# Patient Record
Sex: Female | Born: 1960
Health system: Southern US, Community
[De-identification: ages and names within clinical notes are randomized; demographics above are authoritative.]

## PROBLEM LIST (undated history)

## (undated) DIAGNOSIS — T7840XA Allergy, unspecified, initial encounter: Secondary | ICD-10-CM

## (undated) DIAGNOSIS — H269 Unspecified cataract: Secondary | ICD-10-CM

## (undated) DIAGNOSIS — E785 Hyperlipidemia, unspecified: Secondary | ICD-10-CM

## (undated) DIAGNOSIS — G43D0 Abdominal migraine, not intractable: Secondary | ICD-10-CM

## (undated) DIAGNOSIS — M1990 Unspecified osteoarthritis, unspecified site: Secondary | ICD-10-CM

## (undated) DIAGNOSIS — R7303 Prediabetes: Secondary | ICD-10-CM

## (undated) DIAGNOSIS — D649 Anemia, unspecified: Secondary | ICD-10-CM

## (undated) DIAGNOSIS — G43909 Migraine, unspecified, not intractable, without status migrainosus: Secondary | ICD-10-CM

## (undated) DIAGNOSIS — M7710 Lateral epicondylitis, unspecified elbow: Secondary | ICD-10-CM

## (undated) DIAGNOSIS — Z889 Allergy status to unspecified drugs, medicaments and biological substances status: Secondary | ICD-10-CM

## (undated) DIAGNOSIS — H8103 Meniere's disease, bilateral: Secondary | ICD-10-CM

## (undated) DIAGNOSIS — K219 Gastro-esophageal reflux disease without esophagitis: Secondary | ICD-10-CM

## (undated) DIAGNOSIS — R011 Cardiac murmur, unspecified: Secondary | ICD-10-CM

## (undated) HISTORY — DX: Anemia, unspecified: D64.9

## (undated) HISTORY — PX: BREAST CYST EXCISION: SHX579

## (undated) HISTORY — DX: Gastro-esophageal reflux disease without esophagitis: K21.9

## (undated) HISTORY — PX: COLONOSCOPY: SHX174

## (undated) HISTORY — DX: Unspecified osteoarthritis, unspecified site: M19.90

## (undated) HISTORY — DX: Abdominal migraine, not intractable: G43.D0

## (undated) HISTORY — DX: Lateral epicondylitis, unspecified elbow: M77.10

## (undated) HISTORY — DX: Prediabetes: R73.03

## (undated) HISTORY — DX: Migraine, unspecified, not intractable, without status migrainosus: G43.909

## (undated) HISTORY — DX: Hyperlipidemia, unspecified: E78.5

## (undated) HISTORY — DX: Meniere's disease, bilateral: H81.03

## (undated) HISTORY — DX: Allergy, unspecified, initial encounter: T78.40XA

## (undated) HISTORY — DX: Cardiac murmur, unspecified: R01.1

## (undated) HISTORY — DX: Allergy status to unspecified drugs, medicaments and biological substances: Z88.9

## (undated) HISTORY — DX: Unspecified cataract: H26.9

---

## 1992-01-09 HISTORY — PX: BREAST BIOPSY: SHX20

## 1997-07-27 ENCOUNTER — Other Ambulatory Visit: Admission: RE | Admit: 1997-07-27 | Discharge: 1997-07-27 | Payer: Self-pay | Admitting: Obstetrics & Gynecology

## 1998-08-23 ENCOUNTER — Other Ambulatory Visit: Admission: RE | Admit: 1998-08-23 | Discharge: 1998-08-23 | Payer: Self-pay | Admitting: Obstetrics & Gynecology

## 1999-08-29 ENCOUNTER — Other Ambulatory Visit: Admission: RE | Admit: 1999-08-29 | Discharge: 1999-08-29 | Payer: Self-pay | Admitting: Obstetrics & Gynecology

## 2000-08-28 ENCOUNTER — Other Ambulatory Visit: Admission: RE | Admit: 2000-08-28 | Discharge: 2000-08-28 | Payer: Self-pay | Admitting: Obstetrics & Gynecology

## 2001-09-01 ENCOUNTER — Other Ambulatory Visit: Admission: RE | Admit: 2001-09-01 | Discharge: 2001-09-01 | Payer: Self-pay | Admitting: Obstetrics & Gynecology

## 2002-09-15 ENCOUNTER — Other Ambulatory Visit: Admission: RE | Admit: 2002-09-15 | Discharge: 2002-09-15 | Payer: Self-pay | Admitting: Obstetrics & Gynecology

## 2003-09-16 ENCOUNTER — Other Ambulatory Visit: Admission: RE | Admit: 2003-09-16 | Discharge: 2003-09-16 | Payer: Self-pay | Admitting: Obstetrics & Gynecology

## 2004-06-06 ENCOUNTER — Emergency Department (HOSPITAL_COMMUNITY): Admission: EM | Admit: 2004-06-06 | Discharge: 2004-06-06 | Payer: Self-pay | Admitting: *Deleted

## 2004-06-08 ENCOUNTER — Ambulatory Visit: Payer: Self-pay | Admitting: Internal Medicine

## 2004-06-13 ENCOUNTER — Ambulatory Visit: Payer: Self-pay | Admitting: Internal Medicine

## 2004-06-14 ENCOUNTER — Encounter: Admission: RE | Admit: 2004-06-14 | Discharge: 2004-06-14 | Payer: Self-pay | Admitting: Internal Medicine

## 2004-09-19 ENCOUNTER — Other Ambulatory Visit: Admission: RE | Admit: 2004-09-19 | Discharge: 2004-09-19 | Payer: Self-pay | Admitting: Obstetrics & Gynecology

## 2005-03-07 ENCOUNTER — Ambulatory Visit: Payer: Self-pay | Admitting: Internal Medicine

## 2005-11-14 ENCOUNTER — Ambulatory Visit: Payer: Self-pay | Admitting: Internal Medicine

## 2006-02-26 ENCOUNTER — Ambulatory Visit: Payer: Self-pay | Admitting: Internal Medicine

## 2006-04-09 ENCOUNTER — Ambulatory Visit: Payer: Self-pay | Admitting: Internal Medicine

## 2006-08-20 DIAGNOSIS — J309 Allergic rhinitis, unspecified: Secondary | ICD-10-CM | POA: Insufficient documentation

## 2006-08-20 DIAGNOSIS — E785 Hyperlipidemia, unspecified: Secondary | ICD-10-CM | POA: Insufficient documentation

## 2006-10-11 ENCOUNTER — Ambulatory Visit: Payer: Self-pay | Admitting: Internal Medicine

## 2007-01-13 ENCOUNTER — Telehealth: Payer: Self-pay | Admitting: Internal Medicine

## 2008-06-10 ENCOUNTER — Ambulatory Visit: Payer: Self-pay | Admitting: Family Medicine

## 2008-06-10 LAB — CONVERTED CEMR LAB
Specific Gravity, Urine: 1.02
WBC Urine, dipstick: NEGATIVE
pH: 6

## 2008-06-11 ENCOUNTER — Ambulatory Visit: Payer: Self-pay | Admitting: Cardiology

## 2008-06-11 LAB — CONVERTED CEMR LAB
Eosinophils Relative: 1.9 % (ref 0.0–5.0)
HCT: 37.1 % (ref 36.0–46.0)
Lymphocytes Relative: 31.6 % (ref 12.0–46.0)
MCV: 90.6 fL (ref 78.0–100.0)
Monocytes Relative: 5.5 % (ref 3.0–12.0)
Neutrophils Relative %: 60.4 % (ref 43.0–77.0)
Platelets: 214 10*3/uL (ref 150.0–400.0)
RDW: 12.5 % (ref 11.5–14.6)

## 2008-06-18 ENCOUNTER — Ambulatory Visit: Payer: Self-pay | Admitting: Internal Medicine

## 2008-08-09 ENCOUNTER — Ambulatory Visit: Payer: Self-pay | Admitting: Family Medicine

## 2008-08-09 DIAGNOSIS — F4323 Adjustment disorder with mixed anxiety and depressed mood: Secondary | ICD-10-CM | POA: Insufficient documentation

## 2008-10-07 LAB — CONVERTED CEMR LAB: Pap Smear: NORMAL

## 2008-10-17 ENCOUNTER — Telehealth: Payer: Self-pay | Admitting: Family Medicine

## 2008-10-27 ENCOUNTER — Ambulatory Visit: Payer: Self-pay | Admitting: Internal Medicine

## 2008-10-27 LAB — CONVERTED CEMR LAB
ALT: 21 units/L (ref 0–35)
AST: 20 units/L (ref 0–37)
Albumin: 3.8 g/dL (ref 3.5–5.2)
CO2: 28 meq/L (ref 19–32)
Calcium: 9 mg/dL (ref 8.4–10.5)
Chloride: 102 meq/L (ref 96–112)
Cholesterol: 255 mg/dL — ABNORMAL HIGH (ref 0–200)
Eosinophils Absolute: 0.2 10*3/uL (ref 0.0–0.7)
Glucose, Bld: 83 mg/dL (ref 70–99)
Glucose, Urine, Semiquant: NEGATIVE
HCT: 38.8 % (ref 36.0–46.0)
MCHC: 33.8 g/dL (ref 30.0–36.0)
MCV: 92.4 fL (ref 78.0–100.0)
Monocytes Absolute: 0.5 10*3/uL (ref 0.1–1.0)
Nitrite: NEGATIVE
Platelets: 286 10*3/uL (ref 150.0–400.0)
RBC: 4.2 M/uL (ref 3.87–5.11)
RDW: 12.3 % (ref 11.5–14.6)
Specific Gravity, Urine: 1.015
TSH: 1.22 microintl units/mL (ref 0.35–5.50)
Urobilinogen, UA: 0.2
WBC Urine, dipstick: NEGATIVE
WBC: 7.8 10*3/uL (ref 4.5–10.5)

## 2008-11-03 ENCOUNTER — Ambulatory Visit: Payer: Self-pay | Admitting: Internal Medicine

## 2009-03-09 ENCOUNTER — Ambulatory Visit: Payer: Self-pay | Admitting: Internal Medicine

## 2009-03-09 LAB — CONVERTED CEMR LAB
ALT: 23 units/L (ref 0–35)
AST: 25 units/L (ref 0–37)
HDL: 67.8 mg/dL (ref >39.00)
Total Bilirubin: 0.5 mg/dL (ref 0.3–1.2)
VLDL: 49.4 mg/dL — ABNORMAL HIGH (ref 0.0–40.0)

## 2009-03-16 ENCOUNTER — Ambulatory Visit: Payer: Self-pay | Admitting: Internal Medicine

## 2009-05-11 ENCOUNTER — Telehealth: Payer: Self-pay | Admitting: Internal Medicine

## 2009-05-11 DIAGNOSIS — M545 Low back pain, unspecified: Secondary | ICD-10-CM | POA: Insufficient documentation

## 2010-02-07 NOTE — Assessment & Plan Note (Signed)
Summary: ROA X 3 MTHS / RS/PT RSC/CJR   Vital Signs:  Patient profile:   50 year old female Height:      68 inches Weight:      196 pounds BMI:     29.91 Temp:     98.2 degrees F oral Pulse rate:   72 / minute Resp:     12 per minute BP sitting:   116 / 78  (left arm)  Vitals Entered By: Willy Eddy, LPN (March 16, 452 9:51 AM)  Nutrition Counseling: Patient's BMI is greater than 25 and therefore counseled on weight management options. CC: roa labs after starting livalo 4 mg   CC:  roa labs after starting livalo 4 mg.  History of Present Illness: discussion of bmi GOAL DIET AND EXERCIZE GOAL I have spent greater that 30 min face to face evaluating this patient    Hyperlipidemia Follow-Up      This is a 50 year old woman who presents for Hyperlipidemia follow-up.  The patient denies muscle aches, GI upset, abdominal pain, flushing, itching, constipation, diarrhea, and fatigue.  The patient denies the following symptoms: chest pain/pressure, exercise intolerance, dypsnea, palpitations, syncope, and pedal edema.  Compliance with medications (by patient report) has been near 100%.  Dietary compliance has been excellent.  The patient reports exercising daily.    Preventive Screening-Counseling & Management  Alcohol-Tobacco     Smoking Status: quit  Problems Prior to Update: 1)  Preventive Health Care  (ICD-V70.0) 2)  Family History Breast Cancer 1st Degree Relative <50  (ICD-V16.3) 3)  Anxiety, Situational  (ICD-308.3) 4)  Abdominal Pain, Right Lower Quadrant  (ICD-789.03) 5)  Hyperlipidemia  (ICD-272.4) 6)  Allergic Rhinitis  (ICD-477.9)  Medications Prior to Update: 1)  Claritin 10 Mg  Tabs (Loratadine) .... Take 1 Tablet By Mouth Once A Day 2)  Ortho Tri-Cyclen Lo 0.18/0.215/0.25 Mg-25 Mcg Tabs (Norgestim-Eth Estrad Triphasic) .... Use As Directed 3)  Protopic 0.1 % Oint (Tacrolimus) .... Use As Directed 4)  Livalo 4 Mg Tabs (Pitavastatin Calcium) .... One By  Mouth Every Friday Night  Current Medications (verified): 1)  Claritin 10 Mg  Tabs (Loratadine) .... Take 1 Tablet By Mouth Once A Day 2)  Ortho Tri-Cyclen Lo 0.18/0.215/0.25 Mg-25 Mcg Tabs (Norgestim-Eth Estrad Triphasic) .... Use As Directed 3)  Protopic 0.1 % Oint (Tacrolimus) .... Use As Directed 4)  Livalo 4 Mg Tabs (Pitavastatin Calcium) .... One By Mouth Every Friday Night 5)  Womens Multivitamin Plus  Tabs (Multiple Vitamins-Minerals) .Marland Kitchen.. 1 Once Daily 6)  Aspir-Low 81 Mg Tbec (Aspirin) .Marland Kitchen.. 1 Once Daily  Allergies (verified): No Known Drug Allergies  Past History:  Family History: Last updated: November 30, 2008 father died of Cancer mother and Kris Mouton mothere has breast cancer Family History Breast cancer 1st degree relative <50 Family History High cholesterol Family Hsitory Headaches  Social History: Last updated: Nov 30, 2008 Former Smoker  quit Jan 09 1995 Regular exercise-yes  Risk Factors: Exercise: yes (11-30-2008)  Risk Factors: Smoking Status: quit (03/16/2009)  Past medical, surgical, family and social histories (including risk factors) reviewed, and no changes noted (except as noted below).  Past Medical History: Reviewed history from 08/20/2006 and no changes required. Allergic rhinitis Hyperlipidemia  Family History: Reviewed history from 30-Nov-2008 and no changes required. father died of Cancer mother and Cyril Mourning has breast cancer Family History Breast cancer 1st degree relative <50 Family History High cholesterol Family Hsitory Headaches  Social History: Reviewed history from Nov 30, 2008 and no changes  required. Former Smoker  quit Jan 09 1995 Regular exercise-yes  Review of Systems  The patient denies anorexia, fever, weight loss, weight gain, vision loss, decreased hearing, hoarseness, chest pain, syncope, dyspnea on exertion, peripheral edema, prolonged cough, headaches, hemoptysis, abdominal pain, melena, hematochezia, severe  indigestion/heartburn, hematuria, incontinence, genital sores, muscle weakness, suspicious skin lesions, transient blindness, difficulty walking, depression, unusual weight change, abnormal bleeding, enlarged lymph nodes, angioedema, and breast masses.    Physical Exam  General:  alert, well-developed, and overweight-appearing.   Head:  normocephalic and atraumatic.  normocephalic and no abnormalities observed.   Eyes:  pupils equal and pupils round.   Ears:  R ear normal and L ear normal.   Nose:  no external deformity and no nasal discharge.   Mouth:  pharynx pink and moist and no erythema.   Neck:  No deformities, masses, or tenderness noted. Lungs:  Normal respiratory effort, chest expands symmetrically. Lungs are clear to auscultation, no crackles or wheezes. Heart:  Normal rate and regular rhythm. S1 and S2 normal without gallop, murmur, click, rub or other extra sounds. Abdomen:  tender along the area of the rectus abdominus and the seratus with palpable tenderness PERSISTANT Msk:  No deformity or scoliosis noted of thoracic or lumbar spine.   Pulses:  R and L carotid,radial,femoral,dorsalis pedis and posterior tibial pulses are full and equal bilaterally Extremities:  No clubbing, cyanosis, edema, or deformity noted with normal full range of motion of all joints.   Neurologic:  No cranial nerve deficits noted. Station and gait are normal. Plantar reflexes are down-going bilaterally. DTRs are symmetrical throughout. Sensory, motor and coordinative functions appear intact.   Impression & Recommendations:  Problem # 1:  HYPERLIPIDEMIA (ICD-272.4) I have spent greater that 30 min face to face evaluating this patient SET GOALS FOR WEIGHT, LIPIDS AND DICUSSED THE CONTINUATION OF THE LIVALO UNTIL THIS GOALS ARE REACHED Her updated medication list for this problem includes:    Livalo 4 Mg Tabs (Pitavastatin calcium) ..... One by mouth every friday night  Labs Reviewed: SGOT: 25  (03/09/2009)   SGPT: 23 (03/09/2009)   HDL:67.80 (03/09/2009), 48.90 (10/27/2008)  Chol:230 (03/09/2009), 255 (10/27/2008)  Trig:247.0 (03/09/2009), 296.0 (10/27/2008)  Problem # 2:  ABDOMINAL PAIN, RIGHT LOWER QUADRANT (ICD-789.03)  MODERATE  DIRECT HERNIA  Discussed symptom control with the patient.   Complete Medication List: 1)  Claritin 10 Mg Tabs (Loratadine) .... Take 1 tablet by mouth once a day 2)  Ortho Tri-cyclen Lo 0.18/0.215/0.25 Mg-25 Mcg Tabs (Norgestim-eth estrad triphasic) .... Use as directed 3)  Protopic 0.1 % Oint (Tacrolimus) .... Use as directed 4)  Livalo 4 Mg Tabs (Pitavastatin calcium) .... One by mouth every friday night 5)  Womens Multivitamin Plus Tabs (Multiple vitamins-minerals) .Marland Kitchen.. 1 once daily 6)  Aspir-low 81 Mg Tbec (Aspirin) .Marland Kitchen.. 1 once daily  Patient Instructions: 1)  Please schedule a follow-up appointment in 3 months. 2)  Hepatic Panel prior to visit, ICD-9:995.20 3)  Lipid Panel prior to visit, ICD-9:272.4

## 2010-02-07 NOTE — Progress Notes (Signed)
Summary: Pt having possible side effects from Livalo  Phone Note Call from Patient Call back at Home Phone [phone]   Caller: Patient Reason for Call: Lab or Test Results Summary of Call: Pt wants a call back to discuss potential side effects of Livalo. Pt is having some muscle soreness.  Please call asap today.  Initial call taken by: Lucy Antigua,  May 11, 2009 9:29 AM  Follow-up for Phone Call        Livilo 4 mg on Fridays.......is having muscle soreness (upper thighs).  Lower back is painful at times.  Occ soreness in arms and legs.  Stopped Livilo 2 weeks ago, but is no better. Follow-up by: Lynann Beaver CMA,  May 11, 2009 9:43 AM  Additional Follow-up for Phone Call Additional follow up Details #1::        per dr Lovell Sheehan- if she stopped 2 weeks ago , and still has symptoms, it is not the livila so she can start back- send for ls spine xrays and may have flexeril 10 1 two times a day as needed muscle spasm #30 Additional Follow-up by: Willy Eddy, LPN,  May 12, 2954 9:59 AM  New Problems: BACK PAIN, LUMBAR (ICD-724.2)   New Problems: BACK PAIN, LUMBAR (ICD-724.2) New/Updated Medications: FLEXERIL 10 MG TABS (CYCLOBENZAPRINE HCL) one by mouth two times a day as needed muscle pain Prescriptions: FLEXERIL 10 MG TABS (CYCLOBENZAPRINE HCL) one by mouth two times a day as needed muscle pain  #30 x 0   Entered by:   Lynann Beaver CMA   Authorized by:   Stacie Glaze MD   Signed by:   Lynann Beaver CMA on 05/11/2009   Method used:   Electronically to        [id number] (retail)       [address]       [place], [place]  [address]       Ph: [phone] or [phone]       Fax: [phone]   RxID:   [id number]  Pt notified.

## 2010-02-07 NOTE — Progress Notes (Signed)
Summary: flexeril  Phone Note Call from Patient   Caller: Patient Call For: Stacie Glaze MD Summary of Call: Pt wants to know if she can try the Flexeril and see if it helps before the xrays. [phone] Initial call taken by: Lynann Beaver CMA,  May 11, 2009 11:53 AM  Follow-up for Phone Call        yes - may try flexeril first but let us know if she wants [place] to schedule ls spine xray again per dr Lovell Sheehan Follow-up by: Willy Eddy, LPN,  May 12, 4538 12:13 PM  Additional Follow-up for Phone Call Additional follow up Details #1::        Phone Call Completed Additional Follow-up by: Rudy Jew, RN,  May 11, 2009 12:16 PM

## 2010-02-17 ENCOUNTER — Other Ambulatory Visit: Payer: Self-pay

## 2010-02-24 ENCOUNTER — Encounter: Payer: Self-pay | Admitting: Internal Medicine

## 2011-03-23 ENCOUNTER — Ambulatory Visit: Payer: Self-pay | Admitting: Licensed Clinical Social Worker

## 2011-06-28 ENCOUNTER — Ambulatory Visit (INDEPENDENT_AMBULATORY_CARE_PROVIDER_SITE_OTHER): Payer: BC Managed Care – PPO | Admitting: Family Medicine

## 2011-06-28 ENCOUNTER — Encounter: Payer: Self-pay | Admitting: Family Medicine

## 2011-06-28 VITALS — BP 120/70 | Temp 98.5°F | Wt 211.0 lb

## 2011-06-28 DIAGNOSIS — G44209 Tension-type headache, unspecified, not intractable: Secondary | ICD-10-CM

## 2011-06-28 MED ORDER — CYCLOBENZAPRINE HCL 5 MG PO TABS: 5.0000 mg | ORAL_TABLET | Freq: Three times a day (TID) | ORAL | Status: AC | PRN

## 2011-06-28 NOTE — Progress Notes (Signed)
  Subjective:    Patient ID: Heather Chen, female    DOB: [birth year], 51 y.o.   MRN: [id number]  HPI  Headaches as last Friday. Patient has remote history of migraines but this headache is different. Location is frontal to occipital and bilateral. Mostly dull ache. Mild to moderate severity. Generally sleeping okay. Denies any sinus symptoms. No fever. No focal neurologic symptoms. She's taken Advil without much relief. Headaches generally built through the day. She denies any specific new stressors.  No past medical history on file. No past surgical history on file.  reports that she quit smoking about 16 years ago. Her smoking use included Cigarettes. She has a 3 pack-year smoking history. She does not have any smokeless tobacco history on file. Her alcohol and drug histories not on file. family history is not on file. Allergies  Allergen Reactions  . Neomycin     rash      Review of Systems  Constitutional: Negative for fever, chills, appetite change and unexpected weight change.  Neurological: Positive for headaches. Negative for dizziness, seizures, syncope and weakness.  Hematological: Negative for adenopathy.  Psychiatric/Behavioral: Negative for confusion.       Objective:   Physical Exam  Constitutional: She is oriented to person, place, and time. She appears well-developed and well-nourished.  HENT:  Head: Normocephalic and atraumatic.  Right Ear: External ear normal.  Left Ear: External ear normal.  Neck: Neck supple. No thyromegaly present.  Cardiovascular: Normal rate and regular rhythm.   Pulmonary/Chest: Effort normal and breath sounds normal. No respiratory distress. She has no wheezes. She has no rales.  Lymphadenopathy:    She has no cervical adenopathy.  Neurological: She is alert and oriented to person, place, and time. No cranial nerve deficit. Coordination normal.  Psychiatric: She has a normal mood and affect. Her behavior is normal.            Assessment & Plan:  Headache. Suspect muscle contraction headache. Aerobic exercise as tolerated. Consider alternating heat and ice neck and upper back muscles. Local muscle massage. Low-dose cyclobenzaprine 5 mg each bedtime and follow up with primary in 1-2 weeks if no better and sooner as needed

## 2011-06-28 NOTE — Patient Instructions (Addendum)
Tension Headache (Muscle Contraction Headache) Tension headache is one of the most common causes of head pain. These headaches are usually felt as a pain over the top of your head and back of your neck. Stress, anxiety, and depression are common triggers for these headaches. Tension headaches are not life-threatening and will not lead to other types of headaches. Tension headaches can often be diagnosed by taking a history from the patient and a physical exam. Sometimes, further lab and x-ray studies are used to confirm the diagnosis. Your caregiver can advise you on how to get help solving problems that cause anxiety or stress. Antidepressants can be prescribed if depression is a problem. HOME CARE INSTRUCTIONS   If testing was done, call for your results. Remember, it is your responsibility to get the results of all testing. Do not assume everything is fine because you do not hear from your caregiver.   Only take over-the-counter or prescription medicines for pain, discomfort, or fever as directed by your caregiver.   Biofeedback, massage, or other relaxation techniques may be helpful.   Ice packs or heat to the head and neck can be used. Use these three to four times per day or as needed.   Physical therapy may be a useful addition to treatment.   If headaches continue, even with therapy, you may need to think about lifestyle changes.   Avoid excessive use of pain killers, as rebound headaches can occur.  SEEK MEDICAL CARE IF:   You develop problems with medications prescribed.   You do not respond or get no relief from medications.   You have a change from the usual headache.   You develop nausea (feeling sick to your stomach) or vomiting.  SEEK IMMEDIATE MEDICAL CARE IF:   Your headache becomes severe.   You have an unexplained oral temperature above 102 F (38.9 C).   You develop a stiff neck.   You have loss of vision.   You have muscular weakness.   You have loss of  muscular control.   You develop severe symptoms different from your first symptoms.   You start losing your balance or have trouble walking.   You feel faint or pass out.  MAKE SURE YOU:   Understand these instructions.   Will watch your condition.   Will get help right away if you are not doing well or get worse.  Document Released: 12/25/2004 Document Revised: 12/14/2010 Document Reviewed: 08/14/2007 Exit[employer] Patient Information 2012 [employer]. 

## 2012-01-28 ENCOUNTER — Other Ambulatory Visit: Payer: Self-pay | Admitting: Internal Medicine

## 2012-01-28 ENCOUNTER — Telehealth: Payer: Self-pay | Admitting: Internal Medicine

## 2012-01-28 DIAGNOSIS — Z139 Encounter for screening, unspecified: Secondary | ICD-10-CM

## 2012-01-28 NOTE — Telephone Encounter (Signed)
Left message on machine Will send for colonoscopy (screening )--and dr Lovell Sheehan has closed his practice to new pts

## 2012-01-28 NOTE — Telephone Encounter (Signed)
Pt requesting:  1. Order for colonoscopy 2. Would like to know if Dr. Lovell Sheehan would accept her husband [name] as a new patient.

## 2012-01-30 ENCOUNTER — Encounter: Payer: Self-pay | Admitting: Internal Medicine

## 2012-03-17 ENCOUNTER — Telehealth: Payer: Self-pay | Admitting: Internal Medicine

## 2012-03-17 NOTE — Telephone Encounter (Signed)
Pt would like cpx before aug 2014. Pt would like to see Orvan Falconer for cpx if possible.Can I sch?

## 2012-03-17 NOTE — Telephone Encounter (Signed)
Reschedule her physical with  [facility]

## 2012-03-17 NOTE — Telephone Encounter (Signed)
Ok with me 

## 2012-03-18 NOTE — Telephone Encounter (Signed)
Pt is sch for cpx on 03-27-12 and cpx labs on 03-20-12

## 2012-03-19 ENCOUNTER — Emergency Department (INDEPENDENT_AMBULATORY_CARE_PROVIDER_SITE_OTHER)
Admission: EM | Admit: 2012-03-19 | Discharge: 2012-03-19 | Disposition: A | Discharge status: Home / Self Care | Payer: BC Managed Care – PPO | Source: Home / Self Care

## 2012-03-19 ENCOUNTER — Ambulatory Visit: Payer: BC Managed Care – PPO | Admitting: Family

## 2012-03-19 ENCOUNTER — Encounter (HOSPITAL_COMMUNITY): Payer: Self-pay | Admitting: *Deleted

## 2012-03-19 DIAGNOSIS — M7700 Medial epicondylitis, unspecified elbow: Secondary | ICD-10-CM

## 2012-03-19 DIAGNOSIS — M7710 Lateral epicondylitis, unspecified elbow: Secondary | ICD-10-CM

## 2012-03-19 DIAGNOSIS — G5601 Carpal tunnel syndrome, right upper limb: Secondary | ICD-10-CM

## 2012-03-19 DIAGNOSIS — M7701 Medial epicondylitis, right elbow: Secondary | ICD-10-CM

## 2012-03-19 DIAGNOSIS — G5600 Carpal tunnel syndrome, unspecified upper limb: Secondary | ICD-10-CM

## 2012-03-19 DIAGNOSIS — M7711 Lateral epicondylitis, right elbow: Secondary | ICD-10-CM

## 2012-03-19 MED ORDER — METHYLPREDNISOLONE 4 MG PO KIT: PACK | ORAL | Status: DC

## 2012-03-19 NOTE — ED Notes (Signed)
Pt  Has  A  History  Of her  r  Arm   Having  Some  Weakness      And   inabiblity  To  strrongly  Grip a  Glass   She  denys  Any  specefic  Injury        She     Is  Awake  Alert  And oriented  She  Uses computer a  Lot              She  Is  In no  Acute  distres  Speech is  clar  No facial  Droop

## 2012-03-19 NOTE — ED Provider Notes (Signed)
Medical screening examination/treatment/procedure(s) were performed by non-physician practitioner and as supervising physician I was immediately available for consultation/collaboration.  Leslee Home, M.D.  Reuben Likes, MD 03/19/12 2059

## 2012-03-19 NOTE — ED Provider Notes (Signed)
That in her History     CSN: [id number]  Arrival date & time 03/19/12  1051   First MD Initiated Contact with Patient 03/19/12 1158      Chief Complaint  Patient presents with  . Extremity Weakness    (Consider location/radiation/quality/duration/timing/severity/associated sxs/prior treatment) HPI Comments: 52 year old female complaining of right arm pain associated with weakness and numbness off and on for 2 years. Pain is worse with attempting to lift objects and numbness occurs when working out at the [employer] wore on the computer. Numbness is located primarily in the digits. Denies trauma or injury. The left arm is unaffected. No other extremities are affected.  The history is provided by the patient.    History reviewed. No pertinent past medical history.  History reviewed. No pertinent past surgical history.  No family history on file.  History  Substance Use Topics  . Smoking status: Never Smoker   . Smokeless tobacco: Not on file  . Alcohol Use: Yes    OB History   Grav Para Term Preterm Abortions TAB SAB Ect Mult Living                  Review of Systems  Constitutional: Negative for fever, chills and activity change.  HENT: Negative.   Respiratory: Negative.   Cardiovascular: Negative.   Genitourinary: Negative.   Musculoskeletal:       As per HPI  Skin: Negative.  Negative for color change, pallor and rash.  Neurological: Negative.   Psychiatric/Behavioral: Negative.     Allergies  Neomycin  Home Medications   Current Outpatient Rx  Name  Route  Sig  Dispense  Refill  . loratadine (CLARITIN) 10 MG tablet   Oral   Take 10 mg by mouth daily.         . methylPREDNISolone (MEDROL DOSEPAK) 4 MG tablet      follow package directions   21 tablet   0     BP 137/84  Pulse 78  Temp(Src) 98.7 F (37.1 C) (Oral)  Resp 16  SpO2 98%  LMP 02/27/2012  Physical Exam  Constitutional: She is oriented to person, place, and time. She appears  well-developed and well-nourished. No distress.  HENT:  Head: Normocephalic and atraumatic.  Eyes: EOM are normal. Pupils are equal, round, and reactive to light.  Neck: Normal range of motion. Neck supple.  Pulmonary/Chest: Effort normal. No respiratory distress.  Musculoskeletal:  Direct tenderness over the lateral and medial right epicondyles. Elbow and proximal forearm discomfort with wrist extension against resistance. Positive Phalen's sign. No bony tenderness. No swelling or skin discoloration. Negative Tinel's. Radial pulse 2+ normal color and warmth distally.  Neurological: She is alert and oriented to person, place, and time. No cranial nerve deficit.  Skin: Skin is warm and dry.  Psychiatric: She has a normal mood and affect.    ED Course  Procedures (including critical care time)  Labs Reviewed - No data to display No results found.   1. Lateral epicondylitis (tennis elbow), right   2. Medial epicondylitis of right elbow   3. Carpal tunnel syndrome, right       MDM  Patient is given instructions regarding ergonomics associated with exercise and resistance training. Also associated with the frequent use of the computer. This was 20 minutes. We will give her a right wrist splint to wear at all times except when sleeping and a arm sling to wear as much as possible to rest the elbow. Apply ice before  and after any working out and remember to alleviate those movements that produce pain Medrol Dosepak for one week. Follow with your doctor as needed. Call for appointment.  Hayden Rasmussen, NP 03/19/12 1258

## 2012-03-20 ENCOUNTER — Other Ambulatory Visit (INDEPENDENT_AMBULATORY_CARE_PROVIDER_SITE_OTHER): Payer: BC Managed Care – PPO

## 2012-03-20 DIAGNOSIS — Z0000 Encounter for general adult medical examination without abnormal findings: Secondary | ICD-10-CM

## 2012-03-20 LAB — HEPATIC FUNCTION PANEL
ALT: 21 U/L (ref 0–35)
AST: 21 U/L (ref 0–37)
Alkaline Phosphatase: 48 U/L (ref 39–117)
Bilirubin, Direct: 0 mg/dL (ref 0.0–0.3)
Total Bilirubin: 0.6 mg/dL (ref 0.3–1.2)

## 2012-03-20 LAB — BASIC METABOLIC PANEL
BUN: 15 mg/dL (ref 6–23)
Calcium: 8.9 mg/dL (ref 8.4–10.5)
Chloride: 105 mEq/L (ref 96–112)
GFR: 69.35 mL/min (ref >60.00)
Glucose, Bld: 89 mg/dL (ref 70–99)
Sodium: 139 mEq/L (ref 135–145)

## 2012-03-20 LAB — CBC WITH DIFFERENTIAL/PLATELET
Basophils Absolute: 0 10*3/uL (ref 0.0–0.1)
HCT: 38.9 % (ref 36.0–46.0)
Lymphs Abs: 2.1 10*3/uL (ref 0.7–4.0)
MCHC: 34.4 g/dL (ref 30.0–36.0)
Monocytes Absolute: 0.3 10*3/uL (ref 0.1–1.0)
Monocytes Relative: 5.5 % (ref 3.0–12.0)
Platelets: 266 10*3/uL (ref 150.0–400.0)
RDW: 13.5 % (ref 11.5–14.6)
WBC: 5.8 10*3/uL (ref 4.5–10.5)

## 2012-03-20 LAB — LIPID PANEL
Triglycerides: 175 mg/dL — ABNORMAL HIGH (ref 0.0–149.0)
VLDL: 35 mg/dL (ref 0.0–40.0)

## 2012-03-20 LAB — POCT URINALYSIS DIPSTICK
Glucose, UA: NEGATIVE
Urobilinogen, UA: 0.2

## 2012-03-20 LAB — TSH: TSH: 0.39 u[IU]/mL (ref 0.35–5.50)

## 2012-03-26 ENCOUNTER — Ambulatory Visit (AMBULATORY_SURGERY_CENTER): Payer: BC Managed Care – PPO | Admitting: *Deleted

## 2012-03-26 ENCOUNTER — Encounter: Payer: Self-pay | Admitting: Internal Medicine

## 2012-03-26 VITALS — Ht 70.0 in | Wt 218.4 lb

## 2012-03-26 DIAGNOSIS — Z1211 Encounter for screening for malignant neoplasm of colon: Secondary | ICD-10-CM

## 2012-03-26 MED ORDER — NA SULFATE-K SULFATE-MG SULF 17.5-3.13-1.6 GM/177ML PO SOLN: ORAL | Status: DC

## 2012-03-27 ENCOUNTER — Ambulatory Visit (INDEPENDENT_AMBULATORY_CARE_PROVIDER_SITE_OTHER): Payer: BC Managed Care – PPO | Admitting: Family

## 2012-03-27 ENCOUNTER — Encounter: Payer: Self-pay | Admitting: Family

## 2012-03-27 VITALS — BP 120/84 | HR 98 | Ht 70.0 in | Wt 216.0 lb

## 2012-03-27 DIAGNOSIS — Z0000 Encounter for general adult medical examination without abnormal findings: Secondary | ICD-10-CM

## 2012-03-27 DIAGNOSIS — E7800 Pure hypercholesterolemia, unspecified: Secondary | ICD-10-CM

## 2012-03-27 NOTE — Patient Instructions (Signed)

## 2012-03-27 NOTE — Progress Notes (Signed)
Subjective:    Patient ID: Heather Chen, female    DOB: 15-Dec-1960, 52 y.o.   MRN: [id number]  HPI  This is a routine physical examination for this healthy  Female. Reviewed all health maintenance protocols including mammography colonoscopy bone density and reviewed appropriate screening labs. Her immunization history was reviewed as well as her current medications and allergies refills of her chronic medications were given and the plan for yearly health maintenance was discussed all orders and referrals were made as appropriate.   Review of Systems  Constitutional: Negative.   HENT: Negative.   Eyes: Negative.   Respiratory: Negative.   Cardiovascular: Negative.   Gastrointestinal: Negative.   Endocrine: Negative.   Genitourinary: Negative.   Musculoskeletal: Negative.   Skin: Negative.   Allergic/Immunologic: Negative.   Neurological: Negative.   Hematological: Negative.    Past Medical History  Diagnosis Date  . H/O seasonal allergies   . Tennis elbow     History   Social History  . Marital Status: Married    Spouse Name: N/A    Number of Children: N/A  . Years of Education: N/A   Occupational History  . Not on file.   Social History Main Topics  . Smoking status: Former Smoker -- 0.30 packs/day for 10 years    Types: Cigarettes    Quit date: 01/28/1995  . Smokeless tobacco: Not on file  . Alcohol Use: 3.0 oz/week    5 Cans of beer per week  . Drug Use: No  . Sexually Active: Not on file   Other Topics Concern  . Not on file   Social History Narrative  . No narrative on file    Past Surgical History  Procedure Laterality Date  . Breast biopsy  1994    right; benign    Family History  Problem Relation Age of Onset  . Colon cancer Neg Hx     Allergies  Allergen Reactions  . Neomycin     rash    Current Outpatient Prescriptions on File Prior to Visit  Medication Sig Dispense Refill  . aspirin 81 MG tablet Take 81 mg by mouth daily.       . Cetirizine HCl (ZYRTEC PO) Take by mouth daily.      . Multiple Vitamin (MULTIVITAMIN) tablet Take 1 tablet by mouth daily.      . Na Sulfate-K Sulfate-Mg Sulf (SUPREP BOWEL PREP) SOLN suprep as directed.  No substitutions  354 mL  0   No current facility-administered medications on file prior to visit.    BP 120/84  Pulse 98  Ht 5\' 10"  (1.778 m)  Wt 216 lb (97.977 kg)  BMI 30.99 kg/m2  SpO2 98%  LMP 03/13/2014chart    Objective:   Physical Exam  Constitutional: She is oriented to person, place, and time. She appears well-developed and well-nourished.  HENT:  Head: Normocephalic and atraumatic.  Right Ear: External ear normal.  Left Ear: External ear normal.  Nose: Nose normal.  Eyes: Conjunctivae are normal. Pupils are equal, round, and reactive to light.  Neck: Neck supple.  Cardiovascular: Normal rate, regular rhythm and normal heart sounds.   Pulmonary/Chest: Effort normal and breath sounds normal.  Abdominal: Soft. Bowel sounds are normal.  Musculoskeletal: Normal range of motion.  Neurological: She is alert and oriented to person, place, and time. She has normal reflexes. No cranial nerve deficit.  Skin: Skin is warm and dry.  Psychiatric: She has a normal mood and affect.  Assessment & Plan:  Assessment:  1. Complete physical exam 2. Hypercholesterolemia  Plan: Encouraged healthy diet, low-cholesterol, exercise, monthly self breast exams. Start Crestor 10 mg once daily. Samples given. Recheck in 6 weeks.

## 2012-04-07 ENCOUNTER — Encounter: Payer: Self-pay | Admitting: Internal Medicine

## 2012-04-07 ENCOUNTER — Ambulatory Visit (AMBULATORY_SURGERY_CENTER): Payer: BC Managed Care – PPO | Admitting: Internal Medicine

## 2012-04-07 VITALS — BP 127/76 | HR 65 | Temp 96.4°F | Resp 27 | Ht 70.0 in | Wt 218.0 lb

## 2012-04-07 DIAGNOSIS — Z1211 Encounter for screening for malignant neoplasm of colon: Secondary | ICD-10-CM

## 2012-04-07 MED ORDER — SODIUM CHLORIDE 0.9 % IV SOLN: 500.0000 mL | INTRAVENOUS | Status: DC

## 2012-04-07 NOTE — Patient Instructions (Addendum)
The colonoscopy was normal! Prep was great also.  Next routine colonoscopy in 10 years.  Thank you for choosing me and [facility].  Iva Boop, MD, [facility]   Discharge instructions given with verbal understanding. Normal exam. Resume previous medications. YOU HAD AN ENDOSCOPIC PROCEDURE TODAY AT THE [facility]: Refer to the procedure report that was given to you for any specific questions about what was found during the examination.  If the procedure report does not answer your questions, please call your gastroenterologist to clarify.  If you requested that your care partner not be given the details of your procedure findings, then the procedure report has been included in a sealed envelope for you to review at your convenience later.  YOU SHOULD EXPECT: Some feelings of bloating in the abdomen. Passage of more gas than usual.  Walking can help get rid of the air that was put into your GI tract during the procedure and reduce the bloating. If you had a lower endoscopy (such as a colonoscopy or flexible sigmoidoscopy) you may notice spotting of blood in your stool or on the toilet paper. If you underwent a bowel prep for your procedure, then you may not have a normal bowel movement for a few days.  DIET: Your first meal following the procedure should be a light meal and then it is ok to progress to your normal diet.  A half-sandwich or bowl of soup is an example of a good first meal.  Heavy or fried foods are harder to digest and may make you feel nauseous or bloated.  Likewise meals heavy in dairy and vegetables can cause extra gas to form and this can also increase the bloating.  Drink plenty of fluids but you should avoid alcoholic beverages for 24 hours.  ACTIVITY: Your care partner should take you home directly after the procedure.  You should plan to take it easy, moving slowly for the rest of the day.  You can resume normal activity the day after the procedure  however you should NOT DRIVE or use heavy machinery for 24 hours (because of the sedation medicines used during the test).    SYMPTOMS TO REPORT IMMEDIATELY: A gastroenterologist can be reached at any hour.  During normal business hours, 8:30 AM to 5:00 PM Monday through Friday, call [phone].  After hours and on weekends, please call the GI answering service at [phone] who will take a message and have the physician on call contact you.   Following lower endoscopy (colonoscopy or flexible sigmoidoscopy):  Excessive amounts of blood in the stool  Significant tenderness or worsening of abdominal pains  Swelling of the abdomen that is new, acute  Fever of 100F or higher  FOLLOW UP: If any biopsies were taken you will be contacted by phone or by letter within the next 1-3 weeks.  Call your gastroenterologist if you have not heard about the biopsies in 3 weeks.  Our staff will call the home number listed on your records the next business day following your procedure to check on you and address any questions or concerns that you may have at that time regarding the information given to you following your procedure. This is a courtesy call and so if there is no answer at the home number and we have not heard from you through the emergency physician on call, we will assume that you have returned to your regular daily activities without incident.  SIGNATURES/CONFIDENTIALITY: You and/or your care partner  have signed paperwork which will be entered into your electronic medical record.  These signatures attest to the fact that that the information above on your After Visit Summary has been reviewed and is understood.  Full responsibility of the confidentiality of this discharge information lies with you and/or your care-partner.

## 2012-04-07 NOTE — Progress Notes (Signed)
Patient did not experience any of the following events: a burn prior to discharge; a fall within the facility; wrong site/side/patient/procedure/implant event; or a hospital transfer or hospital admission upon discharge from the facility. (G8907) Patient did not have preoperative order for IV antibiotic SSI prophylaxis. (G8918)  

## 2012-04-07 NOTE — Op Note (Signed)
[facility] [address] [place], [address]   COLONOSCOPY PROCEDURE REPORT  PATIENT: Heather Chen, Heather Chen  MR#: [id number] BIRTHDATE: Mar 22, 1960 , 51  yrs. old GENDER: Female ENDOSCOPIST: Iva Boop, MD, [facility] REFERRED GE:XBMW Carolynn Sayers, M.D. PROCEDURE DATE:  04/07/2012 PROCEDURE:   Colonoscopy, screening ASA CLASS:   Class I INDICATIONS:average risk screening. MEDICATIONS: propofol (Diprivan) 400mg  IV, MAC sedation, administered by CRNA, and These medications were titrated to patient response per physician's verbal order  DESCRIPTION OF PROCEDURE:   After the risks benefits and alternatives of the procedure were thoroughly explained, informed consent was obtained.  A digital rectal exam revealed no abnormalities of the rectum.   The LB [id number]-[id number]  endoscope was introduced through the anus and advanced to the cecum, which was identified by both the appendix and ileocecal valve. No adverse events experienced.   The quality of the prep was Suprep excellent The instrument was then slowly withdrawn as the colon was fully examined.      COLON FINDINGS: A normal appearing cecum, ileocecal valve, and appendiceal orifice were identified.  The ascending, hepatic flexure, transverse, splenic flexure, descending, sigmoid colon and rectum appeared unremarkable.  No polyps or cancers were seen.   A right colon retroflexion was performed.  Retroflexed views revealed no abnormalities. The time to cecum=2 minutes 45 seconds. Withdrawal time=8 minutes 15 seconds.  The scope was withdrawn and the procedure completed. COMPLICATIONS: There were no complications.  ENDOSCOPIC IMPRESSION: Normal colonoscopy w/excellent prep  RECOMMENDATIONS: Repeat colonoscopy 10 years.   eSigned:  Iva Boop, MD, [facility] 04/07/2012 9:32 AM   cc: Stacie Glaze, MD and The Patient

## 2012-04-08 ENCOUNTER — Telehealth: Payer: Self-pay | Admitting: *Deleted

## 2012-04-08 NOTE — Telephone Encounter (Signed)
  Follow up Call-  Call back number 04/07/2012  Post procedure Call Back phone  # [phone]  Permission to leave phone message Yes     Patient questions:  Do you have a fever, pain , or abdominal swelling? no Pain Score  0 *  Have you tolerated food without any problems? yes  Have you been able to return to your normal activities? yes  Do you have any questions about your discharge instructions: Diet   no Medications  no Follow up visit  no  Do you have questions or concerns about your Care? no  Actions: * If pain score is 4 or above: No action needed, pain <4.

## 2012-05-07 ENCOUNTER — Ambulatory Visit (INDEPENDENT_AMBULATORY_CARE_PROVIDER_SITE_OTHER): Payer: BC Managed Care – PPO | Admitting: Family

## 2012-05-07 ENCOUNTER — Encounter: Payer: Self-pay | Admitting: Family

## 2012-05-07 ENCOUNTER — Other Ambulatory Visit: Payer: Self-pay

## 2012-05-07 ENCOUNTER — Ambulatory Visit (INDEPENDENT_AMBULATORY_CARE_PROVIDER_SITE_OTHER)
Admission: RE | Admit: 2012-05-07 | Discharge: 2012-05-07 | Disposition: A | Discharge status: Home / Self Care | Payer: BC Managed Care – PPO | Source: Ambulatory Visit | Attending: Family | Admitting: Family

## 2012-05-07 VITALS — BP 120/70 | HR 93 | Wt 223.0 lb

## 2012-05-07 DIAGNOSIS — E7800 Pure hypercholesterolemia, unspecified: Secondary | ICD-10-CM

## 2012-05-07 DIAGNOSIS — M25539 Pain in unspecified wrist: Secondary | ICD-10-CM

## 2012-05-07 DIAGNOSIS — Z0000 Encounter for general adult medical examination without abnormal findings: Secondary | ICD-10-CM

## 2012-05-07 DIAGNOSIS — M19039 Primary osteoarthritis, unspecified wrist: Secondary | ICD-10-CM

## 2012-05-07 DIAGNOSIS — M25531 Pain in right wrist: Secondary | ICD-10-CM

## 2012-05-07 MED ORDER — MELOXICAM 15 MG PO TABS: 15.0000 mg | ORAL_TABLET | Freq: Every day | ORAL | Status: DC

## 2012-05-07 MED ORDER — ROSUVASTATIN CALCIUM 10 MG PO TABS: 10.0000 mg | ORAL_TABLET | Freq: Every day | ORAL | Status: DC

## 2012-05-07 NOTE — Progress Notes (Signed)
Subjective:    Patient ID: Heather Chen, female    DOB: 01-22-1960, 52 y.o.   MRN: [id number]  HPI  52 year old white female, nonsmoker is in for recheck of hypercholesterolemia. She's been taking Crestor 10 mg once a day and tolerating it well. Denies any concerns. She's not exercising.  Patient has concerns of right wrist pain that's been ongoing x4 weeks. She describes it as a twinge with certain movement. She's been applying ice and use of Advil that helped some but does not completely relieve the pain. She works at a [employer] daily. Since been diagnosed with carpal, syndrome in March, she's been using her left hand to do more activities. Has decreased cooking and housework. Pain is a 3-4/10 daily. She has a family history significant for osteoarthritis of the hands, her mother and sister.  Review of Systems  Constitutional: Negative.   HENT: Negative.   Respiratory: Negative.  Negative for cough and wheezing.   Cardiovascular: Negative.  Negative for chest pain, palpitations and leg swelling.  Gastrointestinal: Negative.   Endocrine: Negative.   Genitourinary: Negative.   Musculoskeletal: Negative for myalgias and joint swelling.       Right wrist pain  Skin: Negative.   Allergic/Immunologic: Negative.   Neurological: Negative.        Numbness in left and right arm from the elbow down  Hematological: Negative.   Psychiatric/Behavioral: Negative.  The patient is not hyperactive.    Past Medical History  Diagnosis Date  . H/O seasonal allergies   . Tennis elbow   . Hyperlipidemia     History   Social History  . Marital Status: Married    Spouse Name: N/A    Number of Children: N/A  . Years of Education: N/A   Occupational History  . Not on file.   Social History Main Topics  . Smoking status: Former Smoker -- 10 years    Types: Cigarettes    Quit date: 01/28/1995  . Smokeless tobacco: Not on file  . Alcohol Use: 3.0 oz/week    5 Cans of beer per week  . Drug  Use: No  . Sexually Active: Not on file   Other Topics Concern  . Not on file   Social History Narrative  . No narrative on file    Past Surgical History  Procedure Laterality Date  . Breast biopsy  1994    right; benign    Family History  Problem Relation Age of Onset  . Colon cancer Neg Hx     Allergies  Allergen Reactions  . Neomycin     rash    Current Outpatient Prescriptions on File Prior to Visit  Medication Sig Dispense Refill  . aspirin 81 MG tablet Take 81 mg by mouth daily.      . Cetirizine HCl (ZYRTEC PO) Take by mouth daily.      . Multiple Vitamin (MULTIVITAMIN) tablet Take 1 tablet by mouth daily.      . TRI-PREVIFEM 0.18/0.215/0.25 MG-35 MCG tablet        No current facility-administered medications on file prior to visit.    BP 120/70  Pulse 93  Wt 223 lb (101.152 kg)  BMI 32 kg/m2  SpO2 97%  LMP 04/16/2014chart    Objective:   Physical Exam  Constitutional: She is oriented to person, place, and time. She appears well-developed and well-nourished.  HENT:  Right Ear: External ear normal.  Left Ear: External ear normal.  Nose: Nose normal.  Mouth/Throat: Oropharynx  is clear and moist.  Neck: Normal range of motion. Neck supple.  Cardiovascular: Normal rate, regular rhythm and normal heart sounds.   Pulmonary/Chest: Effort normal and breath sounds normal.  Abdominal: Soft. Bowel sounds are normal.  Musculoskeletal: Normal range of motion.  Neurological: She is alert and oriented to person, place, and time.  Skin: Skin is warm and dry.  Psychiatric: She has a normal mood and affect.          Assessment & Plan:  Assessment:  1. Hypercholesterolemia 2. Right wrist pain 3. Osteoarthritis  Plan: X-ray of the right wrist. And and the patient and the results. Start Mobic 15 mg one tablet daily with food. Patient will return tomorrow for fasting cholesterol and LFTs. Patient call the office with any questions or concerns. Continue  Crestor 10 mg once daily. Recheck in 6 months and sooner as needed.

## 2012-05-07 NOTE — Progress Notes (Signed)
Error

## 2012-05-07 NOTE — Patient Instructions (Signed)
Osteoarthritis Osteoarthritis is the most common form of arthritis. It is redness, soreness, and swelling (inflammation) affecting the cartilage. Cartilage acts as a cushion, covering the ends of bones where they meet to form a joint. CAUSES  Over time, the cartilage begins to wear away. This causes bone to rub on bone. This produces pain and stiffness in the affected joints. Factors that contribute to this problem are:  Excessive body weight.  Age.  Overuse of joints. SYMPTOMS   People with osteoarthritis usually experience joint pain, swelling, or stiffness.  Over time, the joint may lose its normal shape.  Small deposits of bone (osteophytes) may grow on the edges of the joint.  Bits of bone or cartilage can break off and float inside the joint space. This may cause more pain and damage.  Osteoarthritis can lead to depression, anxiety, feelings of helplessness, and limitations on daily activities. The most commonly affected joints are in the:  Ends of the fingers.  Thumbs.  Neck.  Lower back.  Knees.  Hips. DIAGNOSIS  Diagnosis is mostly based on your symptoms and exam. Tests may be helpful, including:  X-rays of the affected joint.  A computerized magnetic scan (MRI).  Blood tests to rule out other types of arthritis.  Joint fluid tests. This involves using a needle to draw fluid from the joint and examining the fluid under a microscope. TREATMENT  Goals of treatment are to control pain, improve joint function, maintain a normal body weight, and maintain a healthy lifestyle. Treatment approaches may include:  A prescribed exercise program with rest and joint relief.  Weight control with nutritional education.  Pain relief techniques such as:  Properly applied heat and cold.  Electric pulses delivered to nerve endings under the skin (transcutaneous electrical nerve stimulation, TENS).  Massage.  Certain supplements. Ask your caregiver before using any  supplements, especially in combination with prescribed drugs.  Medicines to control pain, such as:  Acetaminophen.  Nonsteroidal anti-inflammatory drugs (NSAIDs), such as naproxen.  Narcotic or central-acting agents, such as tramadol. This drug carries a risk of addiction and is generally prescribed for short-term use.  Corticosteroids. These can be given orally or as injection. This is a short-term treatment, not recommended for routine use.  Surgery to reposition the bones and relieve pain (osteotomy) or to remove loose pieces of bone and cartilage. Joint replacement may be needed in advanced states of osteoarthritis. HOME CARE INSTRUCTIONS  Your caregiver can recommend specific types of exercise. These may include:  Strengthening exercises. These are done to strengthen the muscles that support joints affected by arthritis. They can be performed with weights or with exercise bands to add resistance.  Aerobic activities. These are exercises, such as brisk walking or low-impact aerobics, that get your heart pumping. They can help keep your lungs and circulatory system in shape.  Range-of-motion activities. These keep your joints limber.  Balance and agility exercises. These help you maintain daily living skills. Learning about your condition and being actively involved in your care will help improve the course of your osteoarthritis. SEEK MEDICAL CARE IF:   You feel hot or your skin turns red.  You develop a rash in addition to your joint pain.  You have an oral temperature above 102 F (38.9 C). FOR MORE INFORMATION  National Institute of Arthritis and Musculoskeletal and Skin Diseases: www.niams.nih.gov National Institute on Aging: www.nia.nih.gov American College of Rheumatology: www.rheumatology.org Document Released: 12/25/2004 Document Revised: 03/19/2011 Document Reviewed: 04/07/2009 ExitCare Patient Information 2013 [employer].  

## 2012-05-08 ENCOUNTER — Other Ambulatory Visit: Payer: BC Managed Care – PPO

## 2012-05-08 LAB — HEPATIC FUNCTION PANEL
ALT: 24 U/L (ref 0–35)
Albumin: 3.9 g/dL (ref 3.5–5.2)
Total Bilirubin: 0.4 mg/dL (ref 0.3–1.2)

## 2012-05-08 LAB — LIPID PANEL
HDL: 59.2 mg/dL (ref >39.00)
Triglycerides: 297 mg/dL — ABNORMAL HIGH (ref 0.0–149.0)

## 2012-05-08 LAB — LDL CHOLESTEROL, DIRECT: Direct LDL: 99.3 mg/dL

## 2012-05-08 NOTE — Addendum Note (Signed)
Addended by: Bonnye Fava on: 05/08/2012 10:01 AM   Modules accepted: Orders

## 2012-05-09 ENCOUNTER — Other Ambulatory Visit: Payer: Self-pay | Admitting: Family

## 2012-05-23 ENCOUNTER — Telehealth: Payer: Self-pay | Admitting: Internal Medicine

## 2012-05-23 MED ORDER — SIMVASTATIN 20 MG PO TABS: 20.0000 mg | ORAL_TABLET | Freq: Every day | ORAL | Status: DC

## 2012-05-23 NOTE — Telephone Encounter (Signed)
Try simvastatin 20mg  once day

## 2012-05-23 NOTE — Telephone Encounter (Signed)
Pt aware of change and verbalized understanding. Will pick Rx up today

## 2012-05-23 NOTE — Telephone Encounter (Signed)
Patient was started on CRESTOR 10mg  recently and given samples. Rx written by Padonda at last visit. Insurance will not cover CRESTOR until pt tries and fails at least one generic statin.  Please advise. Pt uses [facility] on [address]. Thank you.

## 2012-06-27 ENCOUNTER — Telehealth: Payer: Self-pay | Admitting: Internal Medicine

## 2012-06-27 NOTE — Telephone Encounter (Signed)
Patient Information:  Caller Name: Zafirah  Phone: [phone]  Patient: Heather Chen, Heather Chen  Gender: Female  DOB: May 06, 1960  Age: 52 Years  PCP: Darryll Capers (Adults only)  Pregnant: No  Office Follow Up:  Does the office need to follow up with this patient?: Yes  Instructions For The Office: OFFICE PLEASE REVIEW AND CALL PT REGARDING SIMVASTATIN INTOLERANCE   Symptoms  Reason For Call & Symptoms: Pt is taking Simvastatin 20mg  and she has taken 17 pills and she is feeling "funny;"  no muscle cramps or weakness; just feeling sluggish and fatigue in legs; not feeling as "sharp" as she normally does;  this is not interferring with her daily activities;  started Simvastatin around 06/08/12; sx started around 06/16/12;   pt works out with a Psychologist, educational and  her work outs are fine and using the same weights as before; feels hard to move at times; denies difficulty breathing; no chest pain; pt would like MD to review and see if she needs to start a different medicine if she can not tolerate the Simvastatin; pt did not take Simavastatin last night and sx are less noticeable today;  Reviewed Health History In EMR: Yes  Reviewed Medications In EMR: Yes  Reviewed Allergies In EMR: Yes  Reviewed Surgeries / Procedures: Yes  Date of Onset of Symptoms: 06/16/2012 OB / GYN:  LMP: 06/11/2012  Guideline(s) Used:  No Protocol Available - Information Only  Disposition Per Guideline:   Discuss with PCP and Callback by Nurse Today  Reason For Disposition Reached:   Nursing judgment  Advice Given:  Call Back If:  New symptoms develop  You become worse.  Patient Will Follow Care Advice:  YES

## 2012-06-30 NOTE — Telephone Encounter (Signed)
Per dr Lovell Sheehan take zocor qod and let us know if that help and sx continue

## 2012-06-30 NOTE — Telephone Encounter (Signed)
Heather Chen, pt calling to follow up on below call from Friday. It looks as though it was sent to our CAN pool but no one routed to you. Please advise & call pt. Thank you!

## 2012-08-18 ENCOUNTER — Ambulatory Visit (INDEPENDENT_AMBULATORY_CARE_PROVIDER_SITE_OTHER): Payer: BC Managed Care – PPO | Admitting: Family

## 2012-08-18 VITALS — BP 112/80 | HR 71 | Wt 219.0 lb

## 2012-08-18 DIAGNOSIS — F430 Acute stress reaction: Secondary | ICD-10-CM

## 2012-08-18 DIAGNOSIS — IMO0001 Reserved for inherently not codable concepts without codable children: Secondary | ICD-10-CM

## 2012-08-18 LAB — CBC WITH DIFFERENTIAL/PLATELET
Basophils Absolute: 0 10*3/uL (ref 0.0–0.1)
Eosinophils Relative: 1.9 % (ref 0.0–5.0)
HCT: 39.3 % (ref 36.0–46.0)
Lymphocytes Relative: 35.9 % (ref 12.0–46.0)
Monocytes Relative: 5.3 % (ref 3.0–12.0)
Neutrophils Relative %: 56.3 % (ref 43.0–77.0)
Platelets: 256 10*3/uL (ref 150.0–400.0)
WBC: 8.4 10*3/uL (ref 4.5–10.5)

## 2012-08-18 LAB — BASIC METABOLIC PANEL
CO2: 26 mEq/L (ref 19–32)
Calcium: 9 mg/dL (ref 8.4–10.5)
GFR: 70.13 mL/min (ref >60.00)
Sodium: 139 mEq/L (ref 135–145)

## 2012-08-18 LAB — TSH: TSH: 1 u[IU]/mL (ref 0.35–5.50)

## 2012-08-18 NOTE — Progress Notes (Signed)
Subjective:    Patient ID: Heather Chen, female    DOB: Nov 30, 1960, 52 y.o.   MRN: [id number]  HPI 52 year old WF, nonsmoker, patient of Dr. Lovell Sheehan is in today with c/o bilateral knee pain, pain to the back of her thighs, and left hip that is worse when going up steps. She reports that she has been exercising more, running/walking over the last several days. She takes Mobic sporadically that helps when she takes it. She reports being more emotional, irritable, and stress recently dealing with a demnted mother and a depressed husband. Her husband is here today and reports that she has these symptoms about once every 3 months related to her menstrual cycle. Reports during these times she c/o of being achy, tired, and cries more. He reports he has tracked these symptoms over the last year but his wife does not believe it is related to her menstrual cycle.   Patient also reports similar symptoms about 2 months ago and Dr. Lovell Sheehan advised her to take Crestor every other day. She reports she got better so she resumed taking it daily and has been x 3 weeks.    Review of Systems  Constitutional: Negative.   HENT: Negative.   Respiratory: Negative.   Cardiovascular: Negative.   Gastrointestinal: Negative.   Genitourinary: Negative.   Musculoskeletal: Positive for arthralgias. Negative for myalgias, back pain and joint swelling.       Left leg pain  Skin: Negative.   Neurological: Negative.   Psychiatric/Behavioral: Negative.    Past Medical History  Diagnosis Date  . H/O seasonal allergies   . Tennis elbow   . Hyperlipidemia     History   Social History  . Marital Status: Married    Spouse Name: N/A    Number of Children: N/A  . Years of Education: N/A   Occupational History  . Not on file.   Social History Main Topics  . Smoking status: Former Smoker -- 10 years    Types: Cigarettes    Quit date: 01/28/1995  . Smokeless tobacco: Not on file  . Alcohol Use: 3.0 oz/week    5  Cans of beer per week  . Drug Use: No  . Sexually Active: Not on file   Other Topics Concern  . Not on file   Social History Narrative  . No narrative on file    Past Surgical History  Procedure Laterality Date  . Breast biopsy  1994    right; benign    Family History  Problem Relation Age of Onset  . Colon cancer Neg Hx     Allergies  Allergen Reactions  . Neomycin     rash    Current Outpatient Prescriptions on File Prior to Visit  Medication Sig Dispense Refill  . meloxicam (MOBIC) 15 MG tablet Take 1 tablet (15 mg total) by mouth daily.  30 tablet  3  . aspirin 81 MG tablet Take 81 mg by mouth daily.      . Cetirizine HCl (ZYRTEC PO) Take by mouth daily.      . Multiple Vitamin (MULTIVITAMIN) tablet Take 1 tablet by mouth daily.      . rosuvastatin (CRESTOR) 10 MG tablet Take 1 tablet (10 mg total) by mouth daily.  30 tablet  3  . simvastatin (ZOCOR) 20 MG tablet Take 1 tablet (20 mg total) by mouth at bedtime.  30 tablet  3  . TRI-PREVIFEM 0.18/0.215/0.25 MG-35 MCG tablet  No current facility-administered medications on file prior to visit.    BP 112/80  Pulse 71  Wt 219 lb (99.338 kg)  BMI 31.42 kg/m2chart    Objective:   Physical Exam  Constitutional: She is oriented to person, place, and time. She appears well-developed and well-nourished.  Neck: Normal range of motion. Neck supple.  Cardiovascular: Normal rate, regular rhythm and normal heart sounds.   Pulmonary/Chest: Effort normal and breath sounds normal.  Musculoskeletal: Normal range of motion. She exhibits no edema and no tenderness.  Neurological: She is alert and oriented to person, place, and time. She has normal reflexes. No cranial nerve deficit. Coordination normal.  Skin: Skin is warm and dry.  Psychiatric: She has a normal mood and affect.          Assessment & Plan:  Assessment: 1. Myalgias 2. Back Pain 3. Stress Reaction  Plan: I strongly suspect the underlying cause  of her symptoms is emotional due to stress. Her menstrual cycles may be worsening an already stressful environment. Consider SSRI. Labs sent to be sure their is no underlying cause. Call the office with any questions or concerns. Recheck as scheduled and as needed. Hold Crestor x 1 week.

## 2012-08-18 NOTE — Patient Instructions (Addendum)
Stress Stress-related medical problems are becoming increasingly common. The body has a built-in physical response to stressful situations. Faced with pressure, challenge or danger, we need to react quickly. Our bodies release hormones such as cortisol and adrenaline to help do this. These hormones are part of the "fight or flight" response and affect the metabolic rate, heart rate and blood pressure, resulting in a heightened, stressed state that prepares the body for optimum performance in dealing with a stressful situation. It is likely that early man required these mechanisms to stay alive, but usually modern stresses do not call for this, and the same hormones released in today's world can damage health and reduce coping ability. CAUSES  Pressure to perform at work, at school or in sports.  Threats of physical violence.  Money worries.  Arguments.  Family conflicts.  Divorce or separation from significant other.  Bereavement.  New job or unemployment.  Changes in location.  Alcohol or drug abuse. SOMETIMES, THERE IS NO PARTICULAR REASON FOR DEVELOPING STRESS. Almost all people are at risk of being stressed at some time in their lives. It is important to know that some stress is temporary and some is long term.  Temporary stress will go away when a situation is resolved. Most people can cope with short periods of stress, and it can often be relieved by relaxing, taking a walk, chatting through issues with friends, or having a good night's sleep.  Chronic (long-term, continuous) stress is much harder to deal with. It can be psychologically and emotionally damaging. It can be harmful both for an individual and for friends and family. SYMPTOMS Everyone reacts to stress differently. There are some common effects that help us recognize it. In times of extreme stress, people may:  Shake uncontrollably.  Breathe faster and deeper than normal (hyperventilate).  Vomit.  For people  with asthma, stress can trigger an attack.  For some people, stress may trigger migraine headaches, ulcers, and body pain. PHYSICAL EFFECTS OF STRESS MAY INCLUDE:  Loss of energy.  Skin problems.  Aches and pains resulting from tense muscles, including neck ache, backache and tension headaches.  Increased pain from arthritis and other conditions.  Irregular heart beat (palpitations).  Periods of irritability or anger.  Apathy or depression.  Anxiety (feeling uptight or worrying).  Unusual behavior.  Loss of appetite.  Comfort eating.  Lack of concentration.  Loss of, or decreased, sex-drive.  Increased smoking, drinking, or recreational drug use.  For women, missed periods.  Ulcers, joint pain, and muscle pain. Post-traumatic stress is the stress caused by any serious accident, strong emotional damage, or extremely difficult or violent experience such as rape or war. Post-traumatic stress victims can experience mixtures of emotions such as fear, shame, depression, guilt or anger. It may include recurrent memories or images that may be haunting. These feelings can last for weeks, months or even years after the traumatic event that triggered them. Specialized treatment, possibly with medicines and psychological therapies, is available. If stress is causing physical symptoms, severe distress or making it difficult for you to function as normal, it is worth seeing your caregiver. It is important to remember that although stress is a usual part of life, extreme or prolonged stress can lead to other illnesses that will need treatment. It is better to visit a doctor sooner rather than later. Stress has been linked to the development of high blood pressure and heart disease, as well as insomnia and depression. There is no diagnostic test for   stress since everyone reacts to it differently. But a caregiver will be able to spot the physical symptoms, such  as:  Headaches.  Shingles.  Ulcers. Emotional distress such as intense worry, low mood or irritability should be detected when the doctor asks pertinent questions to identify any underlying problems that might be the cause. In case there are physical reasons for the symptoms, the doctor may also want to do some tests to exclude certain conditions. If you feel that you are suffering from stress, try to identify the aspects of your life that are causing it. Sometimes you may not be able to change or avoid them, but even a small change can have a positive ripple effect. A simple lifestyle change can make all the difference. STRATEGIES THAT CAN HELP DEAL WITH STRESS:  Delegating or sharing responsibilities.  Avoiding confrontations.  Learning to be more assertive.  Regular exercise.  Avoid using alcohol or street drugs to cope.  Eating a healthy, balanced diet, rich in fruit and vegetables and proteins.  Finding humor or absurdity in stressful situations.  Never taking on more than you know you can handle comfortably.  Organizing your time better to get as much done as possible.  Talking to friends or family and sharing your thoughts and fears.  Listening to music or relaxation tapes.  Tensing and then relaxing your muscles, starting at the toes and working up to the head and neck. If you think that you would benefit from help, either in identifying the things that are causing your stress or in learning techniques to help you relax, see a caregiver who is capable of helping you with this. Rather than relying on medications, it is usually better to try and identify the things in your life that are causing stress and try to deal with them. There are many techniques of managing stress including counseling, psychotherapy, aromatherapy, yoga, and exercise. Your caregiver can help you determine what is best for you. Document Released: 03/17/2002 Document Revised: 03/19/2011 Document  Reviewed: 02/11/2007 [employer] Patient Information 2014 [employer].  

## 2012-08-19 ENCOUNTER — Telehealth: Payer: Self-pay

## 2012-08-19 LAB — LACTIC ACID, PLASMA: LACTIC ACID: 0.9 mmol/L (ref 0.5–2.2)

## 2012-08-19 NOTE — Telephone Encounter (Signed)
Pt declined SSRI recommended by [facility], stating that she thinks she knows the source of her stress and she will try to deal with it on her own.  Oran Rein is aware

## 2012-08-21 ENCOUNTER — Encounter: Payer: Self-pay | Admitting: *Deleted

## 2012-10-12 ENCOUNTER — Other Ambulatory Visit: Payer: Self-pay | Admitting: Family

## 2012-11-10 ENCOUNTER — Ambulatory Visit: Payer: BC Managed Care – PPO | Admitting: Internal Medicine

## 2012-11-19 ENCOUNTER — Ambulatory Visit: Payer: BC Managed Care – PPO | Admitting: Internal Medicine

## 2012-12-19 ENCOUNTER — Other Ambulatory Visit: Payer: Self-pay

## 2012-12-19 DIAGNOSIS — Z1231 Encounter for screening mammogram for malignant neoplasm of breast: Secondary | ICD-10-CM

## 2012-12-19 DIAGNOSIS — Z803 Family history of malignant neoplasm of breast: Secondary | ICD-10-CM

## 2013-01-03 ENCOUNTER — Encounter (HOSPITAL_COMMUNITY): Payer: Self-pay | Admitting: Emergency Medicine

## 2013-01-03 ENCOUNTER — Emergency Department (INDEPENDENT_AMBULATORY_CARE_PROVIDER_SITE_OTHER)
Admission: EM | Admit: 2013-01-03 | Discharge: 2013-01-03 | Disposition: A | Discharge status: Home / Self Care | Payer: BC Managed Care – PPO | Source: Home / Self Care | Attending: Emergency Medicine | Admitting: Emergency Medicine

## 2013-01-03 DIAGNOSIS — R109 Unspecified abdominal pain: Secondary | ICD-10-CM

## 2013-01-03 LAB — POCT PREGNANCY, URINE: Preg Test, Ur: NEGATIVE

## 2013-01-03 LAB — POCT URINALYSIS DIP (DEVICE)
Bilirubin Urine: NEGATIVE
Glucose, UA: NEGATIVE mg/dL
Ketones, ur: NEGATIVE mg/dL
Specific Gravity, Urine: 1.015 (ref 1.005–1.030)

## 2013-01-03 NOTE — ED Provider Notes (Signed)
CSN: [id number]     Arrival date & time 01/03/13  1642 History   First MD Initiated Contact with Patient 01/03/13 1808     Chief Complaint  Patient presents with  . Flank Pain   (Consider location/radiation/quality/duration/timing/severity/associated sxs/prior Treatment) HPI Comments: Currently on last day of her menses. Normal BM this morning. No previous similar events.   Patient is a 52 y.o. female presenting with abdominal pain.  Abdominal Pain Pain location:  RLQ Pain quality: aching and dull   Pain radiates to:  R flank Pain severity:  Mild Onset quality:  Gradual Duration: began 3-4 hour PTA. Timing:  Constant Progression:  Unchanged Chronicity:  New Context: not previous surgeries, not recent illness and not trauma   Worsened by:  Palpation Ineffective treatments:  None tried Associated symptoms: no anorexia, no chest pain, no chills, no constipation, no cough, no diarrhea, no dysuria, no fatigue, no fever, no hematemesis, no hematochezia, no hematuria, no melena, no nausea, no shortness of breath, no vaginal bleeding, no vaginal discharge and no vomiting     Past Medical History  Diagnosis Date  . H/O seasonal allergies   . Tennis elbow   . Hyperlipidemia    Past Surgical History  Procedure Laterality Date  . Breast biopsy  1994    right; benign   Family History  Problem Relation Age of Onset  . Breast cancer Mother   . Leukemia Father 71  . Osteoarthritis Mother 3  . Rheum arthritis Mother 2    psoriatic arthritis   History  Substance Use Topics  . Smoking status: Former Smoker -- 10 years    Types: Cigarettes    Quit date: 01/09/1995  . Smokeless tobacco: Not on file  . Alcohol Use: 3.0 oz/week    5 Cans of beer per week   OB History   Grav Para Term Preterm Abortions TAB SAB Ect Mult Living                 Review of Systems  Constitutional: Negative for fever, chills, appetite change and fatigue.  HENT: Negative.   Eyes: Negative.    Respiratory: Negative for cough, chest tightness and shortness of breath.   Cardiovascular: Negative for chest pain, palpitations and leg swelling.  Gastrointestinal: Positive for abdominal pain. Negative for nausea, vomiting, diarrhea, constipation, blood in stool, melena, hematochezia, anorexia and hematemesis.  Endocrine: Negative for polydipsia, polyphagia and polyuria.  Genitourinary: Negative for dysuria, hematuria, vaginal bleeding and vaginal discharge.  Musculoskeletal: Negative.   Skin: Negative.   Allergic/Immunologic: Negative for immunocompromised state.  Neurological: Negative.   Hematological: Negative for adenopathy.  Psychiatric/Behavioral: Negative.     Allergies  Neomycin  Home Medications   Current Outpatient Rx  Name  Route  Sig  Dispense  Refill  . rosuvastatin (CRESTOR) 10 MG tablet   Oral   Take 1 tablet (10 mg total) by mouth daily.   30 tablet   3   . simvastatin (ZOCOR) 20 MG tablet      TAKE 1 TABLET (20 MG TOTAL) BY MOUTH AT BEDTIME.   90 tablet   3   . TRI-PREVIFEM 0.18/0.215/0.25 MG-35 MCG tablet               . aspirin 81 MG tablet   Oral   Take 81 mg by mouth daily.         . Cetirizine HCl (ZYRTEC PO)   Oral   Take by mouth daily.         Marland Kitchen  meloxicam (MOBIC) 15 MG tablet   Oral   Take 1 tablet (15 mg total) by mouth daily.   30 tablet   3   . Multiple Vitamin (MULTIVITAMIN) tablet   Oral   Take 1 tablet by mouth daily.          BP 158/76  Pulse 84  Temp(Src) 98.8 F (37.1 C) (Oral)  Resp 16  SpO2 99%  LMP 01/03/2013 Physical Exam  Nursing note and vitals reviewed. Constitutional: She is oriented to person, place, and time. She appears well-developed and well-nourished. No distress.  HENT:  Head: Normocephalic and atraumatic.  Eyes: Conjunctivae are normal. No scleral icterus.  Neck: Normal range of motion. Neck supple.  Cardiovascular: Normal rate, regular rhythm and normal heart sounds.    Pulmonary/Chest: Effort normal and breath sounds normal. No respiratory distress.  Abdominal: Soft. She exhibits no mass. Bowel sounds are decreased. There is no hepatosplenomegaly. There is tenderness in the right lower quadrant. There is no rebound, no guarding and no CVA tenderness. No hernia.  Musculoskeletal: Normal range of motion.  Lymphadenopathy:    She has no cervical adenopathy.  Neurological: She is alert and oriented to person, place, and time.  Skin: Skin is warm and dry. No rash noted.  Psychiatric: She has a normal mood and affect. Her behavior is normal.    ED Course  Procedures (including critical care time) Labs Review Labs Reviewed  POCT URINALYSIS DIP (DEVICE) - Abnormal; Notable for the following:    Hgb urine dipstick MODERATE (*)    All other components within normal limits  POCT PREGNANCY, URINE   Imaging Review No results found.  EKG Interpretation    Date/Time:    Ventricular Rate:    PR Interval:    QRS Duration:   QT Interval:    QTC Calculation:   R Axis:     Text Interpretation:              MDM  Had lengthy discussion with patient about possibility of early appendicitis vs. Leaking or ruptured ovarian cyst or ovarian torsion. Suggested that patient consider being seen at ER for imaging (pelvic U/S or CT A/P) for diagnostic clarification. She decided that she would rather "wait and see how things go at home." She does state that her husband and neighbor are at home to help her should she need it and that she has reliable transportation should she need to return. She understands that if over the next 6-8 hours her pain intensifies, she develops fever, N/V, or other additional symptoms she is to report to her nearest Emergency Room for re-evaluation. UA negative. UPT negative.     Jess Barters [place], [place] 01/03/13 2009

## 2013-01-03 NOTE — ED Provider Notes (Signed)
Medical screening examination/treatment/procedure(s) were performed by non-physician practitioner and as supervising physician I was immediately available for consultation/collaboration.  Bernell Sigal, M.D.  Jhordan Kinter C Kayle Passarelli, MD 01/03/13 2313 

## 2013-01-03 NOTE — ED Notes (Signed)
Pt c/o right flank pain onset 3-4 hours.... Reports she walked 3 miles this am... Having normal BM  Denies: f/v/n/d, dysuria but is reporting frequent urination Alert w/no signs of acute distress.

## 2013-01-21 ENCOUNTER — Ambulatory Visit
Admission: RE | Admit: 2013-01-21 | Discharge: 2013-01-21 | Disposition: A | Discharge status: Home / Self Care | Payer: BC Managed Care – PPO | Source: Ambulatory Visit

## 2013-01-21 DIAGNOSIS — Z1231 Encounter for screening mammogram for malignant neoplasm of breast: Secondary | ICD-10-CM

## 2013-01-21 DIAGNOSIS — Z803 Family history of malignant neoplasm of breast: Secondary | ICD-10-CM

## 2013-03-23 ENCOUNTER — Other Ambulatory Visit: Payer: BC Managed Care – PPO

## 2013-03-30 ENCOUNTER — Encounter: Payer: BC Managed Care – PPO | Admitting: Internal Medicine

## 2013-04-01 ENCOUNTER — Other Ambulatory Visit: Payer: Self-pay | Admitting: Family

## 2013-06-22 ENCOUNTER — Other Ambulatory Visit (INDEPENDENT_AMBULATORY_CARE_PROVIDER_SITE_OTHER): Payer: BC Managed Care – PPO

## 2013-06-22 DIAGNOSIS — Z0000 Encounter for general adult medical examination without abnormal findings: Secondary | ICD-10-CM

## 2013-06-22 LAB — BASIC METABOLIC PANEL
BUN: 14 mg/dL (ref 6–23)
CALCIUM: 9.3 mg/dL (ref 8.4–10.5)
CHLORIDE: 105 meq/L (ref 96–112)
CO2: 27 mEq/L (ref 19–32)
CREATININE: 1 mg/dL (ref 0.4–1.2)
GFR: 60.49 mL/min (ref >60.00)
Glucose, Bld: 98 mg/dL (ref 70–99)
Potassium: 4.5 mEq/L (ref 3.5–5.1)
Sodium: 139 mEq/L (ref 135–145)

## 2013-06-22 LAB — CBC WITH DIFFERENTIAL/PLATELET
Basophils Absolute: 0.1 10*3/uL (ref 0.0–0.1)
Basophils Relative: 0.7 % (ref 0.0–3.0)
EOS ABS: 0.4 10*3/uL (ref 0.0–0.7)
EOS PCT: 5.8 % — AB (ref 0.0–5.0)
HCT: 40.8 % (ref 36.0–46.0)
Hemoglobin: 13.7 g/dL (ref 12.0–15.0)
LYMPHS PCT: 31.4 % (ref 12.0–46.0)
Lymphs Abs: 2.4 10*3/uL (ref 0.7–4.0)
MCHC: 33.6 g/dL (ref 30.0–36.0)
MCV: 91.2 fl (ref 78.0–100.0)
MONO ABS: 0.4 10*3/uL (ref 0.1–1.0)
Monocytes Relative: 5.8 % (ref 3.0–12.0)
NEUTROS PCT: 56.3 % (ref 43.0–77.0)
Neutro Abs: 4.3 10*3/uL (ref 1.4–7.7)
PLATELETS: 269 10*3/uL (ref 150.0–400.0)
RBC: 4.47 Mil/uL (ref 3.87–5.11)
RDW: 14.1 % (ref 11.5–15.5)
WBC: 7.6 10*3/uL (ref 4.0–10.5)

## 2013-06-22 LAB — LIPID PANEL
CHOLESTEROL: 201 mg/dL — AB (ref 0–200)
HDL: 58.9 mg/dL (ref >39.00)
LDL Cholesterol: 108 mg/dL — ABNORMAL HIGH (ref 0–99)
NonHDL: 142.1
Total CHOL/HDL Ratio: 3
Triglycerides: 172 mg/dL — ABNORMAL HIGH (ref 0.0–149.0)
VLDL: 34.4 mg/dL (ref 0.0–40.0)

## 2013-06-22 LAB — HEPATIC FUNCTION PANEL
ALT: 28 U/L (ref 0–35)
AST: 29 U/L (ref 0–37)
Albumin: 4.3 g/dL (ref 3.5–5.2)
Alkaline Phosphatase: 52 U/L (ref 39–117)
BILIRUBIN DIRECT: 0 mg/dL (ref 0.0–0.3)
BILIRUBIN TOTAL: 0.4 mg/dL (ref 0.2–1.2)
Total Protein: 6.9 g/dL (ref 6.0–8.3)

## 2013-06-22 LAB — POCT URINALYSIS DIPSTICK
BILIRUBIN UA: NEGATIVE
Glucose, UA: NEGATIVE
KETONES UA: NEGATIVE
LEUKOCYTES UA: NEGATIVE
Nitrite, UA: NEGATIVE
Spec Grav, UA: 1.025
Urobilinogen, UA: 0.2
pH, UA: 6

## 2013-06-22 LAB — TSH: TSH: 0.63 u[IU]/mL (ref 0.35–4.50)

## 2013-06-29 ENCOUNTER — Encounter: Payer: BC Managed Care – PPO | Admitting: Internal Medicine

## 2013-07-07 ENCOUNTER — Ambulatory Visit (INDEPENDENT_AMBULATORY_CARE_PROVIDER_SITE_OTHER): Payer: BC Managed Care – PPO | Admitting: Family

## 2013-07-07 ENCOUNTER — Encounter: Payer: Self-pay | Admitting: Family

## 2013-07-07 VITALS — BP 120/78 | HR 92 | Ht 68.75 in | Wt 212.0 lb

## 2013-07-07 DIAGNOSIS — E7800 Pure hypercholesterolemia, unspecified: Secondary | ICD-10-CM

## 2013-07-07 DIAGNOSIS — Z0000 Encounter for general adult medical examination without abnormal findings: Secondary | ICD-10-CM

## 2013-07-07 NOTE — Progress Notes (Signed)
Pre visit review using our clinic review tool, if applicable. No additional management support is needed unless otherwise documented below in the visit note. 

## 2013-07-08 ENCOUNTER — Encounter: Payer: Self-pay | Admitting: Family

## 2013-07-08 NOTE — Patient Instructions (Signed)

## 2013-07-08 NOTE — Progress Notes (Signed)
Subjective:    Patient ID: Heather Chen, Heather Plattfemale    DOB: 07/27/1960, 53 y.o.   MRN: [id number]  HPI 53 year old white female, nonsmoker, is in for a routine wellness  examination for this patient . I reviewed all health maintenance protocols including mammography, colonoscopy, bone density Needed referrals were placed. Age and diagnosis  appropriate screening labs were ordered. Her immunization history was reviewed and appropriate vaccinations were ordered. Her current medications and allergies were reviewed and needed refills of her chronic medications were ordered. The plan for yearly health maintenance was discussed all orders and referrals were made as appropriate.   Review of Systems  Constitutional: Negative.   HENT: Negative.   Eyes: Negative.   Respiratory: Negative.   Cardiovascular: Negative.   Gastrointestinal: Negative.   Endocrine: Negative.   Genitourinary: Negative.   Musculoskeletal: Negative.   Skin: Negative.   Allergic/Immunologic: Negative.   Neurological: Negative.   Hematological: Negative.   Psychiatric/Behavioral: Negative.    Past Medical History  Diagnosis Date  . H/O seasonal allergies   . Tennis elbow   . Hyperlipidemia     History   Social History  . Marital Status: Married    Spouse Name: Heather Chen    Number of Children: 0  . Years of Education: grad schoo   Occupational History  . Not on file.   Social History Main Topics  . Smoking status: Former Smoker -- 10 years    Types: Cigarettes    Quit date: 01/09/1995  . Smokeless tobacco: Not on file  . Alcohol Use: 3.0 oz/week    5 Cans of beer per week  . Drug Use: No  . Sexual Activity: Not on file   Other Topics Concern  . Not on file   Social History Narrative  . No narrative on file    Past Surgical History  Procedure Laterality Date  . Breast biopsy  1994    right; benign    Family History  Problem Relation Age of Onset  . Breast cancer Mother   . Leukemia Father 6756   . Osteoarthritis Mother 1450  . Rheum arthritis Mother 4950    psoriatic arthritis    Allergies  Allergen Reactions  . Neomycin     rash    Current Outpatient Prescriptions on File Prior to Visit  Medication Sig Dispense Refill  . aspirin 81 MG tablet Take 81 mg by mouth daily.      . Cetirizine HCl (ZYRTEC PO) Take by mouth daily.      . meloxicam (MOBIC) 15 MG tablet TAKE 1 TABLET (15 MG TOTAL) BY MOUTH DAILY.  30 tablet  3  . Multiple Vitamin (MULTIVITAMIN) tablet Take 1 tablet by mouth daily.      . rosuvastatin (CRESTOR) 10 MG tablet Take 1 tablet (10 mg total) by mouth daily.  30 tablet  3  . simvastatin (ZOCOR) 20 MG tablet TAKE 1 TABLET (20 MG TOTAL) BY MOUTH AT BEDTIME.  90 tablet  3  . TRI-PREVIFEM 0.18/0.215/0.25 MG-35 MCG tablet        No current facility-administered medications on file prior to visit.    BP 120/78  Pulse 92  Ht 5' 8.75" (1.746 m)  Wt 212 lb (96.163 kg)  BMI 31.54 kg/m2chart    Objective:   Physical Exam  Constitutional: She is oriented to person, place, and time. She appears well-developed and well-nourished.  HENT:  Head: Normocephalic and atraumatic.  Right Ear: External ear normal.  Left Ear: External ear normal.  Nose: Nose normal.  Mouth/Throat: Oropharynx is clear and moist.  Eyes: EOM are normal. Pupils are equal, round, and reactive to light.  Neck: Normal range of motion. Neck supple. No thyromegaly present.  Cardiovascular: Normal rate, regular rhythm and normal heart sounds.   Pulmonary/Chest: Effort normal and breath sounds normal.  Abdominal: Soft. Bowel sounds are normal.  Musculoskeletal: Normal range of motion. She exhibits no edema and no tenderness.  Neurological: She is alert and oriented to person, place, and time. She has normal reflexes. She displays normal reflexes. No cranial nerve deficit. Coordination normal.  Skin: Skin is warm and dry.  Psychiatric: She has a normal mood and affect.          Assessment &  Plan:  Heather Chen was seen today for annual exam.  Diagnoses and associated orders for this visit:  Preventative health care  Pure hypercholesterolemia   Call the office with any questions or concerns. Recheck in 6 months and sooner as needed.

## 2013-08-14 ENCOUNTER — Other Ambulatory Visit: Payer: Self-pay | Admitting: Internal Medicine

## 2013-11-09 ENCOUNTER — Other Ambulatory Visit: Payer: Self-pay

## 2013-11-09 DIAGNOSIS — Z1231 Encounter for screening mammogram for malignant neoplasm of breast: Secondary | ICD-10-CM

## 2013-11-24 ENCOUNTER — Telehealth: Payer: Self-pay | Admitting: Family

## 2013-11-24 MED ORDER — SIMVASTATIN 20 MG PO TABS: ORAL_TABLET | ORAL | Status: DC

## 2013-11-24 NOTE — Telephone Encounter (Signed)
[id number] - [place],  - [address] is requesting 90 day re-fill on simvastatin (ZOCOR) 20 MG tablet

## 2013-11-24 NOTE — Telephone Encounter (Signed)
Done

## 2014-01-04 ENCOUNTER — Telehealth: Payer: Self-pay | Admitting: Family

## 2014-01-04 NOTE — Telephone Encounter (Signed)
Error/njr °

## 2014-01-22 ENCOUNTER — Encounter (INDEPENDENT_AMBULATORY_CARE_PROVIDER_SITE_OTHER): Payer: Self-pay

## 2014-01-22 ENCOUNTER — Ambulatory Visit
Admission: RE | Admit: 2014-01-22 | Discharge: 2014-01-22 | Disposition: A | Discharge status: Home / Self Care | Payer: BLUE CROSS/BLUE SHIELD | Source: Ambulatory Visit

## 2014-01-22 DIAGNOSIS — Z1231 Encounter for screening mammogram for malignant neoplasm of breast: Secondary | ICD-10-CM

## 2014-01-27 ENCOUNTER — Other Ambulatory Visit: Payer: Self-pay | Admitting: Obstetrics & Gynecology

## 2014-01-27 ENCOUNTER — Ambulatory Visit: Payer: BC Managed Care – PPO

## 2014-01-27 DIAGNOSIS — R928 Other abnormal and inconclusive findings on diagnostic imaging of breast: Secondary | ICD-10-CM

## 2014-01-28 ENCOUNTER — Encounter: Payer: Self-pay | Admitting: Family

## 2014-02-17 ENCOUNTER — Telehealth: Payer: Self-pay

## 2014-02-17 MED ORDER — SIMVASTATIN 20 MG PO TABS: ORAL_TABLET | ORAL | Status: DC

## 2014-02-17 NOTE — Telephone Encounter (Signed)
REFILL REQUEST FOR SIMVASTATIN

## 2014-02-24 ENCOUNTER — Ambulatory Visit
Admission: RE | Admit: 2014-02-24 | Discharge: 2014-02-24 | Disposition: A | Discharge status: Home / Self Care | Payer: BLUE CROSS/BLUE SHIELD | Source: Ambulatory Visit | Attending: Obstetrics & Gynecology | Admitting: Obstetrics & Gynecology

## 2014-02-24 DIAGNOSIS — R928 Other abnormal and inconclusive findings on diagnostic imaging of breast: Secondary | ICD-10-CM

## 2014-03-24 ENCOUNTER — Telehealth: Payer: Self-pay | Admitting: Family

## 2014-03-24 NOTE — Telephone Encounter (Signed)
Pt called and she wanted to make sure that when she see Dr Selena BattenKim in July that this will be for a physical. She said if not she will consider leaving the practice. I did let her know that I check and give her a call back.

## 2014-03-25 NOTE — Telephone Encounter (Signed)
We do not do physicals at new patient visits as there usually just is not time to do both well. The new pt visit is to go over medical history and address any ongoing or current concerns. If she does not have any new concerns or concerns regarding her chronic conditions we may be able to make an exception and do her physical that day. Thank you.

## 2014-03-25 NOTE — Telephone Encounter (Signed)
Patient informed. 

## 2014-05-15 ENCOUNTER — Other Ambulatory Visit: Payer: Self-pay | Admitting: Family

## 2014-07-20 ENCOUNTER — Ambulatory Visit: Payer: BLUE CROSS/BLUE SHIELD | Admitting: Family Medicine

## 2014-07-22 ENCOUNTER — Telehealth: Payer: Self-pay | Admitting: Family

## 2014-07-22 NOTE — Telephone Encounter (Signed)
Pt is going out of town and would like to resch her 07-27-14. Can I resch ? Pt said she will come back from out of town to see md if need be.

## 2014-07-23 NOTE — Telephone Encounter (Signed)
Ok to schedule, but in available 30 minutes slot. Thanks.

## 2014-07-26 NOTE — Telephone Encounter (Signed)
lmom for pt to call back

## 2014-07-26 NOTE — Telephone Encounter (Signed)
Pt has been re-sch

## 2014-07-27 ENCOUNTER — Ambulatory Visit: Payer: BLUE CROSS/BLUE SHIELD | Admitting: Family Medicine

## 2014-08-01 ENCOUNTER — Other Ambulatory Visit: Payer: Self-pay | Admitting: Family

## 2014-10-19 ENCOUNTER — Other Ambulatory Visit: Payer: Self-pay

## 2014-10-19 DIAGNOSIS — Z1231 Encounter for screening mammogram for malignant neoplasm of breast: Secondary | ICD-10-CM

## 2014-11-03 ENCOUNTER — Ambulatory Visit (INDEPENDENT_AMBULATORY_CARE_PROVIDER_SITE_OTHER): Payer: BLUE CROSS/BLUE SHIELD | Admitting: Family Medicine

## 2014-11-03 ENCOUNTER — Encounter: Payer: Self-pay | Admitting: Family Medicine

## 2014-11-03 VITALS — BP 120/84 | HR 74 | Temp 98.4°F | Ht 68.75 in | Wt 226.5 lb

## 2014-11-03 DIAGNOSIS — E785 Hyperlipidemia, unspecified: Secondary | ICD-10-CM

## 2014-11-03 DIAGNOSIS — Z7689 Persons encountering health services in other specified circumstances: Secondary | ICD-10-CM

## 2014-11-03 DIAGNOSIS — Z0000 Encounter for general adult medical examination without abnormal findings: Secondary | ICD-10-CM

## 2014-11-03 DIAGNOSIS — R1310 Dysphagia, unspecified: Secondary | ICD-10-CM

## 2014-11-03 DIAGNOSIS — R112 Nausea with vomiting, unspecified: Secondary | ICD-10-CM

## 2014-11-03 DIAGNOSIS — J309 Allergic rhinitis, unspecified: Secondary | ICD-10-CM

## 2014-11-03 LAB — LIPID PANEL
Cholesterol: 263 mg/dL — ABNORMAL HIGH (ref 0–200)
HDL: 47.3 mg/dL (ref >39.00)
NONHDL: 215.35
TRIGLYCERIDES: 258 mg/dL — AB (ref 0.0–149.0)
Total CHOL/HDL Ratio: 6
VLDL: 51.6 mg/dL — AB (ref 0.0–40.0)

## 2014-11-03 LAB — LDL CHOLESTEROL, DIRECT: Direct LDL: 173 mg/dL

## 2014-11-03 LAB — HEMOGLOBIN A1C: HEMOGLOBIN A1C: 5.6 % (ref 4.6–6.5)

## 2014-11-03 NOTE — Progress Notes (Signed)
Pre visit review using our clinic review tool, if applicable. No additional management support is needed unless otherwise documented below in the visit note. 

## 2014-11-03 NOTE — Patient Instructions (Signed)
BEFORE YOU LEAVE: -labs -follow up appointment in 4-6 months  -We placed a referral for you as discussed to the Gastroenterologist regarding the nausea, vomiting and issues with swallowing. It usually takes about 1-2 weeks to process and schedule this referral. If you have not heard from us regarding this appointment in 2 weeks please contact our office.  -We have ordered labs or studies at this visit. It can take up to 1-2 weeks for results and processing. We will contact you with instructions IF your results are abnormal. Normal results will be released to your [facility]. If you have not heard from us or can not find your results in [facility] in 2 weeks please contact our office.  We recommend the following healthy lifestyle measures: - eat a healthy whole foods diet consisting of regular small meals composed of vegetables, fruits, beans, nuts, seeds, healthy meats such as white chicken and fish and whole grains.  - avoid sweets, white starchy foods, fried foods, fast food, processed foods, sodas, red meet and other fattening foods.  - get a least 150-300 minutes of aerobic exercise per week.

## 2014-11-03 NOTE — Progress Notes (Signed)
HPI:  Heather Chen is here to establish care. She had been seeing Padonda. She wants her preventive visit today. She has hyperlipidemia and takes a statin for this. She exercises a lot. She reports she does have a sweet tooth. She has some indigestion which has been going on for 3 years at night if eats certain things (chicken salad from fresh market, pesto and popcorn) - she will have nausea and throws up at least 2x per month. She does occasional also have dysphagia with solid foods - will gag and then need to cough it back up. Feel like food gets stuck in the lower throat. Denies abd pain, blood in the stools, diarrhea, constipation. She has had a stressful few years with her mother's poor health and she passed away. She is getting counseling with Ellin Mayhew.Overall she is optimistic and does not feel she needs help for this.She has seasonal allergies and takes zyrtec for this. Sees her gynecologist (Dr. Aldona Bar) for annual physicals and hot flashes. Declined genetic testing.  ROS negative for unless reported above: fevers, unintentional weight loss, hearing or vision loss, chest pain, palpitations, struggling to breath, hemoptysis, melena, hematochezia, hematuria, falls, loc, si, thoughts of self harm  Past Medical History  Diagnosis Date  . H/O seasonal allergies   . Tennis elbow   . Hyperlipidemia     Past Surgical History  Procedure Laterality Date  . Breast biopsy  1994    right; benign    Family History  Problem Relation Age of Onset  . Breast cancer Mother   . Leukemia Father 28  . Osteoarthritis Mother 85  . Rheum arthritis Mother 1    psoriatic arthritis    Social History   Social History  . Marital Status: Married    Spouse Name: vernon  . Number of Children: 0  . Years of Education: grad schoo   Social History Main Topics  . Smoking status: Former Smoker -- 10 years    Types: Cigarettes    Quit date: 01/09/1995  . Smokeless tobacco: None  . Alcohol  Use: 3.0 oz/week    5 Cans of beer per week  . Drug Use: No  . Sexual Activity: Not Asked   Other Topics Concern  . None   Social History Narrative   Work or School: none      Home Situation: lives with husband      Spiritual Beliefs: catholic      Lifestyle: regular exercise- healthy diet but has sweet tooth           Current outpatient prescriptions:  .  Cetirizine HCl (ZYRTEC PO), Take by mouth daily., Disp: , Rfl:  .  simvastatin (ZOCOR) 20 MG tablet, TAKE 1 TABLET (20 MG TOTAL) BY MOUTH AT BEDTIME., Disp: 90 tablet, Rfl: 0  EXAM:  Filed Vitals:   11/03/14 1004  BP: 120/84  Pulse: 74  Temp: 98.4 F (36.9 C)    Body mass index is 33.7 kg/(m^2).  GENERAL: vitals reviewed and listed above, alert, oriented, appears well hydrated and in no acute distress  HEENT: atraumatic, conjunttiva clear, no obvious abnormalities on inspection of external nose and ears  NECK: no obvious masses on inspection  LUNGS: clear to auscultation bilaterally, no wheezes, rales or rhonchi, good air movement  CV: HRRR, no peripheral edema  MS: moves all extremities without noticeable abnormality  PSYCH: pleasant and cooperative, no obvious depression or anxiety  ASSESSMENT AND PLAN:  Discussed the following assessment and plan:  Visit for preventive health examination - Plan: Hemoglobin A1c  Encounter to establish care  Hyperlipemia - Plan: Lipid Panel  Allergic rhinitis, unspecified allergic rhinitis type  Non-intractable vomiting with nausea, vomiting of unspecified type - Plan: Ambulatory referral to Gastroenterology  Dysphagia - Plan: Ambulatory referral to Gastroenterology  -We reviewed the PMH, PSH, FH, SH, Meds and Allergies. -We provided refills for any medications we will prescribe as needed. -We addressed current concerns per orders and patient instructions. -We have asked for records for pertinent exams, studies, vaccines and notes from previous providers. -We  have advised patient to follow up per instructions below. -Reviewed USPSTF recs, FASTING labs -she opted for a referral to GI after extensive discussion regarding potential etiologies and eval strategies and treatments for her 3 year hx of nausea, vomiting and dysphagia. -follow up in 4-6 months or sooner as needed  -Patient advised to return or notify a doctor immediately if symptoms worsen or persist or new concerns arise.  Patient Instructions  BEFORE YOU LEAVE: -labs -follow up appointment in 4-6 months  -We placed a referral for you as discussed to the Gastroenterologist regarding the nausea, vomiting and issues with swallowing. It usually takes about 1-2 weeks to process and schedule this referral. If you have not heard from us regarding this appointment in 2 weeks please contact our office.  -We have ordered labs or studies at this visit. It can take up to 1-2 weeks for results and processing. We will contact you with instructions IF your results are abnormal. Normal results will be released to your [facility]. If you have not heard from us or can not find your results in [facility] in 2 weeks please contact our office.  We recommend the following healthy lifestyle measures: - eat a healthy whole foods diet consisting of regular small meals composed of vegetables, fruits, beans, nuts, seeds, healthy meats such as white chicken and fish and whole grains.  - avoid sweets, white starchy foods, fried foods, fast food, processed foods, sodas, red meet and other fattening foods.  - get a least 150-300 minutes of aerobic exercise per week.             Kriste BasqueKIM, HANNAH R.

## 2014-11-04 ENCOUNTER — Telehealth: Payer: Self-pay | Admitting: Family Medicine

## 2014-11-04 ENCOUNTER — Encounter: Payer: Self-pay | Admitting: Internal Medicine

## 2014-11-04 MED ORDER — ROSUVASTATIN CALCIUM 20 MG PO TABS: 20.0000 mg | ORAL_TABLET | Freq: Every day | ORAL | Status: DC

## 2014-11-04 NOTE — Telephone Encounter (Signed)
Please see result note - plan to change to crestor if she is agreeable - please see result note and call pt. Thanks.

## 2014-11-04 NOTE — Telephone Encounter (Signed)
Patient informed of the results and she is aware the Rx for Crestor was sent to her pharmacy.

## 2014-11-04 NOTE — Telephone Encounter (Signed)
Pt request refill of the following:  simvastatin (ZOCOR) 20 MG tablet   Pt said she was in for her physical yesterday and forgot to mention that she needed a new rx for the above med    Phamacy:  [facility] [address]

## 2014-11-04 NOTE — Addendum Note (Signed)
Addended by: Johnella MoloneyFUNDERBURK, JO A on: 11/04/2014 10:42 AM   Modules accepted: Orders, Medications

## 2014-11-04 NOTE — Addendum Note (Signed)
Addended by: Johnella MoloneyFUNDERBURK, Jcion Buddenhagen A on: 11/04/2014 10:40 AM   Modules accepted: Orders

## 2014-11-05 ENCOUNTER — Telehealth: Payer: Self-pay | Admitting: Family Medicine

## 2014-11-05 NOTE — Telephone Encounter (Signed)
Pt call to say the following med is to expensive  rosuvastatin (CRESTOR) 20 MG tablet 189.00 generic. Pt is asking is there is any other option. Would like a call back

## 2014-11-05 NOTE — Telephone Encounter (Signed)
Patient informed of the message below and I gave her the phone number to [facility].  States she will think about this and call me back next week.

## 2014-11-05 NOTE — Telephone Encounter (Signed)
[facility] for ~ $11 per month if orders 1 year supply I believe. If she wishes could send rx there - 40mg , once daily, 1 year supply. Please advise to start with 20 mg (1/2 tab) daily for 1 month then increase to full dose. Please send rx if she wants to do this.

## 2014-11-12 ENCOUNTER — Telehealth: Payer: Self-pay | Admitting: *Deleted

## 2014-11-12 NOTE — Telephone Encounter (Signed)
Patient left a message on my voicemail stating she wanted Dr Tenny Crawoss to review her cholesterol results and asked if they could be sent to her or left at the front desk for her to pick up or mailed. I called the pt and advised her I cannot send these without a signed release and she stated she will pick the results up from the front desk.

## 2014-11-25 ENCOUNTER — Telehealth: Payer: Self-pay | Admitting: *Deleted

## 2014-11-25 DIAGNOSIS — E785 Hyperlipidemia, unspecified: Secondary | ICD-10-CM

## 2014-11-25 MED ORDER — SIMVASTATIN 20 MG PO TABS: 20.0000 mg | ORAL_TABLET | Freq: Every day | ORAL | Status: DC

## 2014-11-25 NOTE — Telephone Encounter (Signed)
Ok to send simvastatin. Advise fasting lipid panel 1 week prior to next OV,

## 2014-11-25 NOTE — Telephone Encounter (Signed)
I called the pt and informed her the Rx was sent to her pharmacy and a lab appt was scheduled for 1 week prior to the visit with Dr Selena BattenKim.

## 2014-11-25 NOTE — Telephone Encounter (Signed)
Patient left a message on my voicemail stating she has decided and prefers to stay on the Simvastatin until her next office visit with Dr Selena BattenKim so she would like for this to be called to [facility]  She also wanted to know if she needs to have the labs done prior to her appt with Dr Selena BattenKim?

## 2015-01-11 ENCOUNTER — Encounter: Payer: Self-pay | Admitting: Internal Medicine

## 2015-01-11 ENCOUNTER — Ambulatory Visit (INDEPENDENT_AMBULATORY_CARE_PROVIDER_SITE_OTHER): Payer: BLUE CROSS/BLUE SHIELD | Admitting: Internal Medicine

## 2015-01-11 VITALS — BP 142/78 | HR 84 | Ht 68.5 in | Wt 230.1 lb

## 2015-01-11 DIAGNOSIS — R1115 Cyclical vomiting syndrome unrelated to migraine: Secondary | ICD-10-CM

## 2015-01-11 DIAGNOSIS — R1310 Dysphagia, unspecified: Secondary | ICD-10-CM | POA: Diagnosis not present

## 2015-01-11 DIAGNOSIS — G43A1 Cyclical vomiting, intractable: Secondary | ICD-10-CM

## 2015-01-11 NOTE — Patient Instructions (Signed)

## 2015-01-11 NOTE — Progress Notes (Signed)
Referred by: Terressa Koyanagi, DO  Subjective:    Patient ID: Heather Chen, female    DOB: 06/28/1960, 55 y.o.   MRN: [id number] Cc: nausea and vomiting HPI The patient is a pleasant 55 year old white woman I know from prior negative screening colonoscopy. She has had problems with intermittent nausea and vomiting, without pain, for a number of years now. Probably 6 years or more. She gets a sudden warm burning feeling in her upper abdomen and mid abdomen, and then develops nausea and vomiting and it's over within 24 hours. Afterwards she feels tired and worn out but recovers within a day or 2. There is no real pain, no diarrhea, no obstructive symptoms like constipation. There is no clear trigger though she will seem to have some immediate nausea and vomiting if she eats popcorn, pest or eats chicken salad that is prepared elsewhere but not by her. He does not have symptoms in between he can go months without. 3-4 episodes a year is generally the average. She does have a history of migraines and she was younger but not anymore. Sounds like these were true migraine headaches.  Sometimes she is able to abort episodes if she takes Pepto-Bismol quite a bit when she starts feeling the warm burning sensation in her upper abdomen.   He does describe episodes or she feels like she forgets to swallow properly. It was hard to tease this out but does not seem to be any clear esophageal dysphagia or aspiration or anything like that.  She has a lot of situational stress with her family, her mom died in 05/12/2016she was in the emergency room with her mother-in-law on Christmas day and she says there is a lot of family Geronimo with her siblings. Medications, allergies, past medical history, past surgical history, family history and social history are reviewed and updated in the EMR.  Review of Systems As per history of present illness. She has a history of eczema, starting some night sweats, chronic allergies  and having insomnia which she relates to current stressors.    Objective:   Physical Exam @BP  142/78 mmHg  Pulse 84  Ht 5' 8.5" (1.74 m)  Wt 230 lb 2 oz (104.384 kg)  BMI 34.48 kg/m2  LMP 09/09/2014@  General:  Well-developed, well-nourished and in no acute distress Eyes:  anicteric. Neck:   supple w/o thyromegaly or mass.  Lungs: Clear to auscultation bilaterally. Heart:  S1S2, no rubs, murmurs, gallops. Abdomen:  soft, non-tender, no hepatosplenomegaly, hernia, or mass and BS+.  Lymph:  no cervical or supraclavicular adenopathy. Extremities:   no cyanosis or clubbing Neuro:  A&O x 3.  Psych:  appropriate mood and  Affect.   Data Reviewed: CBC in June 2015 demonstrates mild eosinophilia. Prior ones did not show that. It is otherwise normal as are the others. She has a history of a CT abdomen pelvis with contrast in 19-May-2008, having rectal lower quadrant pain, it showed a suspected 10 mm benign hemangioma of the liver.    Assessment & Plan:  Intractable cyclical vomiting with nausea - Plan: Ambulatory referral to Gastroenterology  Dysphagia   Differential diagnosis includes abdominal migraine/cyclic vomiting syndrome. Whatever it is it's chronic and she has not deteriorated or shown signs of problems otherwise. She does not have any history of abdominal surgery so adhesive obstructive disease does not seem possible. I'm not sure what to make of her dysphagia symptoms at this time I suspect she does not have any structural  esophageal problem though a large hiatal hernia could be possible nothing was noted on the CT scan in 2010.   I do think a one time endoscopy of the upper GI tract is reasonable to evaluate for any structural laterality's it might be related.The risks and benefits as well as alternatives of endoscopic procedure(s) have been discussed and reviewed. All questions answered. The patient agrees to proceed. If that is negative, will likely prescribe some anti-emetics  suppositories to be used when symptoms start to see if she can abort the nausea and vomiting.  Consider random biopsies of the upper GI tract to rule out an eosinophilic disorder though we frequently see mild elevation of eosinophils in labs so I doubt her eosinophilia is clinically relevant.  I appreciate the opportunity to care for this patient. CC: Terressa KoyanagiKIM, HANNAH R., DO

## 2015-01-14 ENCOUNTER — Encounter: Payer: Self-pay | Admitting: Internal Medicine

## 2015-01-14 ENCOUNTER — Ambulatory Visit (AMBULATORY_SURGERY_CENTER): Payer: BLUE CROSS/BLUE SHIELD | Admitting: Internal Medicine

## 2015-01-14 VITALS — BP 130/81 | HR 65 | Temp 96.4°F | Resp 17 | Ht 68.5 in | Wt 230.0 lb

## 2015-01-14 DIAGNOSIS — K253 Acute gastric ulcer without hemorrhage or perforation: Secondary | ICD-10-CM | POA: Diagnosis not present

## 2015-01-14 DIAGNOSIS — R1310 Dysphagia, unspecified: Secondary | ICD-10-CM

## 2015-01-14 DIAGNOSIS — K2950 Unspecified chronic gastritis without bleeding: Secondary | ICD-10-CM | POA: Diagnosis not present

## 2015-01-14 MED ORDER — SODIUM CHLORIDE 0.9 % IV SOLN: 500.0000 mL | INTRAVENOUS | Status: DC

## 2015-01-14 NOTE — Progress Notes (Signed)
Called to room to assist during endoscopic procedure.  Patient ID and intended procedure confirmed with present staff. Received instructions for my participation in the procedure from the performing physician.  

## 2015-01-14 NOTE — Op Note (Signed)
[facility] [address] [place], [id number]   ENDOSCOPY PROCEDURE REPORT  PATIENT: Heather Chen, Heather Chen  MR#: [id number] BIRTHDATE: 1960-02-15 , 54  yrs. old GENDER: female ENDOSCOPIST: Iva Booparl E Gessner, MD, [facility] PROCEDURE DATE:  01/14/2015 PROCEDURE:  EGD w/ biopsy ASA CLASS:     Class II INDICATIONS:  nausea, vomiting, and dysphagia. MEDICATIONS: Propofol 150 mg IV, Monitored anesthesia care, and Lidocaine 40 mg TOPICAL ANESTHETIC: none  DESCRIPTION OF PROCEDURE: After the risks benefits and alternatives of the procedure were thoroughly explained, informed consent was obtained.  The LB [id number] endoscope was introduced through the mouth and advanced to the second portion of the duodenum , Without limitations.  The instrument was slowly withdrawn as the mucosa was fully examined.    1) A few tiny pre-pylorc erosions - biopied with antrum and body that look normal 2) Otherwise normal EGD - duodenal biopsies taken. Retroflexed views revealed no abnormalities.     The scope was then withdrawn from the patient and the procedure completed.  COMPLICATIONS: There were no immediate complications.  ENDOSCOPIC IMPRESSION: 1) A few tiny pre-pylorc erosions - biopied with antrum and body that look normal 2) Otherwise normal EGD - duodenal biopsies taken  RECOMMENDATIONS: 1.  Office will call with results 2.  If biopsies unrevealing will rx anti-emetic suppositories/tablets to try to abort attacks  seems like she has cyclic vomiting syndrome/abdominal migraine syndrome by hx    eSigned:  Iva Booparl E Gessner, MD, [facility] 01/14/2015 10:42 AM    CC:The Patient        Dr. Kriste BasqueHannah Kim

## 2015-01-14 NOTE — Patient Instructions (Addendum)
There were a few small erosions (almost ulcers) in the stomach which probably have no relation to your symptoms. I  took biopsies of them, other stomach areas and the small intestine. Will let you know results and recommendations with a phone call.   I appreciate the opportunity to care for you. Iva Booparl E. Pricella Gaugh, MD, [facility]   Discharge instructions given. Biopsies taken. Resume previous medications. YOU HAD AN ENDOSCOPIC PROCEDURE TODAY AT THE [facility]:   Refer to the procedure report that was given to you for any specific questions about what was found during the examination.  If the procedure report does not answer your questions, please call your gastroenterologist to clarify.  If you requested that your care partner not be given the details of your procedure findings, then the procedure report has been included in a sealed envelope for you to review at your convenience later.  YOU SHOULD EXPECT: Some feelings of bloating in the abdomen. Passage of more gas than usual.  Walking can help get rid of the air that was put into your GI tract during the procedure and reduce the bloating. If you had a lower endoscopy (such as a colonoscopy or flexible sigmoidoscopy) you may notice spotting of blood in your stool or on the toilet paper. If you underwent a bowel prep for your procedure, you may not have a normal bowel movement for a few days.  Please Note:  You might notice some irritation and congestion in your nose or some drainage.  This is from the oxygen used during your procedure.  There is no need for concern and it should clear up in a day or so.  SYMPTOMS TO REPORT IMMEDIATELY:    Following upper endoscopy (EGD)  Vomiting of blood or coffee ground material  New chest pain or pain under the shoulder blades  Painful or persistently difficult swallowing  New shortness of breath  Fever of 100F or higher  Black, tarry-looking stools  For urgent or emergent issues, a  gastroenterologist can be reached at any hour by calling [phone].   DIET: Your first meal following the procedure should be a small meal and then it is ok to progress to your normal diet. Heavy or fried foods are harder to digest and may make you feel nauseous or bloated.  Likewise, meals heavy in dairy and vegetables can increase bloating.  Drink plenty of fluids but you should avoid alcoholic beverages for 24 hours.  ACTIVITY:  You should plan to take it easy for the rest of today and you should NOT DRIVE or use heavy machinery until tomorrow (because of the sedation medicines used during the test).    FOLLOW UP: Our staff will call the number listed on your records the next business day following your procedure to check on you and address any questions or concerns that you may have regarding the information given to you following your procedure. If we do not reach you, we will leave a message.  However, if you are feeling well and you are not experiencing any problems, there is no need to return our call.  We will assume that you have returned to your regular daily activities without incident.  If any biopsies were taken you will be contacted by phone or by letter within the next 1-3 weeks.  Please call us at [phone] if you have not heard about the biopsies in 3 weeks.    SIGNATURES/CONFIDENTIALITY: You and/or your care partner have signed  paperwork which will be entered into your electronic medical record.  These signatures attest to the fact that that the information above on your After Visit Summary has been reviewed and is understood.  Full responsibility of the confidentiality of this discharge information lies with you and/or your care-partner.

## 2015-01-14 NOTE — Progress Notes (Signed)
Stable to RR 

## 2015-01-18 ENCOUNTER — Telehealth: Payer: Self-pay | Admitting: *Deleted

## 2015-01-18 NOTE — Telephone Encounter (Signed)
  Follow up Call-  Call back number 01/14/2015  Post procedure Call Back phone  # [phone]  Permission to leave phone message Yes     Patient questions:  Do you have a fever, pain , or abdominal swelling? No. Pain Score  0 *  Have you tolerated food without any problems? Yes.    Have you been able to return to your normal activities? Yes.    Do you have any questions about your discharge instructions: Diet   No. Medications  No. Follow up visit  No.  Do you have questions or concerns about your Care? No.  Actions: * If pain score is 4 or above: No action needed, pain <4.

## 2015-01-20 NOTE — Progress Notes (Signed)
Quick Note:  Please let her know from the office that the biopsies are okay. She does have some gastritis but no infection.  If she is taking meloxicam on a daily basis or most days of the month she should take an over-the-counter omeprazole or lansoprazole daily. Think she just takes it as needed and that probably caused the erosions.  Or her intermittent spells of nausea and vomiting I don't think there is any other study to do at this point. I recommend treatment with ondansetron 4 mg orally disintegrating tablets 1-2 every 6 hours when necessary #12 with 3 refills and Compazine generic suppositories 25 mg 1 every 12 hours when necessary #12 with 3 refills I think she can see me as needed  Let her or recall from that L EC ______

## 2015-01-21 ENCOUNTER — Other Ambulatory Visit: Payer: Self-pay

## 2015-01-21 MED ORDER — PROCHLORPERAZINE 25 MG RE SUPP: 25.0000 mg | Freq: Two times a day (BID) | RECTAL | Status: DC | PRN

## 2015-01-21 MED ORDER — ONDANSETRON 4 MG PO TBDP: ORAL_TABLET | ORAL | Status: DC

## 2015-01-24 ENCOUNTER — Ambulatory Visit
Admission: RE | Admit: 2015-01-24 | Discharge: 2015-01-24 | Disposition: A | Discharge status: Home / Self Care | Payer: BLUE CROSS/BLUE SHIELD | Source: Ambulatory Visit

## 2015-01-24 DIAGNOSIS — Z1231 Encounter for screening mammogram for malignant neoplasm of breast: Secondary | ICD-10-CM

## 2015-02-24 ENCOUNTER — Other Ambulatory Visit (INDEPENDENT_AMBULATORY_CARE_PROVIDER_SITE_OTHER): Payer: BLUE CROSS/BLUE SHIELD

## 2015-02-24 DIAGNOSIS — E785 Hyperlipidemia, unspecified: Secondary | ICD-10-CM | POA: Diagnosis not present

## 2015-02-24 LAB — LIPID PANEL
CHOL/HDL RATIO: 4
CHOLESTEROL: 215 mg/dL — AB (ref 0–200)
HDL: 48.1 mg/dL (ref >39.00)
NonHDL: 167.03
Triglycerides: 240 mg/dL — ABNORMAL HIGH (ref 0.0–149.0)
VLDL: 48 mg/dL — AB (ref 0.0–40.0)

## 2015-02-24 LAB — LDL CHOLESTEROL, DIRECT: Direct LDL: 128 mg/dL

## 2015-03-03 ENCOUNTER — Ambulatory Visit: Payer: BLUE CROSS/BLUE SHIELD | Admitting: Family Medicine

## 2015-03-26 ENCOUNTER — Other Ambulatory Visit: Payer: Self-pay | Admitting: Family Medicine

## 2015-05-30 ENCOUNTER — Encounter: Payer: Self-pay | Admitting: Family Medicine

## 2015-05-30 ENCOUNTER — Ambulatory Visit (INDEPENDENT_AMBULATORY_CARE_PROVIDER_SITE_OTHER): Payer: BLUE CROSS/BLUE SHIELD | Admitting: Family Medicine

## 2015-05-30 VITALS — BP 118/80 | HR 65 | Temp 98.6°F | Ht 68.5 in | Wt 223.8 lb

## 2015-05-30 DIAGNOSIS — G43D0 Abdominal migraine, not intractable: Secondary | ICD-10-CM

## 2015-05-30 DIAGNOSIS — F4323 Adjustment disorder with mixed anxiety and depressed mood: Secondary | ICD-10-CM | POA: Diagnosis not present

## 2015-05-30 DIAGNOSIS — E785 Hyperlipidemia, unspecified: Secondary | ICD-10-CM | POA: Diagnosis not present

## 2015-05-30 DIAGNOSIS — J309 Allergic rhinitis, unspecified: Secondary | ICD-10-CM

## 2015-05-30 HISTORY — DX: Abdominal migraine, not intractable: G43.D0

## 2015-05-30 NOTE — Progress Notes (Signed)
Pre visit review using our clinic review tool, if applicable. No additional management support is needed unless otherwise documented below in the visit note. 

## 2015-05-30 NOTE — Patient Instructions (Signed)
BEFORE YOU LEAVE: -schedule follow up in about 6 months; fasting lipid panel the week prior  We recommend the following healthy lifestyle measures: - eat a healthy whole foods diet consisting of small portions of vegetables, fruits, beans, nuts, seeds, healthy meats such as white chicken and fish and whole grains.  - avoid sweets, white starchy foods, fried foods, fast food, processed foods, sodas, red meet  - get a least 150-300 minutes of aerobic exercise per week.   Consider the weight watchers, [place], Wednesday nights

## 2015-05-30 NOTE — Progress Notes (Signed)
HPI:  Heather PlattMargaret M Chen is a pleasant 55 yo, recent [facility] from [facility] here for follow up:  Hyperlipidemia: -meds: symvastatin 20mg  -exercises on a regular basis and is very active, but admits that her diet is not great, she does want to become better about working on the diet portion  GERD/Indigestion/Dysphagia: -seeing Dr. Leone PayorGessner -EGD 01/2015, Colonoscopy 03/2012 -some gastritis on EGD, she was advise OTC PPI if using nsaids - uses very rarely -dx cyclic vomiting or abd migraines and per GI anti-emetics prn advised; reports hx migraines, has not needed any meds in a long time, used the anti-emetics once  Seasonal allergies: -takes zyrtec  Adjustment Disorder: -mother passed -seeing counselor, Ellin MayhewSheryl Ketner -wonders if melatonin safe long term  Hot Flashes: -sees gyn, Dr. Aldona BarWein  ROS: See pertinent positives and negatives per HPI.  Past Medical History  Diagnosis Date  . H/O seasonal allergies   . Tennis elbow   . Hyperlipidemia   . Anemia   . GERD (gastroesophageal reflux disease)   . Migraine headache   . Allergy     SEASONAL  . Heart murmur     AS A CHILD  . Abdominal migraine, not intractable 05/30/2015    Past Surgical History  Procedure Laterality Date  . Breast biopsy Right 1994    benign  . Colonoscopy      Family History  Problem Relation Age of Onset  . Breast cancer Mother   . Osteoarthritis Mother 3050  . Rheum arthritis Mother 50    psoriatic arthritis, osteoarthritis  . Lymphoma Mother   . Leukemia Father 1556    deceased  . Osteoarthritis Sister   . Colon polyps Sister   . Heart disease Paternal Grandmother   . Breast cancer Maternal Grandmother   . Breast cancer Maternal Aunt     x 2    Social History   Social History  . Marital Status: Married    Spouse Name: vernon  . Number of Children: 0  . Years of Education: grad schoo   Occupational History  . CPA     Social History Main Topics  . Smoking status: Former Smoker  -- 10 years    Types: Cigarettes    Quit date: 01/09/1995  . Smokeless tobacco: Never Used  . Alcohol Use: 3.0 oz/week    5 Cans of beer per week  . Drug Use: No  . Sexual Activity: Not Asked   Other Topics Concern  . None   Social History Narrative   Work or School: none      Home Situation: lives with husband      Spiritual Beliefs: catholic      Lifestyle: regular exercise- healthy diet but has sweet tooth           Current outpatient prescriptions:  .  Cetirizine HCl (ZYRTEC PO), Take 10 mg by mouth daily. , Disp: , Rfl:  .  ondansetron (ZOFRAN ODT) 4 MG disintegrating tablet, Take one or 2 every six hours as needed for nasuea, Disp: 12 tablet, Rfl: 3 .  prochlorperazine (COMPAZINE) 25 MG suppository, Place 1 suppository (25 mg total) rectally every 12 (twelve) hours as needed for nausea or vomiting., Disp: 12 suppository, Rfl: 3 .  simvastatin (ZOCOR) 20 MG tablet, TAKE 1 TABLET (20 MG TOTAL) BY MOUTH AT BEDTIME., Disp: 90 tablet, Rfl: 0  EXAM:  Filed Vitals:   05/30/15 1116  BP: 118/80  Pulse: 65  Temp: 98.6 F (37 C)    Body  mass index is 33.53 kg/(m^2).  GENERAL: vitals reviewed and listed above, alert, oriented, appears well hydrated and in no acute distress  HEENT: atraumatic, conjunttiva clear, no obvious abnormalities on inspection of external nose and ears  NECK: no obvious masses on inspection  LUNGS: clear to auscultation bilaterally, no wheezes, rales or rhonchi, good air movement  CV: HRRR, no peripheral edema  MS: moves all extremities without noticeable abnormality  PSYCH: pleasant and cooperative, no obvious depression or anxiety  ASSESSMENT AND PLAN:  Discussed the following assessment and plan:  Hyperlipemia  Adjustment disorder with mixed anxiety and depressed mood  Allergic rhinitis, unspecified allergic rhinitis type  Abdominal migraine, not intractable  -lifestyle recs; discussed various formal options for assisting her in  eating a healthy diet and weight loss, she may consider Weight Watchers -Discussed cholesterol and she opted to stay on current dose of statin and work aggressively on the diet -She will move her Zyrtec to the evening, which should help with sleep and will avoid the need for melatonin at this point hopefully -Patient advised to return or notify a doctor immediately if symptoms worsen or persist or new concerns arise.  Patient Instructions  BEFORE YOU LEAVE: -schedule follow up in about 6 months; fasting lipid panel the week prior  We recommend the following healthy lifestyle measures: - eat a healthy whole foods diet consisting of small portions of vegetables, fruits, beans, nuts, seeds, healthy meats such as white chicken and fish and whole grains.  - avoid sweets, white starchy foods, fried foods, fast food, processed foods, sodas, red meet  - get a least 150-300 minutes of aerobic exercise per week.   Consider the weight watchers, [place], Wednesday nights     Kynsie Falkner, Dahlia Client R.

## 2015-06-02 ENCOUNTER — Ambulatory Visit: Payer: BLUE CROSS/BLUE SHIELD | Admitting: Family Medicine

## 2015-07-07 ENCOUNTER — Other Ambulatory Visit: Payer: Self-pay | Admitting: Family Medicine

## 2015-07-07 MED ORDER — SIMVASTATIN 20 MG PO TABS: ORAL_TABLET | ORAL | Status: DC

## 2015-07-07 NOTE — Telephone Encounter (Signed)
Sent to the pharmacy by e-scribe.  Pt is currently on the schedule for 11/21/15 for follow up

## 2015-07-15 ENCOUNTER — Other Ambulatory Visit: Payer: Self-pay | Admitting: *Deleted

## 2015-09-22 DIAGNOSIS — D225 Melanocytic nevi of trunk: Secondary | ICD-10-CM | POA: Diagnosis not present

## 2015-09-22 DIAGNOSIS — L821 Other seborrheic keratosis: Secondary | ICD-10-CM | POA: Diagnosis not present

## 2015-09-22 DIAGNOSIS — D1800 Hemangioma unspecified site: Secondary | ICD-10-CM | POA: Diagnosis not present

## 2015-09-22 DIAGNOSIS — L814 Other melanin hyperpigmentation: Secondary | ICD-10-CM | POA: Diagnosis not present

## 2015-09-23 DIAGNOSIS — Z23 Encounter for immunization: Secondary | ICD-10-CM | POA: Diagnosis not present

## 2015-11-14 ENCOUNTER — Other Ambulatory Visit (INDEPENDENT_AMBULATORY_CARE_PROVIDER_SITE_OTHER): Payer: BLUE CROSS/BLUE SHIELD

## 2015-11-14 DIAGNOSIS — E785 Hyperlipidemia, unspecified: Secondary | ICD-10-CM | POA: Diagnosis not present

## 2015-11-14 LAB — LIPID PANEL
Cholesterol: 219 mg/dL — ABNORMAL HIGH (ref 0–200)
HDL: 51.8 mg/dL (ref >39.00)
LDL CALC: 133 mg/dL — AB (ref 0–99)
NONHDL: 167.29
Total CHOL/HDL Ratio: 4
Triglycerides: 173 mg/dL — ABNORMAL HIGH (ref 0.0–149.0)
VLDL: 34.6 mg/dL (ref 0.0–40.0)

## 2015-11-18 ENCOUNTER — Other Ambulatory Visit: Payer: Self-pay | Admitting: Obstetrics & Gynecology

## 2015-11-18 DIAGNOSIS — Z1231 Encounter for screening mammogram for malignant neoplasm of breast: Secondary | ICD-10-CM

## 2015-11-21 ENCOUNTER — Ambulatory Visit: Payer: BLUE CROSS/BLUE SHIELD | Admitting: Family Medicine

## 2015-11-25 ENCOUNTER — Encounter: Payer: Self-pay | Admitting: Family Medicine

## 2015-11-25 ENCOUNTER — Ambulatory Visit (INDEPENDENT_AMBULATORY_CARE_PROVIDER_SITE_OTHER): Payer: BLUE CROSS/BLUE SHIELD | Admitting: Family Medicine

## 2015-11-25 VITALS — BP 100/70 | HR 92 | Temp 98.2°F | Ht 68.5 in | Wt 227.6 lb

## 2015-11-25 DIAGNOSIS — Z6834 Body mass index (BMI) 34.0-34.9, adult: Secondary | ICD-10-CM | POA: Diagnosis not present

## 2015-11-25 DIAGNOSIS — Z1159 Encounter for screening for other viral diseases: Secondary | ICD-10-CM | POA: Diagnosis not present

## 2015-11-25 DIAGNOSIS — Z780 Asymptomatic menopausal state: Secondary | ICD-10-CM | POA: Diagnosis not present

## 2015-11-25 DIAGNOSIS — E785 Hyperlipidemia, unspecified: Secondary | ICD-10-CM

## 2015-11-25 MED ORDER — SIMVASTATIN 40 MG PO TABS: ORAL_TABLET | ORAL | 3 refills | Status: DC

## 2015-11-25 NOTE — Progress Notes (Signed)
Pre visit review using our clinic review tool, if applicable. No additional management support is needed unless otherwise documented below in the visit note. 

## 2015-11-25 NOTE — Patient Instructions (Signed)
BEFORE YOU LEAVE: -follow up: 4 months  Sign up for weight watchers to help with the diet.  Increase aerobic exercise - at least get 150 minutes per week of aerobic exercise.  Increase cholesterol medication (simvastatin) to 40mg  daily.   We recommend the following healthy lifestyle for LIFE: 1) Small portions.   Tip: eat off of a salad plate instead of a dinner plate.  Tip: It is ok to feel hungry sometimes if eating proper portions  Tip: if you need more or a snack choose fruits, veggies and/or a handful of nuts or seeds.  2) Eat a healthy clean diet.  * Tip: Avoid (less then 1 serving per week): processed foods, sweets, sweetened drinks, white starches (rice, flour, bread, potatoes, pasta, etc), red meat, fast foods, butter  *Tip: CHOOSE instead   * 5-9 servings per day of fresh or frozen fruits and vegetables (but not corn, potatoes, bananas, canned or dried fruit)   *nuts and seeds, beans   *olives and olive oil   *small portions of lean meats such as fish and white chicken    *small portions of whole grains  3)Get at least 150 minutes of sweaty aerobic exercise per week.  4)Reduce stress - consider counseling, meditation and relaxation to balance other aspects of your life.

## 2015-11-25 NOTE — Progress Notes (Signed)
HPI:  Heather Chen is here for follow-up on several issues. She admits she has not done as well as she would've liked with her weight and the cholesterol issues. She does walk about 5-12 miles per week, but this is sometimes slow walking. She eats out a lot and admits that her diet is a big problem. She does wish to lose weight but does lack of motivation. She would like to consider increasing or changing her cholesterol medicine. She also reports that gynecologist confirm that she is going through menopause and this does cause some irritable mood and feeling on edge at times. No heart dz, HTN, smoking, depression, sig FH.  ROS: See pertinent positives and negatives per HPI.  Past Medical History:  Diagnosis Date  . Abdominal migraine, not intractable 05/30/2015  . Allergy    SEASONAL  . Anemia   . GERD (gastroesophageal reflux disease)   . H/O seasonal allergies   . Heart murmur    AS A CHILD  . Hyperlipidemia   . Migraine headache   . Tennis elbow     Past Surgical History:  Procedure Laterality Date  . BREAST BIOPSY Right 1994   benign  . COLONOSCOPY      Family History  Problem Relation Age of Onset  . Breast cancer Mother   . Osteoarthritis Mother 6750  . Rheum arthritis Mother 50    psoriatic arthritis, osteoarthritis  . Lymphoma Mother   . Leukemia Father [id number]    deceased  . Osteoarthritis Sister   . Colon polyps Sister   . Heart disease Paternal Grandmother   . Breast cancer Maternal Grandmother   . Breast cancer Maternal Aunt     x 2    Social History   Social History  . Marital status: Married    Spouse name: vernon  . Number of children: 0  . Years of education: grad schoo   Occupational History  . CPA     Social History Main Topics  . Smoking status: Former Smoker    Years: 10.00    Types: Cigarettes    Quit date: 01/09/1995  . Smokeless tobacco: Never Used  . Alcohol use 3.0 oz/week    5 Cans of beer per week  . Drug use: No  . Sexual  activity: Not Asked   Other Topics Concern  . None   Social History Narrative   Work or School: none      Home Situation: lives with husband      Spiritual Beliefs: catholic      Lifestyle: regular exercise- healthy diet but has sweet tooth           Current Outpatient Prescriptions:  .  Cetirizine HCl (ZYRTEC PO), Take 10 mg by mouth daily. , Disp: , Rfl:  .  prochlorperazine (COMPAZINE) 25 MG suppository, Place 1 suppository (25 mg total) rectally every 12 (twelve) hours as needed for nausea or vomiting., Disp: 12 suppository, Rfl: 3 .  simvastatin (ZOCOR) 20 MG tablet, TAKE 1 TABLET (20 MG TOTAL) BY MOUTH AT BEDTIME., Disp: 90 tablet, Rfl: 1  EXAM:  Vitals:   11/25/15 1305  BP: 100/70  Pulse: 92  Temp: 98.2 F (36.8 C)    Body mass index is 34.1 kg/m.  GENERAL: vitals reviewed and listed above, alert, oriented, appears well hydrated and in no acute distress  HEENT: atraumatic, conjunttiva clear, no obvious abnormalities on inspection of external nose and ears  NECK: no obvious masses on inspection  LUNGS:  clear to auscultation bilaterally, no wheezes, rales or rhonchi, good air movement  CV: HRRR, no peripheral edema  MS: moves all extremities without noticeable abnormality  PSYCH: pleasant and cooperative, no obvious depression or anxiety  ASSESSMENT AND PLAN:  Discussed the following assessment and plan:  Hyperlipidemia, unspecified hyperlipidemia type  BMI 34.0-34.9,adult  Menopause  -Lifestyle recommendations discussed at length and she may try the Weight Watchers program to assist with the diet and try to increase her aerobic exercise -She opted to increase her simvastatin to 40 mg daily and -Follow up in 4 months and we'll plan to recheck her cholesterol then along with her hepatitis C screening per her preference -Patient advised to return or notify a doctor immediately if symptoms worsen or persist or new concerns arise.  Patient  Instructions  BEFORE YOU LEAVE: -follow up: 4 months  Sign up for weight watchers to help with the diet.  Increase aerobic exercise - at least get 150 minutes per week of aerobic exercise.  Increase cholesterol medication (simvastatin) to 40mg  daily.   We recommend the following healthy lifestyle for LIFE: 1) Small portions.   Tip: eat off of a salad plate instead of a dinner plate.  Tip: It is ok to feel hungry sometimes if eating proper portions  Tip: if you need more or a snack choose fruits, veggies and/or a handful of nuts or seeds.  2) Eat a healthy clean diet.  * Tip: Avoid (less then 1 serving per week): processed foods, sweets, sweetened drinks, white starches (rice, flour, bread, potatoes, pasta, etc), red meat, fast foods, butter  *Tip: CHOOSE instead   * 5-9 servings per day of fresh or frozen fruits and vegetables (but not corn, potatoes, bananas, canned or dried fruit)   *nuts and seeds, beans   *olives and olive oil   *small portions of lean meats such as fish and white chicken    *small portions of whole grains  3)Get at least 150 minutes of sweaty aerobic exercise per week.  4)Reduce stress - consider counseling, meditation and relaxation to balance other aspects of your life.     Kriste BasqueKIM, Latravion Graves R., DO

## 2016-01-16 ENCOUNTER — Other Ambulatory Visit: Payer: Self-pay | Admitting: Obstetrics & Gynecology

## 2016-01-16 DIAGNOSIS — Z6833 Body mass index (BMI) 33.0-33.9, adult: Secondary | ICD-10-CM | POA: Diagnosis not present

## 2016-01-16 DIAGNOSIS — Z01419 Encounter for gynecological examination (general) (routine) without abnormal findings: Secondary | ICD-10-CM | POA: Diagnosis not present

## 2016-01-16 DIAGNOSIS — Z124 Encounter for screening for malignant neoplasm of cervix: Secondary | ICD-10-CM | POA: Diagnosis not present

## 2016-01-16 LAB — HM PAP SMEAR

## 2016-01-17 LAB — CYTOLOGY - PAP

## 2016-01-25 ENCOUNTER — Ambulatory Visit: Payer: BLUE CROSS/BLUE SHIELD

## 2016-02-15 ENCOUNTER — Encounter: Payer: Self-pay | Admitting: Radiology

## 2016-02-15 ENCOUNTER — Ambulatory Visit
Admission: RE | Admit: 2016-02-15 | Discharge: 2016-02-15 | Disposition: A | Discharge status: Home / Self Care | Payer: BLUE CROSS/BLUE SHIELD | Source: Ambulatory Visit | Attending: Obstetrics & Gynecology | Admitting: Obstetrics & Gynecology

## 2016-02-15 DIAGNOSIS — Z1231 Encounter for screening mammogram for malignant neoplasm of breast: Secondary | ICD-10-CM

## 2016-03-16 ENCOUNTER — Telehealth: Payer: Self-pay | Admitting: Family Medicine

## 2016-03-16 DIAGNOSIS — Z7289 Other problems related to lifestyle: Secondary | ICD-10-CM

## 2016-03-16 DIAGNOSIS — E785 Hyperlipidemia, unspecified: Secondary | ICD-10-CM

## 2016-03-16 NOTE — Telephone Encounter (Signed)
Pt states she though she was to have fasting labs prior to her appt next Thursday. Please advise if ok to schedule. Pt's appt is not until 3/15 at2:30 pm

## 2016-03-16 NOTE — Telephone Encounter (Signed)
Ok to schedule fasting lab visit a few days prior. I placed orders.  Thanks.

## 2016-03-22 ENCOUNTER — Ambulatory Visit: Payer: BLUE CROSS/BLUE SHIELD | Admitting: Family Medicine

## 2016-04-05 NOTE — Telephone Encounter (Signed)
Pt decided to reschedule all appts for later this summer.

## 2016-07-19 ENCOUNTER — Other Ambulatory Visit (INDEPENDENT_AMBULATORY_CARE_PROVIDER_SITE_OTHER): Payer: BLUE CROSS/BLUE SHIELD

## 2016-07-19 DIAGNOSIS — Z1159 Encounter for screening for other viral diseases: Secondary | ICD-10-CM | POA: Diagnosis not present

## 2016-07-19 DIAGNOSIS — E785 Hyperlipidemia, unspecified: Secondary | ICD-10-CM

## 2016-07-19 DIAGNOSIS — Z7289 Other problems related to lifestyle: Secondary | ICD-10-CM

## 2016-07-19 LAB — LIPID PANEL
CHOL/HDL RATIO: 4
CHOLESTEROL: 174 mg/dL (ref 0–200)
HDL: 47.7 mg/dL (ref >39.00)
LDL CALC: 107 mg/dL — AB (ref 0–99)
NonHDL: 126.21
TRIGLYCERIDES: 95 mg/dL (ref 0.0–149.0)
VLDL: 19 mg/dL (ref 0.0–40.0)

## 2016-07-20 LAB — HEPATITIS C ANTIBODY: HCV AB: NEGATIVE

## 2016-07-24 ENCOUNTER — Ambulatory Visit (INDEPENDENT_AMBULATORY_CARE_PROVIDER_SITE_OTHER): Payer: BLUE CROSS/BLUE SHIELD | Admitting: Family Medicine

## 2016-07-24 ENCOUNTER — Encounter: Payer: Self-pay | Admitting: Family Medicine

## 2016-07-24 VITALS — BP 100/64 | HR 65 | Temp 98.5°F | Ht 67.75 in | Wt 196.1 lb

## 2016-07-24 DIAGNOSIS — Z0000 Encounter for general adult medical examination without abnormal findings: Secondary | ICD-10-CM | POA: Diagnosis not present

## 2016-07-24 DIAGNOSIS — Z6830 Body mass index (BMI) 30.0-30.9, adult: Secondary | ICD-10-CM | POA: Diagnosis not present

## 2016-07-24 DIAGNOSIS — E785 Hyperlipidemia, unspecified: Secondary | ICD-10-CM | POA: Diagnosis not present

## 2016-07-24 NOTE — Progress Notes (Signed)
HPI:  Here for CPE:  -Concerns and/or follow up today:   PMH Obesity, HLD, GERD, Allergies, Adjustment disorder, Hot flashes. She has been eating healthier (wt watchers) and exercising and is happy to report is loosing wt. Tolerating statin and recent labs show improvement in cholesterol.  -sees GI, counselor and gyn, also does skin checks with dermatologist.  -Taking folic acid, vitamin D or calcium: no  -Diabetes and Dyslipidemia Screening: done  -Vaccines: UTD  -pap history: does gyn exams with Dr. Aldona Bar, gyn  -FDLMP: n/a  -sexual activity: yes, female partner, no new partners  -wants STI testing (Hep C if born [birth year]): no  -FH breast, colon or ovarian ca: see FH Last mammogram:birads 1 02/2016 Last colon cancer screening: colonoscopy done 03/2012 with Dr. Leone Payor   -Alcohol, Tobacco, drug use: see social history  Review of Systems - no fevers, unintentional weight loss, vision loss, hearing loss, chest pain, sob, hemoptysis, melena, hematochezia, hematuria, genital discharge, changing or concerning skin lesions, bleeding, bruising, loc, thoughts of self harm or SI  Past Medical History:  Diagnosis Date  . Abdominal migraine, not intractable 05/30/2015  . Allergy    SEASONAL  . Anemia   . GERD (gastroesophageal reflux disease)   . H/O seasonal allergies   . Heart murmur    AS A CHILD  . Hyperlipidemia   . Migraine headache   . Tennis elbow     Past Surgical History:  Procedure Laterality Date  . BREAST BIOPSY Right 1994   benign  . COLONOSCOPY      Family History  Problem Relation Age of Onset  . Breast cancer Mother   . Osteoarthritis Mother 39  . Rheum arthritis Mother 50       psoriatic arthritis, osteoarthritis  . Lymphoma Mother   . Leukemia Father 55       deceased  . Osteoarthritis Sister   . Colon polyps Sister   . Heart disease Paternal Grandmother   . Breast cancer Maternal Grandmother   . Breast cancer Maternal Aunt        x 2     Social History   Social History  . Marital status: Married    Spouse name: vernon  . Number of children: 0  . Years of education: grad schoo   Occupational History  . CPA     Social History Main Topics  . Smoking status: Former Smoker    Years: 10.00    Types: Cigarettes    Quit date: 01/09/1995  . Smokeless tobacco: Never Used  . Alcohol use 3.0 oz/week    5 Cans of beer per week  . Drug use: No  . Sexual activity: Not Asked   Other Topics Concern  . None   Social History Narrative   Work or School: none      Home Situation: lives with husband      Spiritual Beliefs: catholic      Lifestyle: regular exercise- healthy diet but has sweet tooth           Current Outpatient Prescriptions:  .  Cetirizine HCl (ZYRTEC PO), Take 10 mg by mouth daily. , Disp: , Rfl:  .  simvastatin (ZOCOR) 40 MG tablet, TAKE 1 TABLET (40 MG TOTAL) BY MOUTH AT BEDTIME., Disp: 90 tablet, Rfl: 3  EXAM:  Vitals:   07/24/16 0924  BP: 100/64  Pulse: 65  Temp: 98.5 F (36.9 C)    GENERAL: vitals reviewed and listed below, alert, oriented, appears well hydrated  and in no acute distress  HEENT: head atraumatic, PERRLA, normal appearance of eyes, ears, nose and mouth. moist mucus membranes.  NECK: supple, no masses or lymphadenopathy  LUNGS: clear to auscultation bilaterally, no rales, rhonchi or wheeze  CV: HRRR, no peripheral edema or cyanosis, normal pedal pulses  ABDOMEN: bowel sounds normal, soft, non tender to palpation, no masses, no rebound or guarding  SKIN: no rash or abnormal lesions - declined full check - sees dermatologist  GU/BREAST: does with gyn  MS: normal gait, moves all extremities normally  NEURO: normal gait, speech and thought processing grossly intact, muscle tone grossly intact throughout  PSYCH: normal affect, pleasant and cooperative  ASSESSMENT AND PLAN:  Discussed the following assessment and plan:  Encounter for preventive health  examination  Hyperlipidemia, unspecified hyperlipidemia type  BMI 30.0-30.9,adult  -reviewed labs  -Discussed and advised all [place] preventive services health task force level A and B recommendations for age, sex and risks.  -Advised at least 150 minutes of exercise per week and a healthy diet with avoidance of (less then 1 serving per week) processed foods, white starches, red meat, fast foods and sweets and consisting of: * 5-9 servings of fresh fruits and vegetables (not corn or potatoes) *nuts and seeds, beans *olives and olive oil *lean meats such as fish and white chicken  *whole grains  -labs, studies and vaccines per orders this encounter  No orders of the defined types were placed in this encounter.   Patient advised to return to clinic immediately if symptoms worsen or persist or new concerns.  There are no Patient Instructions on file for this visit.  No Follow-up on file.  Kriste BasqueKIM, Aleeza [place] R., DO

## 2016-08-07 ENCOUNTER — Telehealth: Payer: Self-pay | Admitting: Family Medicine

## 2016-08-07 DIAGNOSIS — J029 Acute pharyngitis, unspecified: Secondary | ICD-10-CM | POA: Diagnosis not present

## 2016-08-07 DIAGNOSIS — J020 Streptococcal pharyngitis: Secondary | ICD-10-CM | POA: Diagnosis not present

## 2016-08-07 NOTE — Telephone Encounter (Signed)
Would advise evaluation prior to any prescription medications. UCC if is feeling quite sick and can't wait until returns.

## 2016-08-07 NOTE — Telephone Encounter (Signed)
Heather EvensJoanne pt would like to have a call on a personal matter.

## 2016-08-07 NOTE — Telephone Encounter (Signed)
I called the pt and informed her of the message below and she stated she will seek treatment in [place].

## 2016-08-07 NOTE — Telephone Encounter (Signed)
I called the pt and she stated she is in [place] and thinks she may be getting an ear infection due to sore throat, left ear pain and congestion she has had for the past 3 days, also complains of a sore throat.  Questioned if Dr Selena BattenKim would send in a medication to a pharmacy there for her.  Message sent to Dr Selena BattenKim.

## 2016-08-14 DIAGNOSIS — H6502 Acute serous otitis media, left ear: Secondary | ICD-10-CM | POA: Diagnosis not present

## 2016-08-14 DIAGNOSIS — B085 Enteroviral vesicular pharyngitis: Secondary | ICD-10-CM | POA: Diagnosis not present

## 2016-08-14 DIAGNOSIS — R070 Pain in throat: Secondary | ICD-10-CM | POA: Diagnosis not present

## 2016-08-20 ENCOUNTER — Ambulatory Visit (INDEPENDENT_AMBULATORY_CARE_PROVIDER_SITE_OTHER): Payer: BLUE CROSS/BLUE SHIELD | Admitting: Family Medicine

## 2016-08-20 ENCOUNTER — Encounter: Payer: Self-pay | Admitting: Family Medicine

## 2016-08-20 VITALS — BP 118/80 | HR 72 | Temp 99.1°F | Ht 67.75 in | Wt 197.4 lb

## 2016-08-20 DIAGNOSIS — Z01118 Encounter for examination of ears and hearing with other abnormal findings: Secondary | ICD-10-CM

## 2016-08-20 DIAGNOSIS — J029 Acute pharyngitis, unspecified: Secondary | ICD-10-CM

## 2016-08-20 DIAGNOSIS — H9192 Unspecified hearing loss, left ear: Secondary | ICD-10-CM

## 2016-08-20 LAB — POCT RAPID STREP A (OFFICE): RAPID STREP A SCREEN: NEGATIVE

## 2016-08-20 NOTE — Patient Instructions (Addendum)
BEFORE YOU LEAVE: -rapid and strep culture  -We placed a referral for you as discussed to the Ear, nose and throat specialist. It usually takes about 1 week to process and schedule this referral. If you have not heard from us regarding this appointment in 1 week please contact our office.  -Can use salt water gargles, throat lozenges, tylenol or ibuprofen per instructions as needed in the interim for symptoms. Zyrtec nightly.  I hope you are feeling better soon! Seek care in the interim if worsening, new concerns or you are not improving with treatment.

## 2016-08-20 NOTE — Addendum Note (Signed)
Addended by: Johnella MoloneyFUNDERBURK, JO A on: 08/20/2016 03:55 PM   Modules accepted: Orders

## 2016-08-20 NOTE — Progress Notes (Signed)
HPI:  Acute visit for sore throat: -started:yesterday -symptoms: drainage, sore throat, decreased hearing L -denies:fever, SOB, NVD, tooth pain, cough -has tried: had strep a few weeks ago and took amoxicillin, then developed ear issues and reports was told she had adult hand foot and mouth disease and was treated with prednisone for "fluid in the L ear" -most symptoms resolved with treatment -Hx of: allergies and anxiety  ROS: See pertinent positives and negatives per HPI.  Past Medical History:  Diagnosis Date  . Abdominal migraine, not intractable 05/30/2015  . Allergy    SEASONAL  . Anemia   . GERD (gastroesophageal reflux disease)   . H/O seasonal allergies   . Heart murmur    AS A CHILD  . Hyperlipidemia   . Migraine headache   . Tennis elbow     Past Surgical History:  Procedure Laterality Date  . BREAST BIOPSY Right 1994   benign  . COLONOSCOPY      Family History  Problem Relation Age of Onset  . Breast cancer Mother   . Osteoarthritis Mother 23  . Rheum arthritis Mother 50       psoriatic arthritis, osteoarthritis  . Lymphoma Mother   . Leukemia Father 83       deceased  . Osteoarthritis Sister   . Colon polyps Sister   . Heart disease Paternal Grandmother   . Breast cancer Maternal Grandmother   . Breast cancer Maternal Aunt        x 2    Social History   Social History  . Marital status: Married    Spouse name: vernon  . Number of children: 0  . Years of education: grad schoo   Occupational History  . CPA     Social History Main Topics  . Smoking status: Former Smoker    Years: 10.00    Types: Cigarettes    Quit date: 01/09/1995  . Smokeless tobacco: Never Used  . Alcohol use 3.0 oz/week    5 Cans of beer per week  . Drug use: No  . Sexual activity: Not Asked   Other Topics Concern  . None   Social History Narrative   Work or School: none      Home Situation: lives with husband      Spiritual Beliefs: catholic      Lifestyle: regular exercise- healthy diet but has sweet tooth           Current Outpatient Prescriptions:  .  Cetirizine HCl (ZYRTEC PO), Take 10 mg by mouth daily. , Disp: , Rfl:  .  simvastatin (ZOCOR) 40 MG tablet, TAKE 1 TABLET (40 MG TOTAL) BY MOUTH AT BEDTIME., Disp: 90 tablet, Rfl: 3  EXAM:  Vitals:   08/20/16 1455  BP: 118/80  Pulse: 72  Temp: 99.1 F (37.3 C)  SpO2: 98%    Body mass index is 30.24 kg/m.  GENERAL: vitals reviewed and listed above, alert, oriented, appears well hydrated and in no acute distress  HEENT: atraumatic, conjunttiva clear, no obvious abnormalities on inspection of external nose and ears, normal appearance of ear canals and TMs except for clear effusions bilaterally, ? small bullous lesions R TM and questionable defect TM or behind TM L ear at 3 O'clock, clear nasal congestion, mild post oropharyngeal erythema with PND, no tonsillar edema or exudate, no sinus TTP  NECK: no obvious masses on inspection  LUNGS: clear to auscultation bilaterally, no wheezes, rales or rhonchi, good air movement  CV: HRRR,  no peripheral edema  MS: moves all extremities without noticeable abnormality  PSYCH: pleasant and cooperative, no obvious depression or anxiety  ASSESSMENT AND PLAN:  Discussed the following assessment and plan:  Sore throat - Plan: Ambulatory referral to ENT  Decreased hearing of left ear - Plan: Ambulatory referral to ENT  Abnormal exam of left ear - Plan: Ambulatory referral to ENT  -given HPI and exam findings today, a serious infection or illness is unlikely. We discussed potential etiologies, with viral illness being most likely, and advised supportive care and monitoring. Will check strep testing. Unsure of findings on TMs and significance and will have her see ENT for further evaluation this and her ongoing symptoms. Symptomatic OTC care in the interim. We discussed treatment side effects, likely course, antibiotic misuse,  transmission, and signs of developing a serious illness. -of course, we advised to return or notify a doctor immediately if symptoms worsen or persist or new concerns arise.    Patient Instructions  BEFORE YOU LEAVE: -rapid and strep culture  -We placed a referral for you as discussed to the Ear, nose and throat specialist. It usually takes about 1 week to process and schedule this referral. If you have not heard from us regarding this appointment in 1 week please contact our office.  -Can use salt water gargles, throat lozenges, tylenol or ibuprofen per instructions as needed in the interim for symptoms. Zyrtec nightly.  I hope you are feeling better soon! Seek care in the interim if worsening, new concerns or you are not improving with treatment.     Kriste BasqueKIM, HANNAH R., DO

## 2016-08-21 DIAGNOSIS — H43813 Vitreous degeneration, bilateral: Secondary | ICD-10-CM | POA: Diagnosis not present

## 2016-08-21 DIAGNOSIS — H43393 Other vitreous opacities, bilateral: Secondary | ICD-10-CM | POA: Diagnosis not present

## 2016-08-21 DIAGNOSIS — H2513 Age-related nuclear cataract, bilateral: Secondary | ICD-10-CM | POA: Diagnosis not present

## 2016-08-21 DIAGNOSIS — H4423 Degenerative myopia, bilateral: Secondary | ICD-10-CM | POA: Diagnosis not present

## 2016-08-21 LAB — CULTURE, GROUP A STREP

## 2016-08-30 DIAGNOSIS — H903 Sensorineural hearing loss, bilateral: Secondary | ICD-10-CM | POA: Diagnosis not present

## 2016-08-30 DIAGNOSIS — J039 Acute tonsillitis, unspecified: Secondary | ICD-10-CM | POA: Diagnosis not present

## 2016-09-17 DIAGNOSIS — L814 Other melanin hyperpigmentation: Secondary | ICD-10-CM | POA: Diagnosis not present

## 2016-09-17 DIAGNOSIS — L821 Other seborrheic keratosis: Secondary | ICD-10-CM | POA: Diagnosis not present

## 2016-09-17 DIAGNOSIS — D225 Melanocytic nevi of trunk: Secondary | ICD-10-CM | POA: Diagnosis not present

## 2016-09-17 DIAGNOSIS — D1800 Hemangioma unspecified site: Secondary | ICD-10-CM | POA: Diagnosis not present

## 2016-09-17 DIAGNOSIS — Z23 Encounter for immunization: Secondary | ICD-10-CM | POA: Diagnosis not present

## 2016-10-11 ENCOUNTER — Telehealth: Payer: Self-pay

## 2016-10-11 NOTE — Telephone Encounter (Signed)
Flu vaccine given at [facility], documented and sent to scan in.

## 2016-10-16 ENCOUNTER — Telehealth: Payer: Self-pay | Admitting: Family Medicine

## 2016-10-16 NOTE — Telephone Encounter (Signed)
Pt states she is still having some ear issues and wants to be referred to someone else for a second opinion. Dr Selena Batten referred to Dr Ezzard Standing the first time

## 2016-10-16 NOTE — Telephone Encounter (Signed)
I left a detailed message with the information below at the pts home number. 

## 2016-10-16 NOTE — Telephone Encounter (Signed)
She could call [facility]? Does not need referral. appt here if she wishes.

## 2016-10-24 ENCOUNTER — Other Ambulatory Visit: Payer: Self-pay | Admitting: Family Medicine

## 2016-10-31 DIAGNOSIS — H903 Sensorineural hearing loss, bilateral: Secondary | ICD-10-CM | POA: Diagnosis not present

## 2016-12-14 DIAGNOSIS — M238X1 Other internal derangements of right knee: Secondary | ICD-10-CM | POA: Diagnosis not present

## 2016-12-25 ENCOUNTER — Other Ambulatory Visit: Payer: Self-pay | Admitting: Obstetrics & Gynecology

## 2016-12-25 DIAGNOSIS — Z139 Encounter for screening, unspecified: Secondary | ICD-10-CM

## 2016-12-27 DIAGNOSIS — H9312 Tinnitus, left ear: Secondary | ICD-10-CM | POA: Diagnosis not present

## 2016-12-27 DIAGNOSIS — H903 Sensorineural hearing loss, bilateral: Secondary | ICD-10-CM | POA: Diagnosis not present

## 2017-01-02 ENCOUNTER — Other Ambulatory Visit: Payer: Self-pay | Admitting: Otolaryngology

## 2017-01-02 DIAGNOSIS — H903 Sensorineural hearing loss, bilateral: Secondary | ICD-10-CM

## 2017-01-02 DIAGNOSIS — H9312 Tinnitus, left ear: Secondary | ICD-10-CM

## 2017-01-09 ENCOUNTER — Ambulatory Visit
Admission: RE | Admit: 2017-01-09 | Discharge: 2017-01-09 | Disposition: A | Discharge status: Home / Self Care | Payer: BLUE CROSS/BLUE SHIELD | Source: Ambulatory Visit | Attending: Otolaryngology | Admitting: Otolaryngology

## 2017-01-09 DIAGNOSIS — H9312 Tinnitus, left ear: Secondary | ICD-10-CM | POA: Diagnosis not present

## 2017-01-09 DIAGNOSIS — H903 Sensorineural hearing loss, bilateral: Secondary | ICD-10-CM | POA: Diagnosis not present

## 2017-01-09 MED ORDER — GADOBENATE DIMEGLUMINE 529 MG/ML IV SOLN
18.0000 mL | Freq: Once | INTRAVENOUS | Status: AC | PRN
  Administered 2017-01-09: 18 mL via INTRAVENOUS

## 2017-01-18 DIAGNOSIS — H9313 Tinnitus, bilateral: Secondary | ICD-10-CM | POA: Diagnosis not present

## 2017-01-18 DIAGNOSIS — H9113 Presbycusis, bilateral: Secondary | ICD-10-CM | POA: Diagnosis not present

## 2017-01-21 DIAGNOSIS — Z6827 Body mass index (BMI) 27.0-27.9, adult: Secondary | ICD-10-CM | POA: Diagnosis not present

## 2017-01-21 DIAGNOSIS — Z01419 Encounter for gynecological examination (general) (routine) without abnormal findings: Secondary | ICD-10-CM | POA: Diagnosis not present

## 2017-02-04 ENCOUNTER — Encounter: Payer: Self-pay | Admitting: Family Medicine

## 2017-02-15 ENCOUNTER — Ambulatory Visit
Admission: RE | Admit: 2017-02-15 | Discharge: 2017-02-15 | Disposition: A | Discharge status: Home / Self Care | Payer: BLUE CROSS/BLUE SHIELD | Source: Ambulatory Visit | Attending: Obstetrics & Gynecology | Admitting: Obstetrics & Gynecology

## 2017-02-15 DIAGNOSIS — Z1231 Encounter for screening mammogram for malignant neoplasm of breast: Secondary | ICD-10-CM | POA: Diagnosis not present

## 2017-02-15 DIAGNOSIS — Z139 Encounter for screening, unspecified: Secondary | ICD-10-CM

## 2017-02-15 LAB — HM MAMMOGRAPHY

## 2017-04-22 DIAGNOSIS — L820 Inflamed seborrheic keratosis: Secondary | ICD-10-CM | POA: Diagnosis not present

## 2017-05-22 DIAGNOSIS — H8102 Meniere's disease, left ear: Secondary | ICD-10-CM | POA: Diagnosis not present

## 2017-05-22 DIAGNOSIS — H903 Sensorineural hearing loss, bilateral: Secondary | ICD-10-CM | POA: Diagnosis not present

## 2017-05-22 DIAGNOSIS — H9312 Tinnitus, left ear: Secondary | ICD-10-CM | POA: Diagnosis not present

## 2017-06-10 DIAGNOSIS — M25512 Pain in left shoulder: Secondary | ICD-10-CM | POA: Diagnosis not present

## 2017-06-10 DIAGNOSIS — M19012 Primary osteoarthritis, left shoulder: Secondary | ICD-10-CM | POA: Diagnosis not present

## 2017-06-26 ENCOUNTER — Other Ambulatory Visit: Payer: Self-pay | Admitting: Family Medicine

## 2017-06-27 NOTE — Telephone Encounter (Signed)
Patient has CPE scheduled for July.

## 2017-07-03 DIAGNOSIS — H8102 Meniere's disease, left ear: Secondary | ICD-10-CM | POA: Diagnosis not present

## 2017-07-03 DIAGNOSIS — H903 Sensorineural hearing loss, bilateral: Secondary | ICD-10-CM | POA: Diagnosis not present

## 2017-07-03 DIAGNOSIS — H9312 Tinnitus, left ear: Secondary | ICD-10-CM | POA: Diagnosis not present

## 2017-07-16 ENCOUNTER — Encounter: Payer: Self-pay | Admitting: Family Medicine

## 2017-07-25 ENCOUNTER — Encounter: Payer: BLUE CROSS/BLUE SHIELD | Admitting: Family Medicine

## 2017-07-28 NOTE — Progress Notes (Signed)
HPI:  Using dictation device. Unfortunately this device frequently misinterprets words/phrases.  Here for CPE: Due for labs.  -Concerns and/or follow up today:  New concern of BRBPR: -noticed small amount on TP and on stool several times a few weeks ago -does not recall pain, abd discomfort, diarrhea, constipation or any other symptoms and no symptoms since -normal colonoscopy per her report in 204   Chronic medical problems summarized below were reviewed for changes.  HLD/Obesity: -weight watchers and exercising in 2018 helped with wt reduction; continues wt watchers and regular exercise -meds:simvastatin -wt 196 (7/18) --> 187 (7/19)  Sees gyn, saw counselor for adjustment d/o and also does skin checks with dermatologist.  -Diet: variety of foods, balance and well rounded, larger portion sizes -Exercise: no regular exercise -Taking folic acid, vitamin D or calcium:taking vit D -Diabetes and Dyslipidemia Screening: fasting for labs -Vaccines: see vaccine section EPIC -pap history: 01/2016 [facility] Ob -FDLMP: see nursing notes -sexual activity: yes, female partner, no new partners -wants STI testing (Hep C if born [birth year]): no -FH breast, colon or ovarian ca: see FH Last mammogram: 02/2017 Last colon cancer screening: 03/2012 Breast Ca Risk Assessment: see family history and pt history DEXA (>/= 65): n/a  -Alcohol, Tobacco, drug use: see social history  Review of Systems - no fevers, unintentional weight loss, vision loss, hearing loss, chest pain, sob, hemoptysis, hematuria, genital discharge, changing or concerning skin lesions, bleeding, bruising, loc, thoughts of self harm or SI  Past Medical History:  Diagnosis Date  . Abdominal migraine, not intractable 05/30/2015  . Allergy    SEASONAL  . Anemia   . GERD (gastroesophageal reflux disease)   . H/O seasonal allergies   . Heart murmur    AS A CHILD  . Hyperlipidemia   . Migraine headache   . Tennis elbow     Past  Surgical History:  Procedure Laterality Date  . BREAST BIOPSY Right 1994   benign  . BREAST CYST EXCISION Right    20 years ago  . COLONOSCOPY      Family History  Problem Relation Age of Onset  . Breast cancer Mother   . Osteoarthritis Mother 49  . Rheum arthritis Mother 50       psoriatic arthritis, osteoarthritis  . Lymphoma Mother   . Leukemia Father 68       deceased  . Osteoarthritis Sister   . Colon polyps Sister   . Heart disease Paternal Grandmother   . Breast cancer Maternal Grandmother   . Breast cancer Maternal Aunt        x 2    Social History   Socioeconomic History  . Marital status: Married    Spouse name: vernon  . Number of children: 0  . Years of education: grad schoo  . Highest education level: Not on file  Occupational History  . Occupation: IT trainer   Social Needs  . Financial resource strain: Not on file  . Food insecurity:    Worry: Not on file    Inability: Not on file  . Transportation needs:    Medical: Not on file    Non-medical: Not on file  Tobacco Use  . Smoking status: Former Smoker    Years: 10.00    Types: Cigarettes    Last attempt to quit: 01/09/1995    Years since quitting: 22.5  . Smokeless tobacco: Never Used  Substance and Sexual Activity  . Alcohol use: Yes    Alcohol/week: 3.0 oz  Types: 5 Cans of beer per week  . Drug use: No  . Sexual activity: Not on file  Lifestyle  . Physical activity:    Days per week: Not on file    Minutes per session: Not on file  . Stress: Not on file  Relationships  . Social connections:    Talks on phone: Not on file    Gets together: Not on file    Attends religious service: Not on file    Active member of club or organization: Not on file    Attends meetings of clubs or organizations: Not on file    Relationship status: Not on file  Other Topics Concern  . Not on file  Social History Narrative   Work or School: none      Home Situation: lives with husband      Spiritual  Beliefs: catholic      Lifestyle: regular exercise- healthy diet but has sweet tooth        Current Outpatient Medications:  .  Cetirizine HCl (ZYRTEC PO), Take 10 mg by mouth daily. , Disp: , Rfl:  .  diphenhydrAMINE HCl (BENADRYL PO), Take by mouth at bedtime., Disp: , Rfl:  .  simvastatin (ZOCOR) 40 MG tablet, TAKE 1 TABLET BY MOUTH AT BEDTIME, Disp: 90 tablet, Rfl: 2  EXAM:  Vitals:   07/29/17 1031  BP: 102/82  Pulse: (!) 57  Temp: 98.6 F (37 C)    GENERAL: vitals reviewed and listed below, alert, oriented, appears well hydrated and in no acute distress  HEENT: head atraumatic, PERRLA, normal appearance of eyes, ears, nose and mouth. moist mucus membranes.  NECK: supple, no masses or lymphadenopathy  LUNGS: clear to auscultation bilaterally, no rales, rhonchi or wheeze  CV: HRRR, no peripheral edema or cyanosis, normal pedal pulses  ABDOMEN: bowel sounds normal, soft, non tender to palpation, no masses, no rebound or guarding  GU/BREAST: declined, does with gyn  DRE: small soft hemorrhoid, no tears, no nodules, no blood on exam glove, hemocult card neg  SKIN: no rash or abnormal lesions  MS: normal gait, moves all extremities normally  NEURO: normal gait, speech and thought processing grossly intact, muscle tone grossly intact throughout  PSYCH: normal affect, pleasant and cooperative  ASSESSMENT AND PLAN:  Discussed the following assessment and plan:  PREVENTIVE EXAM: -Discussed and advised all [place] preventive services health task force level A and B recommendations for age, sex and risks. -Advised at least 150 minutes of exercise per week and a healthy diet  -labs, studies and vaccines per orders this encounter - Lipid panel - Hemoglobin A1c  2. Screening for depression -see epic  3. Rectal bleeding -we discussed possible serious and likely etiologies, workup and treatment, treatment risks and return precautions, exam suggest small hemorrhoid was  cause -after this discussion, Heather Chen opted for: - POC Hemoccult Bld/Stl (3-Cd Home Screen); Future -follow up advised if any further symptoms and pending testing -of course, we advised Heather Chen  to return or notify a doctor immediately if symptoms worsen or persist or new concerns arise.    Patient advised to return to clinic immediately if symptoms worsen or persist or new concerns.  There are no Patient Instructions on file for this visit.  No follow-ups on file.  Terressa KoyanagiHannah R Kim, DO

## 2017-07-29 ENCOUNTER — Encounter: Payer: Self-pay | Admitting: Family Medicine

## 2017-07-29 ENCOUNTER — Ambulatory Visit (INDEPENDENT_AMBULATORY_CARE_PROVIDER_SITE_OTHER): Payer: BLUE CROSS/BLUE SHIELD | Admitting: Family Medicine

## 2017-07-29 VITALS — BP 102/82 | HR 57 | Temp 98.6°F | Ht 67.75 in | Wt 187.7 lb

## 2017-07-29 DIAGNOSIS — Z1331 Encounter for screening for depression: Secondary | ICD-10-CM

## 2017-07-29 DIAGNOSIS — Z0000 Encounter for general adult medical examination without abnormal findings: Secondary | ICD-10-CM | POA: Diagnosis not present

## 2017-07-29 DIAGNOSIS — K625 Hemorrhage of anus and rectum: Secondary | ICD-10-CM | POA: Diagnosis not present

## 2017-07-29 LAB — LIPID PANEL
CHOL/HDL RATIO: 3
Cholesterol: 174 mg/dL (ref 0–200)
HDL: 54.7 mg/dL (ref >39.00)
LDL Cholesterol: 100 mg/dL — ABNORMAL HIGH (ref 0–99)
NONHDL: 118.93
TRIGLYCERIDES: 95 mg/dL (ref 0.0–149.0)
VLDL: 19 mg/dL (ref 0.0–40.0)

## 2017-07-29 LAB — HEMOGLOBIN A1C: Hgb A1c MFr Bld: 5.6 % (ref 4.6–6.5)

## 2017-07-29 NOTE — Patient Instructions (Addendum)
BEFORE YOU LEAVE: -labs -follow up:    Complete the stool cards.  Take fiber supplement.  Follow up promptly if any further symptoms.  We have ordered labs or studies at this visit. It can take up to 1-2 weeks for results and processing. IF results require follow up or explanation, we will call you with instructions. Clinically stable results will be released to your [facility]. If you have not heard from us or cannot find your results in [facility] in 2 weeks please contact our office at [phone].  If you are not yet signed up for [facility], please consider signing up.   We recommend the following healthy lifestyle for LIFE: 1) Small portions. But, make sure to get regular (at least 3 per day), healthy meals and small healthy snacks if needed.  2) Eat a healthy clean diet.   TRY TO EAT: -at least 5-7 servings of low sugar, colorful, and nutrient rich vegetables per day (not corn, potatoes or bananas.) -berries are the best choice if you wish to eat fruit (only eat small amounts if trying to reduce weight)  -lean meets (fish, white meat of chicken or [place]key) -vegan proteins for some meals - beans or tofu, whole grains, nuts and seeds -Replace bad fats with good fats - good fats include: fish, nuts and seeds, canola oil, olive oil -small amounts of low fat or non fat dairy -small amounts of100 % whole grains - check the lables -drink plenty of water  AVOID: -SUGAR, sweets, anything with added sugar, corn syrup or sweeteners - must read labels as even foods advertised as "healthy" often are loaded with sugar -if you must have a sweetener, small amounts of stevia may be best -sweetened beverages and artificially sweetened beverages -simple starches (rice, bread, potatoes, pasta, chips, etc - small amounts of 100% whole grains are ok) -red meat, pork, butter -fried foods, fast food, processed food, excessive dairy, eggs and coconut.  3)Get at least 150 minutes of sweaty aerobic exercise  per week.  4)Reduce stress - consider counseling, meditation and relaxation to balance other aspects of your life.

## 2017-07-30 ENCOUNTER — Encounter: Payer: Self-pay | Admitting: *Deleted

## 2017-08-09 LAB — POC HEMOCCULT BLD/STL (HOME/3-CARD/SCREEN)
Card #2 Fecal Occult Blod, POC: NEGATIVE
FECAL OCCULT BLD: NEGATIVE
FECAL OCCULT BLD: NEGATIVE

## 2017-08-09 NOTE — Addendum Note (Signed)
Addended by: Bonnye FavaKWEI, NANA K on: 08/09/2017 11:44 AM   Modules accepted: Orders

## 2017-09-17 DIAGNOSIS — D1800 Hemangioma unspecified site: Secondary | ICD-10-CM | POA: Diagnosis not present

## 2017-09-17 DIAGNOSIS — L259 Unspecified contact dermatitis, unspecified cause: Secondary | ICD-10-CM | POA: Diagnosis not present

## 2017-09-17 DIAGNOSIS — L821 Other seborrheic keratosis: Secondary | ICD-10-CM | POA: Diagnosis not present

## 2017-09-17 DIAGNOSIS — D225 Melanocytic nevi of trunk: Secondary | ICD-10-CM | POA: Diagnosis not present

## 2017-09-17 DIAGNOSIS — L814 Other melanin hyperpigmentation: Secondary | ICD-10-CM | POA: Diagnosis not present

## 2017-10-23 DIAGNOSIS — Z23 Encounter for immunization: Secondary | ICD-10-CM | POA: Diagnosis not present

## 2017-12-19 ENCOUNTER — Other Ambulatory Visit: Payer: Self-pay | Admitting: Obstetrics & Gynecology

## 2017-12-19 DIAGNOSIS — Z1231 Encounter for screening mammogram for malignant neoplasm of breast: Secondary | ICD-10-CM

## 2018-01-29 DIAGNOSIS — Z124 Encounter for screening for malignant neoplasm of cervix: Secondary | ICD-10-CM | POA: Diagnosis not present

## 2018-01-29 DIAGNOSIS — Z6828 Body mass index (BMI) 28.0-28.9, adult: Secondary | ICD-10-CM | POA: Diagnosis not present

## 2018-01-29 DIAGNOSIS — Z01419 Encounter for gynecological examination (general) (routine) without abnormal findings: Secondary | ICD-10-CM | POA: Diagnosis not present

## 2018-02-17 ENCOUNTER — Ambulatory Visit
Admission: RE | Admit: 2018-02-17 | Discharge: 2018-02-17 | Disposition: A | Discharge status: Home / Self Care | Payer: BLUE CROSS/BLUE SHIELD | Source: Ambulatory Visit | Attending: Obstetrics & Gynecology | Admitting: Obstetrics & Gynecology

## 2018-02-17 DIAGNOSIS — Z1231 Encounter for screening mammogram for malignant neoplasm of breast: Secondary | ICD-10-CM

## 2018-02-17 LAB — HM MAMMOGRAPHY

## 2018-02-20 ENCOUNTER — Encounter: Payer: Self-pay | Admitting: Family Medicine

## 2018-03-04 ENCOUNTER — Other Ambulatory Visit: Payer: Self-pay | Admitting: Family Medicine

## 2018-03-25 DIAGNOSIS — H9312 Tinnitus, left ear: Secondary | ICD-10-CM | POA: Diagnosis not present

## 2018-03-25 DIAGNOSIS — H8102 Meniere's disease, left ear: Secondary | ICD-10-CM | POA: Diagnosis not present

## 2018-03-25 DIAGNOSIS — H903 Sensorineural hearing loss, bilateral: Secondary | ICD-10-CM | POA: Diagnosis not present

## 2018-04-21 ENCOUNTER — Telehealth: Payer: Self-pay

## 2018-04-21 NOTE — Telephone Encounter (Signed)
Copied from CRM [id number]. Topic: General - Other >> Apr 16, 2018  1:57 PM Jaquita Rector A wrote: Reason for CRM: Patient called back would like to reschedule her appointment with Dr Hassan Rowan. Please contact patient at Ph# [phone]

## 2018-04-21 NOTE — Telephone Encounter (Signed)
.  appointment rescheduled

## 2018-08-05 ENCOUNTER — Encounter: Payer: BLUE CROSS/BLUE SHIELD | Admitting: Family Medicine

## 2018-08-13 ENCOUNTER — Other Ambulatory Visit: Payer: Self-pay | Admitting: Family Medicine

## 2018-08-27 DIAGNOSIS — H43812 Vitreous degeneration, left eye: Secondary | ICD-10-CM | POA: Diagnosis not present

## 2018-08-27 DIAGNOSIS — H4423 Degenerative myopia, bilateral: Secondary | ICD-10-CM | POA: Diagnosis not present

## 2018-08-27 DIAGNOSIS — H43393 Other vitreous opacities, bilateral: Secondary | ICD-10-CM | POA: Diagnosis not present

## 2018-08-27 DIAGNOSIS — H43821 Vitreomacular adhesion, right eye: Secondary | ICD-10-CM | POA: Diagnosis not present

## 2018-09-22 ENCOUNTER — Other Ambulatory Visit: Payer: Self-pay

## 2018-09-22 ENCOUNTER — Ambulatory Visit (INDEPENDENT_AMBULATORY_CARE_PROVIDER_SITE_OTHER): Payer: BC Managed Care – PPO | Admitting: Family Medicine

## 2018-09-22 ENCOUNTER — Encounter: Payer: Self-pay | Admitting: Family Medicine

## 2018-09-22 VITALS — BP 100/72 | HR 78 | Temp 97.2°F | Ht 67.75 in | Wt 189.4 lb

## 2018-09-22 DIAGNOSIS — Z0000 Encounter for general adult medical examination without abnormal findings: Secondary | ICD-10-CM

## 2018-09-22 DIAGNOSIS — L2084 Intrinsic (allergic) eczema: Secondary | ICD-10-CM | POA: Diagnosis not present

## 2018-09-22 DIAGNOSIS — J309 Allergic rhinitis, unspecified: Secondary | ICD-10-CM

## 2018-09-22 DIAGNOSIS — Z862 Personal history of diseases of the blood and blood-forming organs and certain disorders involving the immune mechanism: Secondary | ICD-10-CM

## 2018-09-22 DIAGNOSIS — E785 Hyperlipidemia, unspecified: Secondary | ICD-10-CM | POA: Diagnosis not present

## 2018-09-22 DIAGNOSIS — D225 Melanocytic nevi of trunk: Secondary | ICD-10-CM | POA: Diagnosis not present

## 2018-09-22 DIAGNOSIS — Z23 Encounter for immunization: Secondary | ICD-10-CM

## 2018-09-22 DIAGNOSIS — Z131 Encounter for screening for diabetes mellitus: Secondary | ICD-10-CM

## 2018-09-22 DIAGNOSIS — L814 Other melanin hyperpigmentation: Secondary | ICD-10-CM | POA: Diagnosis not present

## 2018-09-22 DIAGNOSIS — L821 Other seborrheic keratosis: Secondary | ICD-10-CM | POA: Diagnosis not present

## 2018-09-22 NOTE — Patient Instructions (Addendum)
It was nice to meet you today. Below is some information about the shingles vaccine as well as shingles rash. You can let us know if you would like to complete this vaccination at a later date.   Zoster Vaccine, Recombinant injection What is this medicine? ZOSTER VACCINE (ZOS ter vak SEEN) is used to prevent shingles in adults 57 years old and over. This vaccine is not used to treat shingles or nerve pain from shingles. This medicine may be used for other purposes; ask your health care provider or pharmacist if you have questions. COMMON BRAND NAME(S): [facility] What should I tell my health care provider before I take this medicine? They need to know if you have any of these conditions:  blood disorders or disease  cancer like leukemia or lymphoma  immune system problems or therapy  an unusual or allergic reaction to vaccines, other medications, foods, dyes, or preservatives  pregnant or trying to get pregnant  breast-feeding How should I use this medicine? This vaccine is for injection in a muscle. It is given by a health care professional. Talk to your pediatrician regarding the use of this medicine in children. This medicine is not approved for use in children. Overdosage: If you think you have taken too much of this medicine contact a poison control center or emergency room at once. NOTE: This medicine is only for you. Do not share this medicine with others. What if I miss a dose? Keep appointments for follow-up (booster) doses as directed. It is important not to miss your dose. Call your doctor or health care professional if you are unable to keep an appointment. What may interact with this medicine?  medicines that suppress your immune system  medicines to treat cancer  steroid medicines like prednisone or cortisone This list may not describe all possible interactions. Give your health care provider a list of all the medicines, herbs, non-prescription drugs, or dietary  supplements you use. Also tell them if you smoke, drink alcohol, or use illegal drugs. Some items may interact with your medicine. What should I watch for while using this medicine? Visit your doctor for regular check ups. This vaccine, like all vaccines, may not fully protect everyone. What side effects may I notice from receiving this medicine? Side effects that you should report to your doctor or health care professional as soon as possible:  allergic reactions like skin rash, itching or hives, swelling of the face, lips, or tongue  breathing problems Side effects that usually do not require medical attention (report these to your doctor or health care professional if they continue or are bothersome):  chills  headache  fever  nausea, vomiting  redness, warmth, pain, swelling or itching at site where injected  tiredness This list may not describe all possible side effects. Call your doctor for medical advice about side effects. You may report side effects to FDA at 1-800-FDA-1088. Where should I keep my medicine? This vaccine is only given in a clinic, pharmacy, doctor's office, or other health care setting and will not be stored at home. NOTE: This sheet is a summary. It may not cover all possible information. If you have questions about this medicine, talk to your doctor, pharmacist, or health care provider.  2020 Elsevier/Gold Standard (2016-08-06 13:20:30)  Shingles  Shingles, which is also known as herpes zoster, is an infection that causes a painful skin rash and fluid-filled blisters. It is caused by a virus. Shingles only develops in people who:  Have had  chickenpox.  Have been given a medicine to protect against chickenpox (have been vaccinated). Shingles is rare in this group. What are the causes? Shingles is caused by varicella-zoster virus (VZV). This is the same virus that causes chickenpox. After a person is exposed to VZV, the virus stays in the body in an  inactive (dormant) state. Shingles develops if the virus is reactivated. This can happen many years after the first (initial) exposure to VZV. It is not known what causes this virus to be reactivated. What increases the risk? People who have had chickenpox or received the chickenpox vaccine are at risk for shingles. Shingles infection is more common in people who:  Are older than age 45.  Have a weakened disease-fighting system (immune system), such as people with: ? HIV. ? AIDS. ? Cancer.  Are taking medicines that weaken the immune system, such as transplant medicines.  Are experiencing a lot of stress. What are the signs or symptoms? Early symptoms of this condition include itching, tingling, and pain in an area on your skin. Pain may be described as burning, stabbing, or throbbing. A few days or weeks after early symptoms start, a painful red rash appears. The rash is usually on one side of the body and has a band-like or belt-like pattern. The rash eventually turns into fluid-filled blisters that break open, change into scabs, and dry up in about 2-3 weeks. At any time during the infection, you may also develop:  A fever.  Chills.  A headache.  An upset stomach. How is this diagnosed? This condition is diagnosed with a skin exam. Skin or fluid samples may be taken from the blisters before a diagnosis is made. These samples are examined under a microscope or sent to a lab for testing. How is this treated? The rash may last for several weeks. There is not a specific cure for this condition. Your health care provider will probably prescribe medicines to help you manage pain, recover more quickly, and avoid long-term problems. Medicines may include:  Antiviral drugs.  Anti-inflammatory drugs.  Pain medicines.  Anti-itching medicines (antihistamines). If the area involved is on your face, you may be referred to a specialist, such as an eye doctor (ophthalmologist) or an ear,  nose, and throat (ENT) doctor (otolaryngologist) to help you avoid eye problems, chronic pain, or disability. Follow these instructions at home: Medicines  Take over-the-counter and prescription medicines only as told by your health care provider.  Apply an anti-itch cream or numbing cream to the affected area as told by your health care provider. Relieving itching and discomfort   Apply cold, wet cloths (cold compresses) to the area of the rash or blisters as told by your health care provider.  Cool baths can be soothing. Try adding baking soda or dry oatmeal to the water to reduce itching. Do not bathe in hot water. Blister and rash care  Keep your rash covered with a loose bandage (dressing). Wear loose-fitting clothing to help ease the pain of material rubbing against the rash.  Keep your rash and blisters clean by washing the area with mild soap and cool water as told by your health care provider.  Check your rash every day for signs of infection. Check for: ? More redness, swelling, or pain. ? Fluid or blood. ? Warmth. ? Pus or a bad smell.  Do not scratch your rash or pick at your blisters. To help avoid scratching: ? Keep your fingernails clean and cut short. ? Wear gloves  or mittens while you sleep, if scratching is a problem. General instructions  Rest as told by your health care provider.  Keep all follow-up visits as told by your health care provider. This is important.  Wash your hands often with soap and water. If soap and water are not available, use hand sanitizer. Doing this lowers your chance of getting a bacterial skin infection.  Before your blisters change into scabs, your shingles infection can cause chickenpox in people who have never had it or have never been vaccinated against it. To prevent this from happening, avoid contact with other people, especially: ? Babies. ? Pregnant women. ? Children who have eczema. ? Elderly people who have  transplants. ? People who have chronic illnesses, such as cancer or AIDS. Contact a health care provider if:  Your pain is not relieved with prescribed medicines.  Your pain does not get better after the rash heals.  You have signs of infection in the rash area, such as: ? More redness, swelling, or pain around the rash. ? Fluid or blood coming from the rash. ? The rash area feeling warm to the touch. ? Pus or a bad smell coming from the rash. Get help right away if:  The rash is on your face or nose.  You have facial pain, pain around your eye area, or loss of feeling on one side of your face.  You have difficulty seeing.  You have ear pain or have ringing in your ear.  You have a loss of taste.  Your condition gets worse. Summary  Shingles, which is also known as herpes zoster, is an infection that causes a painful skin rash and fluid-filled blisters.  This condition is diagnosed with a skin exam. Skin or fluid samples may be taken from the blisters and examined before the diagnosis is made.  Keep your rash covered with a loose bandage (dressing). Wear loose-fitting clothing to help ease the pain of material rubbing against the rash.  Before your blisters change into scabs, your shingles infection can cause chickenpox in people who have never had it or have never been vaccinated against it. This information is not intended to replace advice given to you by your health care provider. Make sure you discuss any questions you have with your health care provider. Document Released: 12/25/2004 Document Revised: 04/18/2018 Document Reviewed: 08/29/2016 Elsevier Patient Education  2020 [employer]

## 2018-09-22 NOTE — Progress Notes (Signed)
Heather Chen DOB: 1960-02-20 Encounter date: 09/22/2018  This is a 58 y.o. female who presents for complete physical and transfer of care  History of present illness/Additional concerns: No specific worries or concerns today.   HL: simavastatin, weight watchers stopped in January. Just working on healthy habits/healthy eating. Exercising regularly. Getting a lot of cardio. Going to restart with trainer outside starting tomorrow. Does this 1-2 times/week. Walks a lot, works outside.   Allergies: zyrtec 10mg  daily. Spring and fall are rough for her; then eczema flares as well. Constant head congestion. Takes benadryl at night.   Anxiety/depression due to adjustment difficulty - watched mom deteriorate with dementia, lymphoma. Middle of 5 children; family drama. Has been awhile since she has seen counselor, but can call when needed. This occurred in 2016.   At last physical (last year) with [facility] complained of BRBPR with wiping and then completed fecal occult cards - these were negative.   Gets weird sensations left ear; some hearing loss. Has been on the chlorthalidone due to this. Follows regularly with Dr. Thornell Mule. Thinks that it has helped, but still gets some closing off of sound.   Follows with gynecology: [facility] Follows with dermatology: Dr. Renda Rolls. Has appointment today for mole check.  Counselor: not seeing regularly; just as needed.  Colonoscopy 03/2012: repeat in 10 years Last mammogram was 02/2018   Past Medical History:  Diagnosis Date  . Abdominal migraine, not intractable 05/30/2015  . Allergy    SEASONAL  . Anemia   . GERD (gastroesophageal reflux disease)   . H/O seasonal allergies   . Heart murmur    AS A CHILD  . Hyperlipidemia   . Migraine headache   . Tennis elbow    Past Surgical History:  Procedure Laterality Date  . BREAST BIOPSY Right 1994   benign  . BREAST CYST EXCISION Right    20 years ago  . COLONOSCOPY     Allergies   Allergen Reactions  . Neomycin     rash   Current Meds  Medication Sig  . Cetirizine HCl (ZYRTEC PO) Take 10 mg by mouth daily.   . chlorthalidone (HYGROTON) 50 MG tablet Take by mouth daily.  . diphenhydrAMINE HCl (BENADRYL PO) Take by mouth at bedtime.  . simvastatin (ZOCOR) 40 MG tablet TAKE 1 TABLET BY MOUTH EVERYDAY AT BEDTIME   Social History   Tobacco Use  . Smoking status: Former Smoker    Years: 10.00    Types: Cigarettes    Quit date: 01/09/1995    Years since quitting: 23.7  . Smokeless tobacco: Never Used  Substance Use Topics  . Alcohol use: Yes    Alcohol/week: 5.0 standard drinks    Types: 5 Cans of beer per week   Family History  Problem Relation Age of Onset  . Breast cancer Mother 56  . Osteoarthritis Mother 78  . Rheum arthritis Mother 31       psoriatic arthritis, osteoarthritis  . Lymphoma Mother   . Dementia Mother   . Hearing loss Mother   . Leukemia Father 6       deceased  . Arthritis Father   . Colon polyps Sister   . Rheum arthritis Sister   . Heart disease Paternal Grandmother   . Breast cancer Maternal Grandmother   . Breast cancer Maternal Aunt        x 2  . Alcohol abuse Brother   . Rheum arthritis Sister  Review of Systems  Constitutional: Negative for activity change, appetite change, chills, fatigue, fever and unexpected weight change.  HENT: Negative for congestion, ear pain, hearing loss, sinus pressure, sinus pain, sore throat and trouble swallowing.   Eyes: Negative for pain and visual disturbance.  Respiratory: Negative for cough, chest tightness, shortness of breath and wheezing.   Cardiovascular: Negative for chest pain, palpitations and leg swelling.  Gastrointestinal: Negative for abdominal pain, blood in stool, constipation, diarrhea, nausea and vomiting.  Genitourinary: Negative for difficulty urinating and menstrual problem.  Musculoskeletal: Negative for arthralgias and back pain.  Skin: Negative for rash.   Neurological: Negative for dizziness, weakness, numbness and headaches.  Hematological: Negative for adenopathy. Does not bruise/bleed easily.  Psychiatric/Behavioral: Negative for sleep disturbance and suicidal ideas. The patient is not nervous/anxious.     CBC:  Lab Results  Component Value Date   WBC 7.6 06/22/2013   HGB 13.7 06/22/2013   HCT 40.8 06/22/2013   MCHC 33.6 06/22/2013   RDW 14.1 06/22/2013   PLT 269.0 06/22/2013   CMP: Lab Results  Component Value Date   NA 139 06/22/2013   K 4.5 06/22/2013   CL 105 06/22/2013   CO2 27 06/22/2013   GLUCOSE 98 06/22/2013   BUN 14 06/22/2013   CREATININE 1.0 06/22/2013   CALCIUM 9.3 06/22/2013   PROT 6.9 06/22/2013   BILITOT 0.4 06/22/2013   ALKPHOS 52 06/22/2013   ALT 28 06/22/2013   AST 29 06/22/2013   LIPID: Lab Results  Component Value Date   CHOL 174 07/29/2017   TRIG 95.0 07/29/2017   HDL 54.70 07/29/2017   LDLCALC 100 (H) 07/29/2017    Objective:  BP 100/72 (BP Location: Left Arm, Patient Position: Sitting, Cuff Size: Large)   Pulse 78   Temp (!) 97.2 F (36.2 C) (Temporal)   Ht 5' 7.75" (1.721 m)   Wt 189 lb 6.4 oz (85.9 kg)   LMP 09/09/2014   SpO2 98%   BMI 29.01 kg/m   Weight: 189 lb 6.4 oz (85.9 kg)   BP Readings from Last 3 Encounters:  09/22/18 100/72  07/29/17 102/82  08/20/16 118/80   Wt Readings from Last 3 Encounters:  09/22/18 189 lb 6.4 oz (85.9 kg)  07/29/17 187 lb 11.2 oz (85.1 kg)  08/20/16 197 lb 6.4 oz (89.5 kg)    Physical Exam Constitutional:      General: She is not in acute distress.    Appearance: She is well-developed.  HENT:     Head: Normocephalic and atraumatic.     Right Ear: External ear normal.     Left Ear: External ear normal.     Ears:     Comments: Left TM dull; white appearance    Mouth/Throat:     Pharynx: No oropharyngeal exudate.  Eyes:     Conjunctiva/sclera: Conjunctivae normal.     Pupils: Pupils are equal, round, and reactive to light.   Neck:     Musculoskeletal: Normal range of motion and neck supple.     Thyroid: No thyromegaly.  Cardiovascular:     Rate and Rhythm: Normal rate and regular rhythm.     Heart sounds: Normal heart sounds. No murmur. No friction rub. No gallop.   Pulmonary:     Effort: Pulmonary effort is normal.     Breath sounds: Normal breath sounds.  Abdominal:     General: Bowel sounds are normal. There is no distension.     Palpations: Abdomen is soft. There is  no mass.     Tenderness: There is abdominal tenderness. There is no guarding.     Hernia: No hernia is present.     Comments: Right mid abdominal tenderness to palpation. No guarding.  Musculoskeletal: Normal range of motion.        General: No tenderness or deformity.  Lymphadenopathy:     Cervical: No cervical adenopathy.  Skin:    General: Skin is warm and dry.     Findings: No rash.  Neurological:     Mental Status: She is alert and oriented to person, place, and time.     Deep Tendon Reflexes: Reflexes normal.     Reflex Scores:      Tricep reflexes are 2+ on the right side and 2+ on the left side.      Bicep reflexes are 2+ on the right side and 2+ on the left side.      Brachioradialis reflexes are 2+ on the right side and 2+ on the left side.      Patellar reflexes are 2+ on the right side and 2+ on the left side. Psychiatric:        Speech: Speech normal.        Behavior: Behavior normal.        Thought Content: Thought content normal.     Assessment/Plan: Health Maintenance Due  Topic Date Due  . INFLUENZA VACCINE  08/09/2018   Health Maintenance reviewed.  1. Preventative health care Keep up with healthy eating and regular exercise.  2. Hyperlipidemia, unspecified hyperlipidemia type Diet controlled. - Comprehensive metabolic panel; Future - Lipid panel; Future - TSH; Future  3. Allergic rhinitis, unspecified seasonality, unspecified trigger Continue with antihistamines as directed by derm.  4. History  of anemia - CBC with Differential/Platelet; Future  5. Need for influenza vaccination Completed today  6. Screening for diabetes mellitus - Hemoglobin A1c; Future  Return in about 1 year (around 09/22/2019) for physical exam.  Theodis Shove, MD

## 2018-09-22 NOTE — Addendum Note (Signed)
Addended by: Agnes Lawrence on: 09/22/2018 11:54 AM   Modules accepted: Orders

## 2018-09-23 ENCOUNTER — Telehealth: Payer: Self-pay | Admitting: *Deleted

## 2018-09-23 NOTE — Telephone Encounter (Signed)
Copied from [place] [id number]. Topic: General - Other >> Sep 23, 2018  9:30 AM Leward Quan A wrote: Reason for CRM: Patient called to say that during her visit on 07/22/2018 Dr Ethlyn Gallery told her about the Shingles vaccine she called to say that she would like to get this vaccine. Asking if she can get it when she come in for her labs on 09/25/2018 patient would like a call back at Ph# [phone]

## 2018-09-24 NOTE — Telephone Encounter (Signed)
Fine with me if you can schedule nurse visit for that day

## 2018-09-25 ENCOUNTER — Other Ambulatory Visit (INDEPENDENT_AMBULATORY_CARE_PROVIDER_SITE_OTHER): Payer: BC Managed Care – PPO

## 2018-09-25 ENCOUNTER — Other Ambulatory Visit: Payer: Self-pay

## 2018-09-25 ENCOUNTER — Telehealth: Payer: Self-pay | Admitting: *Deleted

## 2018-09-25 ENCOUNTER — Telehealth (INDEPENDENT_AMBULATORY_CARE_PROVIDER_SITE_OTHER): Payer: BC Managed Care – PPO | Admitting: Family Medicine

## 2018-09-25 DIAGNOSIS — H93A2 Pulsatile tinnitus, left ear: Secondary | ICD-10-CM | POA: Diagnosis not present

## 2018-09-25 DIAGNOSIS — T502X5A Adverse effect of carbonic-anhydrase inhibitors, benzothiadiazides and other diuretics, initial encounter: Secondary | ICD-10-CM | POA: Diagnosis not present

## 2018-09-25 DIAGNOSIS — E876 Hypokalemia: Secondary | ICD-10-CM | POA: Diagnosis not present

## 2018-09-25 DIAGNOSIS — H903 Sensorineural hearing loss, bilateral: Secondary | ICD-10-CM | POA: Diagnosis not present

## 2018-09-25 DIAGNOSIS — Z862 Personal history of diseases of the blood and blood-forming organs and certain disorders involving the immune mechanism: Secondary | ICD-10-CM | POA: Diagnosis not present

## 2018-09-25 DIAGNOSIS — Z131 Encounter for screening for diabetes mellitus: Secondary | ICD-10-CM | POA: Diagnosis not present

## 2018-09-25 DIAGNOSIS — E785 Hyperlipidemia, unspecified: Secondary | ICD-10-CM | POA: Diagnosis not present

## 2018-09-25 LAB — COMPREHENSIVE METABOLIC PANEL
ALT: 21 U/L (ref 0–35)
AST: 24 U/L (ref 0–37)
Albumin: 4.6 g/dL (ref 3.5–5.2)
Alkaline Phosphatase: 75 U/L (ref 39–117)
BUN: 13 mg/dL (ref 6–23)
CO2: 35 mEq/L — ABNORMAL HIGH (ref 19–32)
Calcium: 9.7 mg/dL (ref 8.4–10.5)
Chloride: 97 mEq/L (ref 96–112)
Creatinine, Ser: 0.9 mg/dL (ref 0.40–1.20)
GFR: 64.49 mL/min (ref >60.00)
Glucose, Bld: 82 mg/dL (ref 70–99)
Potassium: 2.7 mEq/L — CL (ref 3.5–5.1)
Sodium: 141 mEq/L (ref 135–145)
Total Bilirubin: 0.5 mg/dL (ref 0.2–1.2)
Total Protein: 6.9 g/dL (ref 6.0–8.3)

## 2018-09-25 LAB — LDL CHOLESTEROL, DIRECT: Direct LDL: 118 mg/dL

## 2018-09-25 LAB — LIPID PANEL
Cholesterol: 197 mg/dL (ref 0–200)
HDL: 50.2 mg/dL (ref >39.00)
NonHDL: 146.64
Total CHOL/HDL Ratio: 4
Triglycerides: 209 mg/dL — ABNORMAL HIGH (ref 0.0–149.0)
VLDL: 41.8 mg/dL — ABNORMAL HIGH (ref 0.0–40.0)

## 2018-09-25 LAB — CBC WITH DIFFERENTIAL/PLATELET
Basophils Absolute: 0 10*3/uL (ref 0.0–0.1)
Basophils Relative: 0.8 % (ref 0.0–3.0)
Eosinophils Absolute: 0.3 10*3/uL (ref 0.0–0.7)
Eosinophils Relative: 4.3 % (ref 0.0–5.0)
HCT: 39.8 % (ref 36.0–46.0)
Hemoglobin: 13.5 g/dL (ref 12.0–15.0)
Lymphocytes Relative: 45.6 % (ref 12.0–46.0)
Lymphs Abs: 2.8 10*3/uL (ref 0.7–4.0)
MCHC: 33.9 g/dL (ref 30.0–36.0)
MCV: 87.9 fl (ref 78.0–100.0)
Monocytes Absolute: 0.4 10*3/uL (ref 0.1–1.0)
Monocytes Relative: 7.1 % (ref 3.0–12.0)
Neutro Abs: 2.6 10*3/uL (ref 1.4–7.7)
Neutrophils Relative %: 42.2 % — ABNORMAL LOW (ref 43.0–77.0)
Platelets: 253 10*3/uL (ref 150.0–400.0)
RBC: 4.53 Mil/uL (ref 3.87–5.11)
RDW: 13.5 % (ref 11.5–15.5)
WBC: 6.1 10*3/uL (ref 4.0–10.5)

## 2018-09-25 LAB — TSH: TSH: 1.53 u[IU]/mL (ref 0.35–4.50)

## 2018-09-25 LAB — HEMOGLOBIN A1C: Hgb A1c MFr Bld: 5.9 % (ref 4.6–6.5)

## 2018-09-25 MED ORDER — POTASSIUM CHLORIDE CRYS ER 20 MEQ PO TBCR: 20.0000 meq | EXTENDED_RELEASE_TABLET | Freq: Two times a day (BID) | ORAL | 0 refills | Status: DC

## 2018-09-25 MED ORDER — POTASSIUM CHLORIDE CRYS ER 20 MEQ PO TBCR: 20.0000 meq | EXTENDED_RELEASE_TABLET | Freq: Every day | ORAL | 0 refills | Status: DC

## 2018-09-25 NOTE — Progress Notes (Signed)
Virtual Visit via Telephone Note  I connected with Heather Chen on 09/25/18 at 12:40 PM EDT by telephone and verified that I am speaking with the correct person using two identifiers.   I discussed the limitations, risks, security and privacy concerns of performing an evaluation and management service by telephone and the availability of in person appointments. I also discussed with the patient that there may be a patient responsible charge related to this service. The patient expressed understanding and agreed to proceed.  Location patient: home Location provider: work or home office Participants present for the call: patient, provider Patient did not have a visit in the prior 7 days to address this/these issue(s).   History of Present Illness:  Acute visit for abnormal lab results. She had very low potassium on recent labs with PCP. PCP is not in the office today. She feels ok. No leg cramps, CP, SOB, palpitations. Rarely during pandemic has racing heart when lying in bed at night that she felt was related to stress. Has been working out with trainer recently and felt fine. She is on a new medication from her ENT doc - Chlorthalidone. She has been on this since March. This if for fluid in the ear. She did not take the medication yet today, but had been taking it. Potassium was 2.7 on labs this morning.   Observations/Objective: Patient sounds cheerful and well on the phone. I do not appreciate any SOB. Speech and thought processing are grossly intact. Patient reported vitals:  Assessment and Plan:  Hypokalemia Diuretic use   -we discussed possible serious and likely etiologies and potential complications of hypokalemia, workup and treatment, treatment risks and return precautions. Suspect this is from the Chlorthalidone. She agrees to stop that medication and is seeing her ENT doctor today. Will send a few days of potassium supplement to the pharmacy and advised prompt  recheck next week. Sent orders and message to scheduling to assist. Advised prompt evaluation if any symptoms arise in the interim. If ENT feels risks > benefits for continued diuretic therapy she would likely need continuous potassium supplementation and close monitoring of potassium levels. -of course, we advised Heather Chen  to return or notify a doctor immediately if any symptoms arise or if she has any concerns. -will copy note to PCP  Follow Up Instructions:   I did not refer this patient for an OV in the next 24 hours for this/these issue(s).  I discussed the assessment and treatment plan with the patient. The patient was provided an opportunity to ask questions and all were answered. The patient agreed with the plan and demonstrated an understanding of the instructions.   The patient was advised to call back or seek an in-person evaluation if the symptoms worsen or if the condition fails to improve as anticipated.  I provided 12:39 minutes of non-face-to-face time during this encounter.   Heather Kern, DO   Patient Instructions   -STOP the Chlorthalidone (diuretic) and speak with your specialist about the low potassium  -I sent the medication(s) we discussed to your pharmacy: Meds ordered this encounter  Medications  . potassium chloride SA (K-DUR) 20 MEQ tablet    Sig: Take 1 tablet (20 mEq total) by mouth once daily    Dispense:  5 tablet    Refill:  0    Please make a lab visit for next week to recheck your potassium.   Please let us know if you have any questions or concerns regarding  this prescription.  I hope you are feeling better soon! Seek care promptly if your symptoms worsen, new concerns arise or you are not improving with treatment.

## 2018-09-25 NOTE — Telephone Encounter (Signed)
Can she do telephone or video visit with me now to go over results and see how she feels?

## 2018-09-25 NOTE — Patient Instructions (Addendum)
-  STOP the Chlorthalidone (diuretic) and speak with your specialist about the low potassium  -I sent the medication(s) we discussed to your pharmacy: Meds ordered this encounter  Medications  . potassium chloride SA (K-DUR) 20 MEQ tablet    Sig: Take 1 tablet (20 mEq total) by mouth once daily    Dispense:  5 tablet    Refill:  0    Please make a lab visit for next week to recheck your potassium.   Please let us know if you have any questions or concerns regarding this prescription.  I hope you are feeling better soon! Seek care promptly if your symptoms worsen, new concerns arise or you are not improving with treatment.

## 2018-09-25 NOTE — Telephone Encounter (Signed)
Pt was called and scheduled with Dr. Maudie Mercury today

## 2018-09-25 NOTE — Telephone Encounter (Signed)
Tammy scheduled pt for a nurse visit on 9/21.

## 2018-09-25 NOTE — Telephone Encounter (Signed)
Clinic RN received a call from [facility] at [facility]. Patient Potassium level is at 2.7. Please advise

## 2018-09-29 ENCOUNTER — Other Ambulatory Visit: Payer: Self-pay

## 2018-09-29 ENCOUNTER — Ambulatory Visit (INDEPENDENT_AMBULATORY_CARE_PROVIDER_SITE_OTHER): Payer: BC Managed Care – PPO

## 2018-09-29 DIAGNOSIS — Z23 Encounter for immunization: Secondary | ICD-10-CM

## 2018-09-29 NOTE — Progress Notes (Signed)
Per orders of Dr. Ethlyn Gallery, injection of Shingrix given by Wyvonne Lenz. Patient tolerated injection well.

## 2018-10-02 ENCOUNTER — Other Ambulatory Visit (INDEPENDENT_AMBULATORY_CARE_PROVIDER_SITE_OTHER): Payer: BC Managed Care – PPO

## 2018-10-02 ENCOUNTER — Encounter: Payer: Self-pay | Admitting: *Deleted

## 2018-10-02 ENCOUNTER — Other Ambulatory Visit: Payer: Self-pay

## 2018-10-02 DIAGNOSIS — E876 Hypokalemia: Secondary | ICD-10-CM

## 2018-10-02 LAB — BASIC METABOLIC PANEL
BUN: 14 mg/dL (ref 6–23)
CO2: 31 mEq/L (ref 19–32)
Calcium: 9.3 mg/dL (ref 8.4–10.5)
Chloride: 104 mEq/L (ref 96–112)
Creatinine, Ser: 0.86 mg/dL (ref 0.40–1.20)
GFR: 67.96 mL/min (ref >60.00)
Glucose, Bld: 84 mg/dL (ref 70–99)
Potassium: 4.3 mEq/L (ref 3.5–5.1)
Sodium: 142 mEq/L (ref 135–145)

## 2018-10-02 MED ORDER — CHLORTHALIDONE 50 MG PO TABS: 50.0000 mg | ORAL_TABLET | Freq: Every day | ORAL | 3 refills | Status: DC

## 2018-10-02 MED ORDER — POTASSIUM CHLORIDE CRYS ER 20 MEQ PO TBCR: 20.0000 meq | EXTENDED_RELEASE_TABLET | Freq: Every day | ORAL | 3 refills | Status: DC

## 2018-10-02 NOTE — Progress Notes (Signed)
-  Patient did labs today- 10/02/18//tes

## 2018-10-02 NOTE — Addendum Note (Signed)
Addended by: Agnes Lawrence on: 10/02/2018 03:40 PM   Modules accepted: Orders

## 2018-10-21 ENCOUNTER — Other Ambulatory Visit: Payer: Self-pay

## 2018-10-21 ENCOUNTER — Other Ambulatory Visit (INDEPENDENT_AMBULATORY_CARE_PROVIDER_SITE_OTHER): Payer: BC Managed Care – PPO

## 2018-10-21 ENCOUNTER — Other Ambulatory Visit: Payer: BC Managed Care – PPO

## 2018-10-21 DIAGNOSIS — E876 Hypokalemia: Secondary | ICD-10-CM

## 2018-10-21 LAB — BASIC METABOLIC PANEL
BUN: 20 mg/dL (ref 6–23)
CO2: 34 mEq/L — ABNORMAL HIGH (ref 19–32)
Calcium: 10 mg/dL (ref 8.4–10.5)
Chloride: 98 mEq/L (ref 96–112)
Creatinine, Ser: 0.93 mg/dL (ref 0.40–1.20)
GFR: 62.08 mL/min (ref >60.00)
Glucose, Bld: 91 mg/dL (ref 70–99)
Potassium: 3.3 mEq/L — ABNORMAL LOW (ref 3.5–5.1)
Sodium: 141 mEq/L (ref 135–145)

## 2018-10-22 ENCOUNTER — Telehealth: Payer: Self-pay | Admitting: *Deleted

## 2018-10-22 NOTE — Telephone Encounter (Signed)
Pt given lab message as ordered. Repeat BMP scheduled per note. Need an order for lab.

## 2018-10-24 ENCOUNTER — Other Ambulatory Visit: Payer: Self-pay

## 2018-10-24 DIAGNOSIS — E876 Hypokalemia: Secondary | ICD-10-CM

## 2018-10-24 NOTE — Telephone Encounter (Signed)
Order has been placed. Nothing further needed. 

## 2018-11-05 ENCOUNTER — Other Ambulatory Visit: Payer: Self-pay | Admitting: Family Medicine

## 2018-11-07 ENCOUNTER — Other Ambulatory Visit (INDEPENDENT_AMBULATORY_CARE_PROVIDER_SITE_OTHER): Payer: BC Managed Care – PPO

## 2018-11-07 ENCOUNTER — Telehealth: Payer: Self-pay | Admitting: Family Medicine

## 2018-11-07 ENCOUNTER — Other Ambulatory Visit: Payer: Self-pay

## 2018-11-07 DIAGNOSIS — E876 Hypokalemia: Secondary | ICD-10-CM

## 2018-11-07 LAB — BASIC METABOLIC PANEL
BUN: 16 mg/dL (ref 6–23)
CO2: 30 mEq/L (ref 19–32)
Calcium: 9.1 mg/dL (ref 8.4–10.5)
Chloride: 106 mEq/L (ref 96–112)
Creatinine, Ser: 0.86 mg/dL (ref 0.40–1.20)
GFR: 67.93 mL/min (ref >60.00)
Glucose, Bld: 91 mg/dL (ref 70–99)
Potassium: 4 mEq/L (ref 3.5–5.1)
Sodium: 142 mEq/L (ref 135–145)

## 2018-11-07 NOTE — Telephone Encounter (Signed)
Pt would like to have lab results called in to her mobile number [phone] due to her not having power and not sure when it will be coming back on.

## 2018-11-07 NOTE — Telephone Encounter (Signed)
Results are not available yet, but may be back before the end of the day.  Please make note to call her with these.

## 2018-11-10 NOTE — Telephone Encounter (Signed)
See results note. 

## 2018-12-08 ENCOUNTER — Other Ambulatory Visit: Payer: Self-pay | Admitting: Obstetrics & Gynecology

## 2018-12-08 DIAGNOSIS — Z1231 Encounter for screening mammogram for malignant neoplasm of breast: Secondary | ICD-10-CM

## 2018-12-11 ENCOUNTER — Other Ambulatory Visit: Payer: Self-pay

## 2018-12-12 ENCOUNTER — Ambulatory Visit (INDEPENDENT_AMBULATORY_CARE_PROVIDER_SITE_OTHER): Payer: BC Managed Care – PPO

## 2018-12-12 DIAGNOSIS — Z23 Encounter for immunization: Secondary | ICD-10-CM | POA: Diagnosis not present

## 2018-12-12 NOTE — Progress Notes (Signed)
After obtaining consent, and per orders of Dr. Koberlein, injection of Shingrix given by Sheena H Cox. Patient instructed to remain in clinic for 20 minutes afterwards, and to report any adverse reaction to me immediately.  

## 2018-12-17 ENCOUNTER — Ambulatory Visit: Payer: BC Managed Care – PPO

## 2018-12-25 ENCOUNTER — Other Ambulatory Visit: Payer: Self-pay | Admitting: Family Medicine

## 2018-12-26 ENCOUNTER — Other Ambulatory Visit: Payer: Self-pay | Admitting: Family Medicine

## 2019-01-09 HISTORY — PX: MENISCUS DEBRIDEMENT: SHX5178

## 2019-02-03 DIAGNOSIS — Z6829 Body mass index (BMI) 29.0-29.9, adult: Secondary | ICD-10-CM | POA: Diagnosis not present

## 2019-02-03 DIAGNOSIS — Z01419 Encounter for gynecological examination (general) (routine) without abnormal findings: Secondary | ICD-10-CM | POA: Diagnosis not present

## 2019-02-12 DIAGNOSIS — H8102 Meniere's disease, left ear: Secondary | ICD-10-CM | POA: Diagnosis not present

## 2019-02-12 DIAGNOSIS — G43109 Migraine with aura, not intractable, without status migrainosus: Secondary | ICD-10-CM | POA: Diagnosis not present

## 2019-02-12 DIAGNOSIS — H903 Sensorineural hearing loss, bilateral: Secondary | ICD-10-CM | POA: Diagnosis not present

## 2019-02-12 DIAGNOSIS — H9312 Tinnitus, left ear: Secondary | ICD-10-CM | POA: Diagnosis not present

## 2019-02-17 ENCOUNTER — Other Ambulatory Visit: Payer: Self-pay

## 2019-02-17 ENCOUNTER — Encounter: Payer: Self-pay | Admitting: Family Medicine

## 2019-02-17 ENCOUNTER — Telehealth (INDEPENDENT_AMBULATORY_CARE_PROVIDER_SITE_OTHER): Payer: BC Managed Care – PPO | Admitting: Family Medicine

## 2019-02-17 VITALS — BP 126/72 | HR 78 | Ht 69.0 in | Wt 197.0 lb

## 2019-02-17 DIAGNOSIS — E876 Hypokalemia: Secondary | ICD-10-CM

## 2019-02-17 DIAGNOSIS — H8109 Meniere's disease, unspecified ear: Secondary | ICD-10-CM

## 2019-02-17 DIAGNOSIS — E669 Obesity, unspecified: Secondary | ICD-10-CM | POA: Diagnosis not present

## 2019-02-17 NOTE — Progress Notes (Signed)
Virtual Visit via Video Note  I connected with Heather Chen  on 02/17/19 at 10:00 AM EST by a video enabled telemedicine application and verified that I am speaking with the correct person using two identifiers.  Location patient: home Location provider:work or home office Persons participating in the virtual visit: patient, provider  I discussed the limitations of evaluation and management by telemedicine and the availability of in person appointments. The patient expressed understanding and agreed to proceed.   HPI:  Acute visit for a few issues:  Hypokalemia/Meniere's: -taking diuretic on and off from ENT for ear issues and this causes hypokalemia -saw ENT recently as was having worsening hearing issues and tinnitus - this is a lot better on the diuretic, diagnosed with Meniere's -she will be getting hearing aides -she wonders if she can go completely deaf, she will be asking her ENT specialist -She now is starting hctz-triam (started 2 days ago - 37.5/25) and wants to make sure potassium is ok on this  Obesity/HLD: -working out with a trainer -trying to eat healthy -reports BP has always been good, lower end, has BP cuff -she has had some reduction in weight -continues her statin  Preventive: -did pelvic with Dr. Rosealee Albee, a week ago -scheduled for mammo -wants to do tetanus booster when comes for labs  ROS: See pertinent positives and negatives per HPI.  Past Medical History:  Diagnosis Date  . Abdominal migraine, not intractable 05/30/2015  . Allergy    SEASONAL  . Anemia   . GERD (gastroesophageal reflux disease)   . H/O seasonal allergies   . Heart murmur    AS A CHILD  . Hyperlipidemia   . Migraine headache   . Tennis elbow     Past Surgical History:  Procedure Laterality Date  . BREAST BIOPSY Right 1994   benign  . BREAST CYST EXCISION Right    20 years ago  . COLONOSCOPY      Family History  Problem Relation Age of Onset  . Breast cancer Mother 94  .  Osteoarthritis Mother 20  . Rheum arthritis Mother 50       psoriatic arthritis, osteoarthritis  . Lymphoma Mother   . Dementia Mother   . Hearing loss Mother   . Leukemia Father 57       deceased  . Arthritis Father   . Colon polyps Sister   . Rheum arthritis Sister   . Heart disease Paternal Grandmother   . Breast cancer Maternal Grandmother   . Breast cancer Maternal Aunt        x 2  . Alcohol abuse Brother   . Rheum arthritis Sister     SOCIAL HX: see hpi  Current Outpatient Medications:  .  Cetirizine HCl (ZYRTEC PO), Take 10 mg by mouth daily. , Disp: , Rfl:  .  diphenhydrAMINE HCl (BENADRYL PO), Take by mouth at bedtime., Disp: , Rfl:  .  simvastatin (ZOCOR) 40 MG tablet, TAKE 1 TABLET BY MOUTH EVERYDAY AT BEDTIME, Disp: 90 tablet, Rfl: 1 .  triamterene-hydrochlorothiazide (DYAZIDE) 37.5-25 MG capsule, Take by mouth daily. , Disp: , Rfl:   EXAM:  VITALS per patient if applicable: Vitals:   02/17/19 0913  BP: 126/72  Pulse: 78   Body mass index is 29.09 kg/m.   GENERAL: alert, oriented, appears well and in no acute distress  HEENT: atraumatic, conjunttiva clear, no obvious abnormalities on inspection of external nose and ears  NECK: normal movements of the head and neck  LUNGS:  on inspection no signs of respiratory distress, breathing rate appears normal, no obvious gross SOB, gasping or wheezing  CV: no obvious cyanosis  MS: moves all visible extremities without noticeable abnormality  PSYCH/NEURO: pleasant and cooperative, no obvious depression or anxiety, speech and thought processing grossly intact  ASSESSMENT AND PLAN:  Discussed the following assessment and plan:  Meniere's disease, unspecified laterality  Hypokalemia  Obesity without serious comorbidity, unspecified classification, unspecified obesity type  -we discussed possible serious and likely etiologies, options for evaluation and workup, limitations of telemedicine visit vs in person  visit, treatment, treatment risks and precautions.  Will check potassium in a few weeks in regards to the new medication, though hoping with triamterene-hctz combo hypokalemia not as much of an issues. If still present would start potassium possibly if ENT feels this medication is needed. Discussed diet options with lower salt, avoidance of nicotine and caffiene. Also discussed possible healthy, whole foods based lower carb diet for overall health and continued wt reduction. Congratulated on progress. Reviewed healthy maintenance. She does pap with gyn, updated. mammo is scheduled. She wants to get tetanus booster when comes for labs. Sent message to schedulers to assist. Advised pt to call if our office does not contact her in the next 3-4 business days about appointments. Follow up in 3-4 months with PCP or via telemedicine. Patient agrees to seek care if needed in interim.   The patient was advised to call back or seek an in-person evaluation if the symptoms worsen or if the condition fails to improve as anticipated.   Lucretia Kern, DO

## 2019-02-19 ENCOUNTER — Other Ambulatory Visit: Payer: Self-pay

## 2019-02-19 ENCOUNTER — Ambulatory Visit
Admission: RE | Admit: 2019-02-19 | Discharge: 2019-02-19 | Disposition: A | Discharge status: Home / Self Care | Payer: BC Managed Care – PPO | Source: Ambulatory Visit | Attending: Obstetrics & Gynecology | Admitting: Obstetrics & Gynecology

## 2019-02-19 DIAGNOSIS — Z1231 Encounter for screening mammogram for malignant neoplasm of breast: Secondary | ICD-10-CM | POA: Diagnosis not present

## 2019-02-20 ENCOUNTER — Other Ambulatory Visit: Payer: Self-pay | Admitting: Family Medicine

## 2019-02-20 DIAGNOSIS — Z23 Encounter for immunization: Secondary | ICD-10-CM

## 2019-02-20 DIAGNOSIS — E876 Hypokalemia: Secondary | ICD-10-CM

## 2019-02-24 ENCOUNTER — Other Ambulatory Visit: Payer: Self-pay

## 2019-02-25 ENCOUNTER — Encounter (INDEPENDENT_AMBULATORY_CARE_PROVIDER_SITE_OTHER): Payer: Self-pay

## 2019-02-25 ENCOUNTER — Ambulatory Visit (INDEPENDENT_AMBULATORY_CARE_PROVIDER_SITE_OTHER): Payer: BC Managed Care – PPO | Admitting: *Deleted

## 2019-02-25 DIAGNOSIS — Z23 Encounter for immunization: Secondary | ICD-10-CM

## 2019-02-25 NOTE — Progress Notes (Signed)
Per orders of Dr. Hassan Rowan, injection of Tdap given by Kern Reap. Patient tolerated injection well.

## 2019-03-09 ENCOUNTER — Other Ambulatory Visit (INDEPENDENT_AMBULATORY_CARE_PROVIDER_SITE_OTHER): Payer: BC Managed Care – PPO

## 2019-03-09 ENCOUNTER — Other Ambulatory Visit: Payer: Self-pay

## 2019-03-09 DIAGNOSIS — E876 Hypokalemia: Secondary | ICD-10-CM

## 2019-03-09 LAB — BASIC METABOLIC PANEL
BUN: 14 mg/dL (ref 6–23)
CO2: 34 mEq/L — ABNORMAL HIGH (ref 19–32)
Calcium: 9.6 mg/dL (ref 8.4–10.5)
Chloride: 97 mEq/L (ref 96–112)
Creatinine, Ser: 0.93 mg/dL (ref 0.40–1.20)
GFR: 61.99 mL/min (ref >60.00)
Glucose, Bld: 79 mg/dL (ref 70–99)
Potassium: 4.1 mEq/L (ref 3.5–5.1)
Sodium: 137 mEq/L (ref 135–145)

## 2019-03-27 ENCOUNTER — Telehealth: Payer: Self-pay | Admitting: Family Medicine

## 2019-03-27 NOTE — Telephone Encounter (Signed)
Patient informed of the message below.

## 2019-03-27 NOTE — Telephone Encounter (Signed)
Pt received the Tetnaus inj on 2/17. She is schedule for the COVID vaccine tomorrow. Pt wants to make sure she is OK to get it with inj being a month apart?  Pt can be reached at [phone] -ok to leave a detailed message

## 2019-03-27 NOTE — Telephone Encounter (Signed)
Yes that is fine

## 2019-04-09 ENCOUNTER — Telehealth (INDEPENDENT_AMBULATORY_CARE_PROVIDER_SITE_OTHER): Payer: BC Managed Care – PPO | Admitting: Family Medicine

## 2019-04-09 DIAGNOSIS — I959 Hypotension, unspecified: Secondary | ICD-10-CM

## 2019-04-09 DIAGNOSIS — R300 Dysuria: Secondary | ICD-10-CM | POA: Diagnosis not present

## 2019-04-09 DIAGNOSIS — H8109 Meniere's disease, unspecified ear: Secondary | ICD-10-CM

## 2019-04-09 MED ORDER — NITROFURANTOIN MONOHYD MACRO 100 MG PO CAPS: 100.0000 mg | ORAL_CAPSULE | Freq: Two times a day (BID) | ORAL | 0 refills | Status: DC

## 2019-04-09 NOTE — Progress Notes (Signed)
Virtual Visit via Video Note  I connected with Heather Chen   on 04/09/19 at  1:20 PM EDT by a video enabled telemedicine application and verified that I am speaking with the correct person using two identifiers.  Location patient: home, [place] Location provider:work or home office Persons participating in the virtual visit: patient, provider. husband  I discussed the limitations of evaluation and management by telemedicine and the availability of in person appointments. The patient expressed understanding and agreed to proceed.   HPI:  Acute visit for Dysuria: -started yesterday -mild burning with urination -denies fevers, NVD, vaginal symptoms, abd or pelvic pain, hematuria, flank pain -hx of rare UTI in the past  Questions about BP: -checks BP every morning, BP used to run 100/70s -now sometimes is 80s/60s -she feels fine -takes triam/hctz for Menire's -wonder if this is ok -denies dizziness, syncope, symptoms with this   ROS: See pertinent positives and negatives per HPI.  Past Medical History:  Diagnosis Date  . Abdominal migraine, not intractable 05/30/2015  . Allergy    SEASONAL  . Anemia   . GERD (gastroesophageal reflux disease)   . H/O seasonal allergies   . Heart murmur    AS A CHILD  . Hyperlipidemia   . Migraine headache   . Tennis elbow     Past Surgical History:  Procedure Laterality Date  . BREAST BIOPSY Right 1994   benign  . BREAST CYST EXCISION Right    20 years ago  . COLONOSCOPY      Family History  Problem Relation Age of Onset  . Breast cancer Mother 67  . Osteoarthritis Mother 10  . Rheum arthritis Mother 74       psoriatic arthritis, osteoarthritis  . Lymphoma Mother   . Dementia Mother   . Hearing loss Mother   . Leukemia Father 73       deceased  . Arthritis Father   . Colon polyps Sister   . Rheum arthritis Sister   . Heart disease Paternal Grandmother   . Breast cancer Maternal Grandmother   . Breast cancer Maternal Aunt      x 2  . Alcohol abuse Brother   . Rheum arthritis Sister     SOCIAL HX: see hpi   Current Outpatient Medications:  .  Cetirizine HCl (ZYRTEC PO), Take 10 mg by mouth daily. , Disp: , Rfl:  .  diphenhydrAMINE HCl (BENADRYL PO), Take by mouth at bedtime., Disp: , Rfl:  .  nitrofurantoin, macrocrystal-monohydrate, (MACROBID) 100 MG capsule, Take 1 capsule (100 mg total) by mouth 2 (two) times daily., Disp: 14 capsule, Rfl: 0 .  simvastatin (ZOCOR) 40 MG tablet, TAKE 1 TABLET BY MOUTH EVERYDAY AT BEDTIME, Disp: 90 tablet, Rfl: 1 .  triamterene-hydrochlorothiazide (DYAZIDE) 37.5-25 MG capsule, Take by mouth daily. , Disp: , Rfl:   EXAM:  VITALS per patient if applicable:  GENERAL: alert, oriented, appears well and in no acute distress  HEENT: atraumatic, conjunttiva clear, no obvious abnormalities on inspection of external nose and ears  NECK: normal movements of the head and neck  LUNGS: on inspection no signs of respiratory distress, breathing rate appears normal, no obvious gross SOB, gasping or wheezing  CV: no obvious cyanosis  MS: moves all visible extremities without noticeable abnormality  PSYCH/NEURO: pleasant and cooperative, no obvious depression or anxiety, speech and thought processing grossly intact  ASSESSMENT AND PLAN:  Discussed the following assessment and plan:  Dysuria  Hypotension, unspecified hypotension type  Meniere's disease,  unspecified laterality  -we discussed possible serious and likely etiologies, options for evaluation and workup, limitations of telemedicine visit vs in person visit, treatment, treatment risks and precautions.  Opted for empiric treatment for possible UTI with Macrobid 100mg  bid x 7 days. Patient agrees to seek prompt in person care if worsening, new symptoms arise, or if is not improving with treatment. In regards the BP, discussed normal BP. She is going to check with ENT to see if ok to skip or 1/2 dose the diuretic on days  BP is running low.    I discussed the assessment and treatment plan with the patient. The patient was provided an opportunity to ask questions and all were answered. The patient agreed with the plan and demonstrated an understanding of the instructions.   The patient was advised to call back or seek an in-person evaluation if the symptoms worsen or if the condition fails to improve as anticipated.   , DO

## 2019-04-09 NOTE — Patient Instructions (Signed)
-  I sent the medication(s) we discussed to your pharmacy: °Meds ordered this encounter  °Medications  °• nitrofurantoin, macrocrystal-monohydrate, (MACROBID) 100 MG capsule  °  Sig: Take 1 capsule (100 mg total) by mouth 2 (two) times daily.  °  Dispense:  14 capsule  °  Refill:  0  ° ° °Please let us know if you have any questions or concerns regarding this prescription. ° °I hope you are feeling better soon! °Seek care promptly if your symptoms worsen, new concerns arise or you are not improving with treatment. ° °

## 2019-04-14 DIAGNOSIS — N3091 Cystitis, unspecified with hematuria: Secondary | ICD-10-CM | POA: Diagnosis not present

## 2019-04-14 DIAGNOSIS — Z6829 Body mass index (BMI) 29.0-29.9, adult: Secondary | ICD-10-CM | POA: Diagnosis not present

## 2019-04-14 DIAGNOSIS — N760 Acute vaginitis: Secondary | ICD-10-CM | POA: Diagnosis not present

## 2019-04-14 DIAGNOSIS — R309 Painful micturition, unspecified: Secondary | ICD-10-CM | POA: Diagnosis not present

## 2019-04-20 DIAGNOSIS — A609 Anogenital herpesviral infection, unspecified: Secondary | ICD-10-CM | POA: Diagnosis not present

## 2019-04-20 DIAGNOSIS — N762 Acute vulvitis: Secondary | ICD-10-CM | POA: Diagnosis not present

## 2019-05-03 ENCOUNTER — Other Ambulatory Visit: Payer: Self-pay | Admitting: Family Medicine

## 2019-05-24 ENCOUNTER — Encounter: Payer: Self-pay | Admitting: Family Medicine

## 2019-05-26 ENCOUNTER — Other Ambulatory Visit: Payer: Self-pay | Admitting: Family Medicine

## 2019-05-26 DIAGNOSIS — R739 Hyperglycemia, unspecified: Secondary | ICD-10-CM

## 2019-05-26 DIAGNOSIS — E876 Hypokalemia: Secondary | ICD-10-CM

## 2019-05-26 DIAGNOSIS — M25569 Pain in unspecified knee: Secondary | ICD-10-CM

## 2019-05-26 DIAGNOSIS — E785 Hyperlipidemia, unspecified: Secondary | ICD-10-CM

## 2019-05-27 ENCOUNTER — Other Ambulatory Visit: Payer: Self-pay

## 2019-05-28 ENCOUNTER — Other Ambulatory Visit (INDEPENDENT_AMBULATORY_CARE_PROVIDER_SITE_OTHER): Payer: BC Managed Care – PPO

## 2019-05-28 DIAGNOSIS — E785 Hyperlipidemia, unspecified: Secondary | ICD-10-CM

## 2019-05-28 DIAGNOSIS — R739 Hyperglycemia, unspecified: Secondary | ICD-10-CM

## 2019-05-28 DIAGNOSIS — M25569 Pain in unspecified knee: Secondary | ICD-10-CM

## 2019-05-28 LAB — COMPREHENSIVE METABOLIC PANEL
ALT: 16 U/L (ref 0–35)
AST: 21 U/L (ref 0–37)
Albumin: 4.6 g/dL (ref 3.5–5.2)
Alkaline Phosphatase: 73 U/L (ref 39–117)
BUN: 14 mg/dL (ref 6–23)
CO2: 32 mEq/L (ref 19–32)
Calcium: 9.5 mg/dL (ref 8.4–10.5)
Chloride: 99 mEq/L (ref 96–112)
Creatinine, Ser: 0.92 mg/dL (ref 0.40–1.20)
GFR: 62.72 mL/min (ref >60.00)
Glucose, Bld: 89 mg/dL (ref 70–99)
Potassium: 3.5 mEq/L (ref 3.5–5.1)
Sodium: 139 mEq/L (ref 135–145)
Total Bilirubin: 0.7 mg/dL (ref 0.2–1.2)
Total Protein: 7.2 g/dL (ref 6.0–8.3)

## 2019-05-28 LAB — LIPID PANEL
Cholesterol: 177 mg/dL (ref 0–200)
HDL: 56.9 mg/dL (ref >39.00)
LDL Cholesterol: 98 mg/dL (ref 0–99)
NonHDL: 119.76
Total CHOL/HDL Ratio: 3
Triglycerides: 109 mg/dL (ref 0.0–149.0)
VLDL: 21.8 mg/dL (ref 0.0–40.0)

## 2019-05-28 LAB — CBC WITH DIFFERENTIAL/PLATELET
Basophils Absolute: 0 10*3/uL (ref 0.0–0.1)
Basophils Relative: 0.5 % (ref 0.0–3.0)
Eosinophils Absolute: 0.2 10*3/uL (ref 0.0–0.7)
Eosinophils Relative: 3.5 % (ref 0.0–5.0)
HCT: 38.7 % (ref 36.0–46.0)
Hemoglobin: 13.1 g/dL (ref 12.0–15.0)
Lymphocytes Relative: 44.4 % (ref 12.0–46.0)
Lymphs Abs: 2.6 10*3/uL (ref 0.7–4.0)
MCHC: 34 g/dL (ref 30.0–36.0)
MCV: 89.5 fl (ref 78.0–100.0)
Monocytes Absolute: 0.4 10*3/uL (ref 0.1–1.0)
Monocytes Relative: 7.5 % (ref 3.0–12.0)
Neutro Abs: 2.6 10*3/uL (ref 1.4–7.7)
Neutrophils Relative %: 44.1 % (ref 43.0–77.0)
Platelets: 261 10*3/uL (ref 150.0–400.0)
RBC: 4.32 Mil/uL (ref 3.87–5.11)
RDW: 15.6 % — ABNORMAL HIGH (ref 11.5–15.5)
WBC: 5.8 10*3/uL (ref 4.0–10.5)

## 2019-05-28 LAB — HEMOGLOBIN A1C: Hgb A1c MFr Bld: 5.7 % (ref 4.6–6.5)

## 2019-06-16 ENCOUNTER — Ambulatory Visit (INDEPENDENT_AMBULATORY_CARE_PROVIDER_SITE_OTHER): Payer: BC Managed Care – PPO | Admitting: Ophthalmology

## 2019-06-16 ENCOUNTER — Other Ambulatory Visit: Payer: Self-pay

## 2019-06-16 ENCOUNTER — Encounter (INDEPENDENT_AMBULATORY_CARE_PROVIDER_SITE_OTHER): Payer: Self-pay | Admitting: Ophthalmology

## 2019-06-16 DIAGNOSIS — H43811 Vitreous degeneration, right eye: Secondary | ICD-10-CM | POA: Diagnosis not present

## 2019-06-16 DIAGNOSIS — H2513 Age-related nuclear cataract, bilateral: Secondary | ICD-10-CM | POA: Insufficient documentation

## 2019-06-16 DIAGNOSIS — H43391 Other vitreous opacities, right eye: Secondary | ICD-10-CM | POA: Insufficient documentation

## 2019-06-16 DIAGNOSIS — H43812 Vitreous degeneration, left eye: Secondary | ICD-10-CM | POA: Insufficient documentation

## 2019-06-16 NOTE — Assessment & Plan Note (Signed)

## 2019-06-16 NOTE — Progress Notes (Signed)
06/16/2019     CHIEF COMPLAINT Patient presents for Retina Evaluation   HISTORY OF PRESENT ILLNESS: Heather Chen is a 59 y.o. female who presents to the clinic today for:   HPI    Retina Evaluation    In right eye.  This started 1 day ago.  Associated Symptoms Floaters and Flashes.  Context:  distance vision.  Treatments tried include no treatments.          Comments    Patient states around 10 AM yesterday she noticed a large black floater in her right eye. Patient states she noticed later that she had several stationary small black dots. Patient states that its almost like she has a film over her right eye. 'Possible' FOL.       Last edited by Berenice Bouton on 06/16/2019  9:04 AM. (History)      Referring physician: Wynn Banker, MD [address] [place],  [place] [address]  HISTORICAL INFORMATION:   Selected notes from the MEDICAL RECORD NUMBER    Lab Results  Component Value Date   HGBA1C 5.7 05/28/2019     CURRENT MEDICATIONS: No current outpatient medications on file. (Ophthalmic Drugs)   No current facility-administered medications for this visit. (Ophthalmic Drugs)   Current Outpatient Medications (Other)  Medication Sig  . Cetirizine HCl (ZYRTEC PO) Take 10 mg by mouth daily.   . diphenhydrAMINE HCl (BENADRYL PO) Take by mouth at bedtime.  . nitrofurantoin, macrocrystal-monohydrate, (MACROBID) 100 MG capsule Take 1 capsule (100 mg total) by mouth 2 (two) times daily. (Patient not taking: Reported on 06/16/2019)  . simvastatin (ZOCOR) 40 MG tablet TAKE 1 TABLET BY MOUTH EVERYDAY AT BEDTIME  . triamterene-hydrochlorothiazide (DYAZIDE) 37.5-25 MG capsule Take by mouth daily.   . valACYclovir (VALTREX) 500 MG tablet Take 500 mg by mouth daily.   No current facility-administered medications for this visit. (Other)      REVIEW OF SYSTEMS:    ALLERGIES Allergies  Allergen Reactions  . Neomycin     rash  . Other     Nickel  and formaldehyde causes rash    PAST MEDICAL HISTORY Past Medical History:  Diagnosis Date  . Abdominal migraine, not intractable 05/30/2015  . Allergy    SEASONAL  . Anemia   . GERD (gastroesophageal reflux disease)   . H/O seasonal allergies   . Heart murmur    AS A CHILD  . Hyperlipidemia   . Migraine headache   . Tennis elbow    Past Surgical History:  Procedure Laterality Date  . BREAST BIOPSY Right 1994   benign  . BREAST CYST EXCISION Right    20 years ago  . COLONOSCOPY      FAMILY HISTORY Family History  Problem Relation Age of Onset  . Breast cancer Mother 71  . Osteoarthritis Mother 67  . Rheum arthritis Mother 50       psoriatic arthritis, osteoarthritis  . Lymphoma Mother   . Dementia Mother   . Hearing loss Mother   . Leukemia Father 24       deceased  . Arthritis Father   . Colon polyps Sister   . Rheum arthritis Sister   . Heart disease Paternal Grandmother   . Breast cancer Maternal Grandmother   . Breast cancer Maternal Aunt        x 2  . Alcohol abuse Brother   . Rheum arthritis Sister     SOCIAL HISTORY Social History  Tobacco Use  . Smoking status: Former Smoker    Years: 10.00    Types: Cigarettes    Quit date: 01/09/1995    Years since quitting: 24.4  . Smokeless tobacco: Never Used  Substance Use Topics  . Alcohol use: Yes    Alcohol/week: 5.0 standard drinks    Types: 5 Cans of beer per week  . Drug use: No         OPHTHALMIC EXAM: Base Eye Exam    Visual Acuity (Snellen - Linear)      Right Left   Dist cc 20/25+2 20/20-2   Correction: Glasses       Tonometry (Tonopen, 9:08 AM)      Right Left   Pressure 14 15       Pupils      Pupils Dark Light Shape React APD   Right PERRL 5 4 Round Brisk None   Left PERRL 5 4 Round Brisk None       Visual Fields (Counting fingers)      Left Right    Full Full       Extraocular Movement      Right Left    Full Full       Neuro/Psych    Oriented x3: Yes    Mood/Affect: Normal       Dilation    Both eyes: 1.0% Mydriacyl, 2.5% Phenylephrine @ 9:08 AM        Slit Lamp and Fundus Exam    External Exam      Right Left   External Normal Normal       Slit Lamp Exam      Right Left   Lids/Lashes Normal Normal   Conjunctiva/Sclera White and quiet White and quiet   Cornea Clear Clear   Anterior Chamber Deep and quiet Deep and quiet   Iris Round and reactive Round and reactive   Vitreous Normal Normal          IMAGING AND PROCEDURES  Imaging and Procedures for 06/16/19  OCT, Retina - OU - Both Eyes       Right Eye Quality was good. Scan locations included subfoveal.   Left Eye Quality was good. Scan locations included subfoveal.                 ASSESSMENT/PLAN:  No problem-specific Assessment & Plan notes found for this encounter.    No diagnosis found.  1.  2.  3.  Ophthalmic Meds Ordered this visit:  No orders of the defined types were placed in this encounter.      No follow-ups on file.  There are no Patient Instructions on file for this visit.   Explained the diagnoses, plan, and follow up with the patient and they expressed understanding.  Patient expressed understanding of the importance of proper follow up care.   Clent Demark Drue Camera M.D. Diseases & Surgery of the Retina and Vitreous [facility] 06/16/19     Abbreviations: M myopia (nearsighted); A astigmatism; H hyperopia (farsighted); P presbyopia; Mrx spectacle prescription;  CTL contact lenses; OD right eye; OS left eye; OU both eyes  XT exotropia; ET esotropia; PEK punctate epithelial keratitis; PEE punctate epithelial erosions; DES dry eye syndrome; MGD meibomian gland dysfunction; ATs artificial tears; PFAT's preservative free artificial tears; [place] nuclear sclerotic cataract; PSC posterior subcapsular cataract; ERM epi-retinal membrane; PVD posterior vitreous detachment; RD retinal detachment; DM diabetes mellitus; DR  diabetic retinopathy; NPDR non-proliferative diabetic retinopathy; PDR proliferative  diabetic retinopathy; CSME clinically significant macular edema; DME diabetic macular edema; dbh dot blot hemorrhages; CWS cotton wool spot; POAG primary open angle glaucoma; C/D cup-to-disc ratio; HVF humphrey visual field; GVF goldmann visual field; OCT optical coherence tomography; IOP intraocular pressure; BRVO Branch retinal vein occlusion; CRVO central retinal vein occlusion; CRAO central retinal artery occlusion; BRAO branch retinal artery occlusion; RT retinal tear; SB scleral buckle; PPV pars plana vitrectomy; VH Vitreous hemorrhage; PRP panretinal laser photocoagulation; IVK intravitreal kenalog; VMT vitreomacular traction; MH Macular hole;  NVD neovascularization of the disc; NVE neovascularization elsewhere; AREDS age related eye disease study; ARMD age related macular degeneration; POAG primary open angle glaucoma; EBMD epithelial/anterior basement membrane dystrophy; ACIOL anterior chamber intraocular lens; IOL intraocular lens; PCIOL posterior chamber intraocular lens; Phaco/IOL phacoemulsification with intraocular lens placement; [place] photorefractive keratectomy; LASIK laser assisted in situ keratomileusis; HTN hypertension; DM diabetes mellitus; COPD chronic obstructive pulmonary disease

## 2019-06-23 DIAGNOSIS — M25561 Pain in right knee: Secondary | ICD-10-CM | POA: Diagnosis not present

## 2019-06-23 DIAGNOSIS — M7651 Patellar tendinitis, right knee: Secondary | ICD-10-CM | POA: Diagnosis not present

## 2019-07-03 DIAGNOSIS — M25561 Pain in right knee: Secondary | ICD-10-CM | POA: Diagnosis not present

## 2019-07-17 DIAGNOSIS — M25561 Pain in right knee: Secondary | ICD-10-CM | POA: Diagnosis not present

## 2019-07-28 ENCOUNTER — Ambulatory Visit (INDEPENDENT_AMBULATORY_CARE_PROVIDER_SITE_OTHER): Payer: BC Managed Care – PPO | Admitting: Ophthalmology

## 2019-07-28 ENCOUNTER — Encounter (INDEPENDENT_AMBULATORY_CARE_PROVIDER_SITE_OTHER): Payer: Self-pay | Admitting: Ophthalmology

## 2019-07-28 ENCOUNTER — Other Ambulatory Visit: Payer: Self-pay

## 2019-07-28 DIAGNOSIS — H43391 Other vitreous opacities, right eye: Secondary | ICD-10-CM | POA: Diagnosis not present

## 2019-07-28 DIAGNOSIS — H43811 Vitreous degeneration, right eye: Secondary | ICD-10-CM | POA: Diagnosis not present

## 2019-07-28 DIAGNOSIS — H43812 Vitreous degeneration, left eye: Secondary | ICD-10-CM

## 2019-07-28 NOTE — Assessment & Plan Note (Signed)
Vitreous floaters of the right eye have been diminished and there symptomatic involvement with the patient's acuity.  I do suggest observe.

## 2019-07-28 NOTE — Progress Notes (Signed)
07/28/2019     CHIEF COMPLAINT Patient presents for Retina Follow Up   HISTORY OF PRESENT ILLNESS: Heather Chen is a 59 y.o. female who presents to the clinic today for:   HPI    Retina Follow Up    Patient presents with  PVD.  In right eye.  Duration of 6 weeks.  Since onset it is stable.          Comments    6 week follow up - FP OU Patient states that she has gotten used to her large floater and the small stationary dots are gone. Patient denies change in vision.       Last edited by Berenice Bouton on 07/28/2019  9:17 AM. (History)      Referring physician: Wynn Banker, MD [address] [place],  [place] [address]  HISTORICAL INFORMATION:   Selected notes from the MEDICAL RECORD NUMBER    Lab Results  Component Value Date   HGBA1C 5.7 05/28/2019     CURRENT MEDICATIONS: No current outpatient medications on file. (Ophthalmic Drugs)   No current facility-administered medications for this visit. (Ophthalmic Drugs)   Current Outpatient Medications (Other)  Medication Sig  . Cetirizine HCl (ZYRTEC PO) Take 10 mg by mouth daily.   . diphenhydrAMINE HCl (BENADRYL PO) Take by mouth at bedtime.  . nitrofurantoin, macrocrystal-monohydrate, (MACROBID) 100 MG capsule Take 1 capsule (100 mg total) by mouth 2 (two) times daily. (Patient not taking: Reported on 06/16/2019)  . simvastatin (ZOCOR) 40 MG tablet TAKE 1 TABLET BY MOUTH EVERYDAY AT BEDTIME  . triamterene-hydrochlorothiazide (DYAZIDE) 37.5-25 MG capsule Take by mouth daily.   . valACYclovir (VALTREX) 500 MG tablet Take 500 mg by mouth daily.   No current facility-administered medications for this visit. (Other)      REVIEW OF SYSTEMS:    ALLERGIES Allergies  Allergen Reactions  . Neomycin     rash  . Other     Nickel and formaldehyde causes rash    PAST MEDICAL HISTORY Past Medical History:  Diagnosis Date  . Abdominal migraine, not intractable 05/30/2015  . Allergy     SEASONAL  . Anemia   . GERD (gastroesophageal reflux disease)   . H/O seasonal allergies   . Heart murmur    AS A CHILD  . Hyperlipidemia   . Migraine headache   . Tennis elbow    Past Surgical History:  Procedure Laterality Date  . BREAST BIOPSY Right 1994   benign  . BREAST CYST EXCISION Right    20 years ago  . COLONOSCOPY      FAMILY HISTORY Family History  Problem Relation Age of Onset  . Breast cancer Mother 52  . Osteoarthritis Mother 82  . Rheum arthritis Mother 50       psoriatic arthritis, osteoarthritis  . Lymphoma Mother   . Dementia Mother   . Hearing loss Mother   . Leukemia Father 93       deceased  . Arthritis Father   . Colon polyps Sister   . Rheum arthritis Sister   . Heart disease Paternal Grandmother   . Breast cancer Maternal Grandmother   . Breast cancer Maternal Aunt        x 2  . Alcohol abuse Brother   . Rheum arthritis Sister     SOCIAL HISTORY Social History   Tobacco Use  . Smoking status: Former Smoker    Years: 10.00    Types: Cigarettes  Quit date: 01/09/1995    Years since quitting: 24.5  . Smokeless tobacco: Never Used  Substance Use Topics  . Alcohol use: Yes    Alcohol/week: 5.0 standard drinks    Types: 5 Cans of beer per week  . Drug use: No         OPHTHALMIC EXAM:  Base Eye Exam    Visual Acuity (Snellen - Linear)      Right Left   Dist cc 20/25-1 20/20-2   Correction: Glasses       Tonometry (Tonopen, 9:21 AM)      Right Left   Pressure 10 12       Pupils      Pupils Dark Light Shape React APD   Right PERRL 5 4 Round Brisk None   Left PERRL 5 4 Round Brisk None       Visual Fields (Counting fingers)      Left Right    Full Full       Extraocular Movement      Right Left    Full Full       Neuro/Psych    Oriented x3: Yes   Mood/Affect: Normal       Dilation    Both eyes: 1.0% Mydriacyl, 2.5% Phenylephrine @ 9:21 AM        Slit Lamp and Fundus Exam    External Exam       Right Left   External Normal Normal       Slit Lamp Exam      Right Left   Lids/Lashes Normal Normal   Conjunctiva/Sclera White and quiet White and quiet   Cornea Clear Clear   Anterior Chamber Deep and quiet Deep and quiet   Iris Round and reactive Round and reactive   Lens 1+ Nuclear sclerosis 1+ Nuclear sclerosis   Anterior Vitreous Normal, no pigment Normal       Fundus Exam      Right Left   Posterior Vitreous Posterior vitreous detachment, Central vitreous floaters Posterior vitreous detachment, Central vitreous floaters   Disc Peripapillary atrophy Peripapillary atrophy   C/D Ratio 0.25 0.3   Macula Normal Normal   Vessels Normal Normal   Periphery 20D, 28D lens to the ora, scleral depressed exam, attached 360 degrees, no holes or tears Normal, no holes or tears          IMAGING AND PROCEDURES  Imaging and Procedures for 07/28/19  Color Fundus Photography Optos - OU - Both Eyes       Right Eye Progression has been stable. Disc findings include normal observations. Macula : normal observations. Vessels : normal observations. Periphery : normal observations.   Left Eye Progression has been stable. Disc findings include normal observations. Macula : normal observations. Vessels : normal observations. Periphery : normal observations.   Notes Media opacity, with vitreous debris, central vitreous floaters and posterior vitreous detachment OU, stable no retinal holes or tears                ASSESSMENT/PLAN:  Posterior vitreous detachment of right eye No retinal holes or tears, no pigmentary change in the anterior vitreous  Vitreous floaters, right Vitreous floaters of the right eye have been diminished and there symptomatic involvement with the patient's acuity.  I do suggest observe.      ICD-10-CM   1. Vitreous floaters, right  H43.391 Color Fundus Photography Optos - OU - Both Eyes  2. Posterior vitreous detachment of right eye  H43.811 Color  Fundus  Photography Optos - OU - Both Eyes  3. Posterior vitreous detachment of left eye  H43.812 Color Fundus Photography Optos - OU - Both Eyes    1.  Patient with Dr. Armando Gang is retired and patient has vascular referral.  I have suggested [facility] [address] with Dr. Velna Ochs Drs. Burley Saver and Dr. Roselyn Meier  2.  3.  Ophthalmic Meds Ordered this visit:  No orders of the defined types were placed in this encounter.      No follow-ups on file.  There are no Patient Instructions on file for this visit.   Explained the diagnoses, plan, and follow up with the patient and they expressed understanding.  Patient expressed understanding of the importance of proper follow up care.   Alford Highland Halona Amstutz M.D. Diseases & Surgery of the Retina and Vitreous [facility] 07/28/19     Abbreviations: M myopia (nearsighted); A astigmatism; H hyperopia (farsighted); P presbyopia; Mrx spectacle prescription;  CTL contact lenses; OD right eye; OS left eye; OU both eyes  XT exotropia; ET esotropia; PEK punctate epithelial keratitis; PEE punctate epithelial erosions; DES dry eye syndrome; MGD meibomian gland dysfunction; ATs artificial tears; PFAT's preservative free artificial tears; NSC nuclear sclerotic cataract; PSC posterior subcapsular cataract; ERM epi-retinal membrane; PVD posterior vitreous detachment; RD retinal detachment; DM diabetes mellitus; DR diabetic retinopathy; NPDR non-proliferative diabetic retinopathy; PDR proliferative diabetic retinopathy; CSME clinically significant macular edema; DME diabetic macular edema; dbh dot blot hemorrhages; CWS cotton wool spot; POAG primary open angle glaucoma; C/D cup-to-disc ratio; HVF humphrey visual field; GVF goldmann visual field; OCT optical coherence tomography; IOP intraocular pressure; BRVO Branch retinal vein occlusion; CRVO central retinal vein occlusion; CRAO central retinal artery occlusion; BRAO branch retinal  artery occlusion; RT retinal tear; SB scleral buckle; PPV pars plana vitrectomy; VH Vitreous hemorrhage; PRP panretinal laser photocoagulation; IVK intravitreal kenalog; VMT vitreomacular traction; MH Macular hole;  NVD neovascularization of the disc; NVE neovascularization elsewhere; AREDS age related eye disease study; ARMD age related macular degeneration; POAG primary open angle glaucoma; EBMD epithelial/anterior basement membrane dystrophy; ACIOL anterior chamber intraocular lens; IOL intraocular lens; PCIOL posterior chamber intraocular lens; Phaco/IOL phacoemulsification with intraocular lens placement; PRK photorefractive keratectomy; LASIK laser assisted in situ keratomileusis; HTN hypertension; DM diabetes mellitus; COPD chronic obstructive pulmonary disease

## 2019-07-28 NOTE — Assessment & Plan Note (Signed)
No retinal holes or tears, no pigmentary change in the anterior vitreous

## 2019-08-04 DIAGNOSIS — M25561 Pain in right knee: Secondary | ICD-10-CM | POA: Diagnosis not present

## 2019-08-13 DIAGNOSIS — F411 Generalized anxiety disorder: Secondary | ICD-10-CM | POA: Diagnosis not present

## 2019-08-20 DIAGNOSIS — F411 Generalized anxiety disorder: Secondary | ICD-10-CM | POA: Diagnosis not present

## 2019-08-26 DIAGNOSIS — Z974 Presence of external hearing-aid: Secondary | ICD-10-CM | POA: Diagnosis not present

## 2019-08-26 DIAGNOSIS — H903 Sensorineural hearing loss, bilateral: Secondary | ICD-10-CM | POA: Diagnosis not present

## 2019-08-27 DIAGNOSIS — F411 Generalized anxiety disorder: Secondary | ICD-10-CM | POA: Diagnosis not present

## 2019-09-03 DIAGNOSIS — F411 Generalized anxiety disorder: Secondary | ICD-10-CM | POA: Diagnosis not present

## 2019-09-05 DIAGNOSIS — M25561 Pain in right knee: Secondary | ICD-10-CM | POA: Diagnosis not present

## 2019-09-09 DIAGNOSIS — S83281A Other tear of lateral meniscus, current injury, right knee, initial encounter: Secondary | ICD-10-CM | POA: Diagnosis not present

## 2019-09-16 ENCOUNTER — Encounter: Payer: Self-pay | Admitting: Family Medicine

## 2019-09-16 DIAGNOSIS — E876 Hypokalemia: Secondary | ICD-10-CM

## 2019-09-21 ENCOUNTER — Other Ambulatory Visit: Payer: Self-pay

## 2019-09-21 DIAGNOSIS — L578 Other skin changes due to chronic exposure to nonionizing radiation: Secondary | ICD-10-CM | POA: Diagnosis not present

## 2019-09-21 DIAGNOSIS — L814 Other melanin hyperpigmentation: Secondary | ICD-10-CM | POA: Diagnosis not present

## 2019-09-21 DIAGNOSIS — D225 Melanocytic nevi of trunk: Secondary | ICD-10-CM | POA: Diagnosis not present

## 2019-09-21 DIAGNOSIS — L821 Other seborrheic keratosis: Secondary | ICD-10-CM | POA: Diagnosis not present

## 2019-09-22 ENCOUNTER — Other Ambulatory Visit: Payer: BC Managed Care – PPO

## 2019-09-22 DIAGNOSIS — E876 Hypokalemia: Secondary | ICD-10-CM | POA: Diagnosis not present

## 2019-09-22 LAB — BASIC METABOLIC PANEL
BUN: 11 mg/dL (ref 7–25)
CO2: 31 mmol/L (ref 20–32)
Calcium: 9.8 mg/dL (ref 8.6–10.4)
Chloride: 102 mmol/L (ref 98–110)
Creat: 0.98 mg/dL (ref 0.50–1.05)
Glucose, Bld: 92 mg/dL (ref 65–99)
Potassium: 4.2 mmol/L (ref 3.5–5.3)
Sodium: 141 mmol/L (ref 135–146)

## 2019-09-23 ENCOUNTER — Encounter: Payer: BC Managed Care – PPO | Admitting: Family Medicine

## 2019-09-25 DIAGNOSIS — H9113 Presbycusis, bilateral: Secondary | ICD-10-CM | POA: Diagnosis not present

## 2019-09-25 DIAGNOSIS — H9313 Tinnitus, bilateral: Secondary | ICD-10-CM | POA: Diagnosis not present

## 2019-09-25 DIAGNOSIS — H903 Sensorineural hearing loss, bilateral: Secondary | ICD-10-CM | POA: Diagnosis not present

## 2019-09-25 DIAGNOSIS — H8102 Meniere's disease, left ear: Secondary | ICD-10-CM | POA: Diagnosis not present

## 2019-09-28 ENCOUNTER — Other Ambulatory Visit: Payer: Self-pay | Admitting: Family Medicine

## 2019-09-28 DIAGNOSIS — G8918 Other acute postprocedural pain: Secondary | ICD-10-CM | POA: Diagnosis not present

## 2019-09-28 DIAGNOSIS — M94261 Chondromalacia, right knee: Secondary | ICD-10-CM | POA: Diagnosis not present

## 2019-09-28 DIAGNOSIS — S83271A Complex tear of lateral meniscus, current injury, right knee, initial encounter: Secondary | ICD-10-CM | POA: Diagnosis not present

## 2019-09-28 DIAGNOSIS — X58XXXA Exposure to other specified factors, initial encounter: Secondary | ICD-10-CM | POA: Diagnosis not present

## 2019-09-28 DIAGNOSIS — Y999 Unspecified external cause status: Secondary | ICD-10-CM | POA: Diagnosis not present

## 2019-09-30 ENCOUNTER — Encounter: Payer: BC Managed Care – PPO | Admitting: Family Medicine

## 2019-10-05 DIAGNOSIS — M25561 Pain in right knee: Secondary | ICD-10-CM | POA: Diagnosis not present

## 2019-10-08 DIAGNOSIS — M25561 Pain in right knee: Secondary | ICD-10-CM | POA: Diagnosis not present

## 2019-10-12 DIAGNOSIS — M25561 Pain in right knee: Secondary | ICD-10-CM | POA: Diagnosis not present

## 2019-10-15 DIAGNOSIS — M25561 Pain in right knee: Secondary | ICD-10-CM | POA: Diagnosis not present

## 2019-10-19 DIAGNOSIS — M25561 Pain in right knee: Secondary | ICD-10-CM | POA: Diagnosis not present

## 2019-10-21 DIAGNOSIS — M25561 Pain in right knee: Secondary | ICD-10-CM | POA: Diagnosis not present

## 2019-10-26 DIAGNOSIS — M25561 Pain in right knee: Secondary | ICD-10-CM | POA: Diagnosis not present

## 2019-10-30 DIAGNOSIS — M25561 Pain in right knee: Secondary | ICD-10-CM | POA: Diagnosis not present

## 2019-11-02 DIAGNOSIS — M25561 Pain in right knee: Secondary | ICD-10-CM | POA: Diagnosis not present

## 2019-11-02 DIAGNOSIS — F411 Generalized anxiety disorder: Secondary | ICD-10-CM | POA: Diagnosis not present

## 2019-11-06 DIAGNOSIS — M25561 Pain in right knee: Secondary | ICD-10-CM | POA: Diagnosis not present

## 2019-11-11 DIAGNOSIS — M25561 Pain in right knee: Secondary | ICD-10-CM | POA: Diagnosis not present

## 2019-11-13 DIAGNOSIS — F411 Generalized anxiety disorder: Secondary | ICD-10-CM | POA: Diagnosis not present

## 2019-11-23 ENCOUNTER — Ambulatory Visit (INDEPENDENT_AMBULATORY_CARE_PROVIDER_SITE_OTHER): Payer: BC Managed Care – PPO | Admitting: Family Medicine

## 2019-11-23 ENCOUNTER — Encounter: Payer: Self-pay | Admitting: Family Medicine

## 2019-11-23 ENCOUNTER — Other Ambulatory Visit (HOSPITAL_COMMUNITY)
Admission: RE | Admit: 2019-11-23 | Discharge: 2019-11-23 | Disposition: A | Discharge status: Home / Self Care | Payer: BC Managed Care – PPO | Source: Ambulatory Visit | Attending: Family Medicine | Admitting: Family Medicine

## 2019-11-23 ENCOUNTER — Other Ambulatory Visit: Payer: Self-pay

## 2019-11-23 VITALS — BP 100/56 | HR 108 | Temp 98.4°F | Ht 69.25 in | Wt 199.1 lb

## 2019-11-23 DIAGNOSIS — R7301 Impaired fasting glucose: Secondary | ICD-10-CM

## 2019-11-23 DIAGNOSIS — Z0000 Encounter for general adult medical examination without abnormal findings: Secondary | ICD-10-CM

## 2019-11-23 DIAGNOSIS — H9193 Unspecified hearing loss, bilateral: Secondary | ICD-10-CM | POA: Diagnosis not present

## 2019-11-23 DIAGNOSIS — Z124 Encounter for screening for malignant neoplasm of cervix: Secondary | ICD-10-CM | POA: Diagnosis not present

## 2019-11-23 DIAGNOSIS — I499 Cardiac arrhythmia, unspecified: Secondary | ICD-10-CM

## 2019-11-23 DIAGNOSIS — Z113 Encounter for screening for infections with a predominantly sexual mode of transmission: Secondary | ICD-10-CM | POA: Insufficient documentation

## 2019-11-23 DIAGNOSIS — H9190 Unspecified hearing loss, unspecified ear: Secondary | ICD-10-CM | POA: Insufficient documentation

## 2019-11-23 DIAGNOSIS — E785 Hyperlipidemia, unspecified: Secondary | ICD-10-CM | POA: Diagnosis not present

## 2019-11-23 NOTE — Patient Instructions (Addendum)
Left Bundle Branch Block  Left bundle branch block (LBBB) is a problem with the way that electrical impulses pass through the heart (electrical conduction abnormality). The heart depends on an electrical pulse to beat normally. The electrical signal for a heartbeat starts in the upper chambers of the heart (atria) and then travels to the two lower chambers (left and rightventricles). An LBBB is a partial or complete block of the pathway that carries the signal to the left ventricle. If you have LBBB, the left side of your heart does not beat normally. LBBB may be a warning sign of heart disease. What are the causes? This condition may be caused by:  Heart disease.  Disease of the arteries in the heart (arteriosclerosis).  Stiffening or weakening of heart muscle (cardiomyopathy).  Infection of heart muscle (myocarditis).  High blood pressure. In some cases, the cause may not be known. What increases the risk? The following factors may make you more likely to develop this condition:  Being female.  Being 60 years of age or older.  Having heart disease.  Having had a heart attack or heart surgery. What are the signs or symptoms? This condition may not cause any symptoms. If you do have symptoms, they may include:  Feeling dizzy or light-headed.  Fainting. How is this diagnosed? This condition may be diagnosed based on an electrocardiogram (ECG). It is often diagnosed when an ECG is done as part of a routine physical or to help find the cause of fainting spells. You may also have imaging tests to find out more about your condition. These may include:  Chest X-rays.  Echocardiogram. How is this treated? If you do not have symptoms or any other type of heart disease, you may not need treatment for this condition. However, you may need to see your health care provider more often because LBBB can be a warning sign of future heart problems. You may get treatment for other heart problems  or high blood pressure. If LBBB causes symptoms or other heart problems, you may need to have an electrical device (pacemaker) implanted under the skin of your chest. A pacemaker sends electrical signals to your heart to keep it beating normally. Follow these instructions at home: Lifestyle   Follow instructions from your health care provider about eating or drinking restrictions.  Follow a heart-healthy diet and maintain a healthy weight. Work with a dietitian to create an eating plan that is best for you.  Do not use any products that contain nicotine or tobacco, such as cigarettes, e-cigarettes, and chewing tobacco. If you need help quitting, ask your health care provider. Activity  Get regular exercise as told by your health care provider.  Return to your normal activities as told by your health care provider. Ask your health care provider what activities are safe for you. General instructions  Take over-the-counter and prescription medicines only as told by your health care provider.  Keep all follow-up visits as told by your health care provider. This is important. Contact a health care provider if:  You feel light-headed.  You faint. Get help right away if:  You have chest pain.  You have trouble breathing. These symptoms may represent a serious problem that is an emergency. Do not wait to see if the symptoms will go away. Get medical help right away. Call your local emergency services (911 in the [place] Do not drive yourself to the hospital. Summary  For the heart to beat normally, an electrical signal must  travel to the lower left chamber of the heart. Left bundle branch block (LBBB) is a partial or complete block of the pathway that carries that signal.  This condition may not cause any symptoms. In some cases, a person may feel dizzy or light-headed or may faint.  Treatment may not be needed for LBBB if you do not have symptoms or any other type of heart  disease.  You may need to see your health care provider more often because LBBB can be a warning sign of future heart problems. This information is not intended to replace advice given to you by your health care provider. Make sure you discuss any questions you have with your health care provider. Document Revised: 06/24/2018 Document Reviewed: 06/24/2018 Elsevier Patient Education  2020 Elsevier Inc.  Preventing Hearing Loss, Adult  Hearing loss is a partial or total loss of the ability to hear. Hearing loss may start suddenly or gradually, at any age. It may be temporary or permanent, and it may affect one or both ears. There are two types of hearing loss. You can have just one type or both types. You may have a problem with:  Damage to your hearing nerves (sensorineural hearing loss). This type of hearing loss is more likely to be permanent. A hearing aid is often the best treatment.  Sound getting to your inner ear (conductive hearing loss). This type of hearing loss can usually be treated medically or surgically. Hearing loss may be referred to as deafness. Symptoms that may develop along with hearing loss include ringing in your ear (tinnitus), fullness in your ear, and dizziness (vertigo). Hearing loss that is not associated with aging can often be prevented by taking certain measures to protect your ears. How can hearing loss affect me? Hearing loss can affect you at school, at work, and at home. It can lower your overall quality of life. You may:  Have trouble having conversations, especially in busy places with a lot of background noise.  Need to make changes at work or at school.  Feel depressed, isolated, or anxious. Sometimes hearing loss can make it more difficult to make friends, play sports, and have an active social life.  Struggle to hear the TV, radio, or sound at the movies.  Have trouble hearing the phone or doorbell.  Have trouble hearing alarms and other warning  sounds. What changes can I make to protect myself from hearing loss? Have hearing tests (screenings) as often as directed by your health care provider. Hearing screenings can help detect hearing loss early and may help prevent hearing loss from getting worse. This may include having screenings done by:  An ear, nose, and throat specialist (otolaryngologist or ENT specialist).  A specialist in hearing problems (audiologist). Noise damage is the most preventable cause of hearing loss. Noise damage is caused by the loudness of the noise and how long you are exposed to it. Noises that can cause temporary or permanent nerve damage include noises from:  [employer] or chainsaws.  Guns (firearms).  Sirens.  [employer]. To avoid hearing loss from noise exposure:  Wear ear protection whenever you are exposed to loud noises or working in a noisy environment, such as: ? Using a lawn mower or leaf blower. ? Shooting firearms. ? Woodworking with machines. ? Being near jet engines or sirens.  If you are exposed to loud noise at your job, make sure that you are provided with the proper noise protection.  Do not sit  close to speakers at concerts.  If you listen to music, keep the volume at a comfortable level.  If you wear headphones, make sure that the noise is only loud enough for you to hear. If someone else can hear it, it is too loud. What can I do to cope with hearing loss?  Work with your health care providers to determine what types of treatment are best for you.  If you need a hearing aid, have it fitted by a specialist. Do not buy hearing aids without having a hearing aid evaluation first.  Use assistive hearing devices and warning devices or alarms that vibrate or use lights.  Face people who are talking to you and pay attention to their expressions as they speak. Ask people if they can speak more clearly or more slowly.  Tell friends and family about your hearing loss.  Avoid  areas that have a lot of background noise.  If you are feeling isolated, anxious, or depressed, tell someone. Where to find support For more support:  Talk with your health care provider. Ask about hearing screenings and online or in-person support groups.  Find resources through the Hearing Loss Association of [place]: www.hearingloss.org  Get tips for living with hearing loss from the [employer] for the Deaf and Hard of Hearing: www.agbell.org Where to find more information Find more information about how to prevent hearing loss from:  Centers for Disease Control and Prevention: FootballExhibition.com.br  [employer] on Deafness and Other Communication Disorders: PoolDevices.com.pt Contact a health care provider if you have:  Any change in your hearing.  Sudden hearing loss.  Other symptoms, such as: ? Ear pain. ? Ear pressure. ? Tinnitus. ? Vertigo. Summary  Have your hearing screened to detect early hearing loss and to prevent further loss.  Hearing loss may be caused by damage to your hearing nerves, problems with sound getting to your inner ear, or both.  Avoiding loud noises is the best way to prevent hearing loss.  If your hearing changes suddenly, tell your health care provider right away. This information is not intended to replace advice given to you by your health care provider. Make sure you discuss any questions you have with your health care provider. Document Revised: 09/19/2018 Document Reviewed: 02/13/2017 Elsevier Patient Education  2020 [employer].

## 2019-11-23 NOTE — Progress Notes (Signed)
Heather Chen DOB: Apr 23, 1960 Encounter date: 11/23/2019  This is a 59 y.o. female who presents for complete physical   History of present illness/Additional concerns: Last visit with me was 09/22/2018 for physical and transfer of care.  In spring had telehealth with Dr. Selena Batten. Menieres. Started with diuretic and checking potassium. Has tinnitus and has different sorts of pressure, menieres; follows with Dr. Dorma Russell. Keeping potassium up through diet - eating K rich foods.   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 Follows up with ortho in December. PT is over but she continues to exercise on own. Nearly full normal ROM.    Has been taking bp because it does run low. Sometimes it comes up stating irregular heart rate. Sometimes at night feels it racing. Years ago had test done with Dr. Lovell Sheehan for mitral valve prolapse. Takes bp every morning. Last time HR reads as irregular 9/28; 7/24, 7/4 on blood pressure machine. Doesn't feel anything when it reads this. Doesn't feel that racing sensation every night. Usually HR in 60-70's.    At visit in April - thought she had UTI, but ended up being dx with herpes. Had never had outbreak in past. Started on valtrex and has done well with this. Just taken aback by diagnosis.  Hyperlipidemia: Simvastatin 40 mg daily. Tolerates this well.  Allergies: still doing well with zyrtec, benadryl at bedtime. Follows with [facility]necology: [facility] Follows with dermatology: Dr. Sharyn Lull.  Last mammogram was 02/20/2019.  This was normal. Colonoscopy due 03/2022  She has been following regularly with ophthalmology for evaluation of vitreous floaters as well has posterior vitreous detachment in the right eye. Seeing Dr. Luciana Axe.    Past Medical History:  Diagnosis Date  . Abdominal migraine, not intractable 05/30/2015  . Allergy    SEASONAL  . Anemia   . GERD (gastroesophageal reflux disease)   . H/O  seasonal allergies   . Heart murmur    AS A CHILD  . Hyperlipidemia   . Migraine headache   . Tennis elbow    Past Surgical History:  Procedure Laterality Date  . BREAST BIOPSY Right 1994   benign  . BREAST CYST EXCISION Right    20 years ago  . COLONOSCOPY     Allergies  Allergen Reactions  . Neomycin     rash  . Other     Nickel and formaldehyde causes rash   Current Meds  Medication Sig  . Cetirizine HCl (ZYRTEC PO) Take 10 mg by mouth daily.   . diphenhydrAMINE HCl (BENADRYL PO) Take by mouth at bedtime.  . simvastatin (ZOCOR) 40 MG tablet TAKE 1 TABLET BY MOUTH EVERYDAY AT BEDTIME  . valACYclovir (VALTREX) 500 MG tablet Take 500 mg by mouth daily.   Social History   Tobacco Use  . Smoking status: Former Smoker    Years: 10.00    Types: Cigarettes    Quit date: 01/09/1995    Years since quitting: 24.8  . Smokeless tobacco: Never Used  Substance Use Topics  . Alcohol use: Yes    Alcohol/week: 5.0 standard drinks    Types: 5 Cans of beer per week   Family History  Problem Relation Age of Onset  . Breast cancer Mother 83  . Osteoarthritis Mother 44  . Rheum arthritis Mother 50       psoriatic arthritis, osteoarthritis  . Lymphoma Mother   . Dementia Mother   . Hearing loss Mother   .  Leukemia Father 42       deceased  . Arthritis Father   . Colon polyps Sister   . Rheum arthritis Sister   . Heart disease Paternal Grandmother   . Breast cancer Maternal Grandmother   . Breast cancer Maternal Aunt        x 2  . Alcohol abuse Brother   . Rheum arthritis Sister      Review of Systems  Constitutional: Negative for activity change, appetite change, chills, fatigue, fever and unexpected weight change.  HENT: Positive for hearing loss and tinnitus. Negative for congestion, ear pain, sinus pressure, sinus pain, sore throat and trouble swallowing.   Eyes: Negative for pain and visual disturbance.  Respiratory: Negative for cough, chest tightness, shortness of  breath and wheezing.   Cardiovascular: Positive for palpitations. Negative for chest pain and leg swelling.  Gastrointestinal: Negative for abdominal pain, blood in stool, constipation, diarrhea, nausea and vomiting.  Genitourinary: Negative for difficulty urinating, dysuria, menstrual problem, pelvic pain, urgency, vaginal bleeding, vaginal discharge and vaginal pain.  Musculoskeletal: Negative for arthralgias and back pain.  Skin: Negative for rash.  Neurological: Negative for dizziness, weakness, numbness and headaches.  Hematological: Negative for adenopathy. Does not bruise/bleed easily.  Psychiatric/Behavioral: Negative for sleep disturbance and suicidal ideas. The patient is not nervous/anxious.     CBC:  Lab Results  Component Value Date   WBC 7.5 11/23/2019   HGB 13.9 11/23/2019   HCT 40.5 11/23/2019   MCH 31.0 11/23/2019   MCHC 34.3 11/23/2019   RDW 13.7 11/23/2019   PLT 296 11/23/2019   MPV 11.0 11/23/2019   CMP: Lab Results  Component Value Date   NA 141 11/23/2019   K 3.9 11/23/2019   CL 99 11/23/2019   CO2 31 11/23/2019   GLUCOSE 65 11/23/2019   BUN 13 11/23/2019   CREATININE 0.94 11/23/2019   CALCIUM 9.9 11/23/2019   PROT 7.6 11/23/2019   BILITOT 0.5 11/23/2019   ALKPHOS 73 05/28/2019   ALT 26 11/23/2019   AST 30 11/23/2019   LIPID: Lab Results  Component Value Date   CHOL 177 05/28/2019   TRIG 109.0 05/28/2019   HDL 56.90 05/28/2019   LDLCALC 98 05/28/2019    Objective:  BP (!) 100/56 (BP Location: Left Arm, Patient Position: Sitting, Cuff Size: Large)   Pulse (!) 108   Temp 98.4 F (36.9 C) (Oral)   Ht 5' 9.25" (1.759 m)   Wt 199 lb 1.6 oz (90.3 kg)   LMP 09/09/2014   BMI 29.19 kg/m   Weight: 199 lb 1.6 oz (90.3 kg)   BP Readings from Last 3 Encounters:  11/23/19 (!) 100/56  02/17/19 126/72  09/22/18 100/72   Wt Readings from Last 3 Encounters:  11/23/19 199 lb 1.6 oz (90.3 kg)  02/17/19 197 lb (89.4 kg)  09/22/18 189 lb 6.4 oz  (85.9 kg)    Physical Exam Constitutional:      General: She is not in acute distress.    Appearance: She is well-developed.  HENT:     Head: Normocephalic and atraumatic.     Right Ear: External ear normal.     Left Ear: External ear normal.     Mouth/Throat:     Pharynx: No oropharyngeal exudate.  Eyes:     Conjunctiva/sclera: Conjunctivae normal.     Pupils: Pupils are equal, round, and reactive to light.  Neck:     Thyroid: No thyromegaly.  Cardiovascular:     Rate and Rhythm:  Normal rate and regular rhythm.     Heart sounds: Normal heart sounds. No murmur heard.  No friction rub. No gallop.   Pulmonary:     Effort: Pulmonary effort is normal.     Breath sounds: Normal breath sounds.  Abdominal:     General: Bowel sounds are normal. There is no distension.     Palpations: Abdomen is soft. There is no mass.     Tenderness: There is no abdominal tenderness. There is no guarding.     Hernia: No hernia is present.  Genitourinary:    Exam position: Supine.     Labia:        Right: No rash or tenderness.        Left: No rash or tenderness.      Vagina: Normal.     Cervix: Normal.     Uterus: Normal.      Adnexa: Right adnexa normal and left adnexa normal.  Musculoskeletal:        General: No tenderness or deformity. Normal range of motion.     Cervical back: Normal range of motion and neck supple.  Lymphadenopathy:     Cervical: No cervical adenopathy.  Skin:    General: Skin is warm and dry.     Findings: No rash.  Neurological:     Mental Status: She is alert and oriented to person, place, and time.     Deep Tendon Reflexes: Reflexes normal.     Reflex Scores:      Tricep reflexes are 2+ on the right side and 2+ on the left side.      Bicep reflexes are 2+ on the right side and 2+ on the left side.      Brachioradialis reflexes are 2+ on the right side and 2+ on the left side.      Patellar reflexes are 2+ on the right side and 2+ on the left side. Psychiatric:         Speech: Speech normal.        Behavior: Behavior normal.        Thought Content: Thought content normal.     Assessment/Plan: There are no preventive care reminders to display for this patient. Health Maintenance reviewed - she is up to date with preventative health care.   1. Preventative health care Keep up with regular exercise, healthy eating.   2. Hyperlipidemia, unspecified hyperlipidemia type Continue with zocor 40mg  qhs. Lipids are good. - TSH; Future - TSH  3. Bilateral hearing loss, unspecified hearing loss type Following regularly with ENT. Hearing loss stable. She is careful to prevent exposure to loud noises.   4. Irregular heart beat Notes on rare occasions. Momentary. Never with exercise. ekg stable without acute change today but does show LBBB which was not noted on prior EKG. Will review bloodwork but do not feel strongly that we need further evaluation at this time unless increase in sx. - EKG 12-Lead - CBC with Differential/Platelet; Future - Comprehensive metabolic panel; Future - Comprehensive metabolic panel - CBC with Differential/Platelet  5. Impaired fasting glucose - Hemoglobin A1c; Future - Hemoglobin A1c  6. Cervical cancer screening Was due for repeat in 2 months; completed today.  - PAP [[facility]]  7. Screening examination for STD Very low risk; monogamous relationship. Added on just due to recent findings on gyn exam (see above). No known exposure. - Cervicovaginal ancillary only   Return in about 1 year (around 11/22/2020) for physical exam; and pending bloodwork.  Micheline Rough, MD

## 2019-11-24 LAB — COMPREHENSIVE METABOLIC PANEL
AG Ratio: 1.7 (calc) (ref 1.0–2.5)
ALT: 26 U/L (ref 6–29)
AST: 30 U/L (ref 10–35)
Albumin: 4.8 g/dL (ref 3.6–5.1)
Alkaline phosphatase (APISO): 77 U/L (ref 37–153)
BUN: 13 mg/dL (ref 7–25)
CO2: 31 mmol/L (ref 20–32)
Calcium: 9.9 mg/dL (ref 8.6–10.4)
Chloride: 99 mmol/L (ref 98–110)
Creat: 0.94 mg/dL (ref 0.50–1.05)
Globulin: 2.8 g/dL (calc) (ref 1.9–3.7)
Glucose, Bld: 65 mg/dL (ref 65–99)
Potassium: 3.9 mmol/L (ref 3.5–5.3)
Sodium: 141 mmol/L (ref 135–146)
Total Bilirubin: 0.5 mg/dL (ref 0.2–1.2)
Total Protein: 7.6 g/dL (ref 6.1–8.1)

## 2019-11-24 LAB — CBC WITH DIFFERENTIAL/PLATELET
Absolute Monocytes: 593 cells/uL (ref 200–950)
Basophils Absolute: 53 cells/uL (ref 0–200)
Basophils Relative: 0.7 %
Eosinophils Absolute: 120 cells/uL (ref 15–500)
Eosinophils Relative: 1.6 %
HCT: 40.5 % (ref 35.0–45.0)
Hemoglobin: 13.9 g/dL (ref 11.7–15.5)
Lymphs Abs: 2498 cells/uL (ref 850–3900)
MCH: 31 pg (ref 27.0–33.0)
MCHC: 34.3 g/dL (ref 32.0–36.0)
MCV: 90.4 fL (ref 80.0–100.0)
MPV: 11 fL (ref 7.5–12.5)
Monocytes Relative: 7.9 %
Neutro Abs: 4238 cells/uL (ref 1500–7800)
Neutrophils Relative %: 56.5 %
Platelets: 296 10*3/uL (ref 140–400)
RBC: 4.48 10*6/uL (ref 3.80–5.10)
RDW: 13.7 % (ref 11.0–15.0)
Total Lymphocyte: 33.3 %
WBC: 7.5 10*3/uL (ref 3.8–10.8)

## 2019-11-24 LAB — CERVICOVAGINAL ANCILLARY ONLY
Bacterial Vaginitis (gardnerella): NEGATIVE
Candida Glabrata: NEGATIVE
Candida Vaginitis: NEGATIVE
Chlamydia: NEGATIVE
Comment: NEGATIVE
Comment: NEGATIVE
Comment: NEGATIVE
Comment: NEGATIVE
Comment: NEGATIVE
Comment: NORMAL
Neisseria Gonorrhea: NEGATIVE
Trichomonas: NEGATIVE

## 2019-11-24 LAB — CYTOLOGY - PAP
Comment: NEGATIVE
Diagnosis: NEGATIVE
High risk HPV: NEGATIVE

## 2019-11-24 LAB — HEMOGLOBIN A1C
Hgb A1c MFr Bld: 5.3 % of total Hgb (ref <5.7)
Mean Plasma Glucose: 105 (calc)
eAG (mmol/L): 5.8 (calc)

## 2019-11-24 LAB — TSH: TSH: 1.59 mIU/L (ref 0.40–4.50)

## 2019-11-26 ENCOUNTER — Encounter: Payer: Self-pay | Admitting: Family Medicine

## 2019-11-26 DIAGNOSIS — Z8262 Family history of osteoporosis: Secondary | ICD-10-CM

## 2019-11-26 DIAGNOSIS — E2839 Other primary ovarian failure: Secondary | ICD-10-CM

## 2019-12-01 NOTE — Addendum Note (Signed)
Addended by: Wynn Banker on: 12/01/2019 12:06 PM   Modules accepted: Orders

## 2019-12-14 DIAGNOSIS — F411 Generalized anxiety disorder: Secondary | ICD-10-CM | POA: Diagnosis not present

## 2019-12-17 DIAGNOSIS — H52223 Regular astigmatism, bilateral: Secondary | ICD-10-CM | POA: Diagnosis not present

## 2019-12-17 DIAGNOSIS — H524 Presbyopia: Secondary | ICD-10-CM | POA: Diagnosis not present

## 2019-12-17 DIAGNOSIS — H5213 Myopia, bilateral: Secondary | ICD-10-CM | POA: Diagnosis not present

## 2019-12-17 DIAGNOSIS — H2513 Age-related nuclear cataract, bilateral: Secondary | ICD-10-CM | POA: Diagnosis not present

## 2019-12-28 ENCOUNTER — Other Ambulatory Visit: Payer: Self-pay | Admitting: Family Medicine

## 2019-12-28 DIAGNOSIS — Z0000 Encounter for general adult medical examination without abnormal findings: Secondary | ICD-10-CM

## 2019-12-29 ENCOUNTER — Other Ambulatory Visit: Payer: Self-pay

## 2019-12-29 ENCOUNTER — Ambulatory Visit
Admission: RE | Admit: 2019-12-29 | Discharge: 2019-12-29 | Disposition: A | Discharge status: Home / Self Care | Payer: BC Managed Care – PPO | Source: Ambulatory Visit | Attending: Family Medicine | Admitting: Family Medicine

## 2019-12-29 DIAGNOSIS — E2839 Other primary ovarian failure: Secondary | ICD-10-CM

## 2019-12-29 DIAGNOSIS — Z8262 Family history of osteoporosis: Secondary | ICD-10-CM

## 2019-12-29 DIAGNOSIS — Z13820 Encounter for screening for osteoporosis: Secondary | ICD-10-CM | POA: Diagnosis not present

## 2019-12-29 DIAGNOSIS — Z780 Asymptomatic menopausal state: Secondary | ICD-10-CM | POA: Diagnosis not present

## 2019-12-31 DIAGNOSIS — F411 Generalized anxiety disorder: Secondary | ICD-10-CM | POA: Diagnosis not present

## 2020-01-06 DIAGNOSIS — F411 Generalized anxiety disorder: Secondary | ICD-10-CM | POA: Diagnosis not present

## 2020-01-21 DIAGNOSIS — H01119 Allergic dermatitis of unspecified eye, unspecified eyelid: Secondary | ICD-10-CM | POA: Diagnosis not present

## 2020-01-27 DIAGNOSIS — F411 Generalized anxiety disorder: Secondary | ICD-10-CM | POA: Diagnosis not present

## 2020-02-03 DIAGNOSIS — H9313 Tinnitus, bilateral: Secondary | ICD-10-CM | POA: Diagnosis not present

## 2020-02-03 DIAGNOSIS — H8103 Meniere's disease, bilateral: Secondary | ICD-10-CM | POA: Diagnosis not present

## 2020-02-03 DIAGNOSIS — R42 Dizziness and giddiness: Secondary | ICD-10-CM | POA: Diagnosis not present

## 2020-02-03 DIAGNOSIS — H903 Sensorineural hearing loss, bilateral: Secondary | ICD-10-CM | POA: Diagnosis not present

## 2020-02-19 DIAGNOSIS — F411 Generalized anxiety disorder: Secondary | ICD-10-CM | POA: Diagnosis not present

## 2020-02-23 ENCOUNTER — Encounter: Payer: Self-pay | Admitting: Family Medicine

## 2020-02-24 ENCOUNTER — Other Ambulatory Visit: Payer: BC Managed Care – PPO

## 2020-02-24 ENCOUNTER — Other Ambulatory Visit: Payer: Self-pay

## 2020-02-24 ENCOUNTER — Ambulatory Visit
Admission: RE | Admit: 2020-02-24 | Discharge: 2020-02-24 | Disposition: A | Discharge status: Home / Self Care | Payer: BC Managed Care – PPO | Source: Ambulatory Visit | Attending: Family Medicine | Admitting: Family Medicine

## 2020-02-24 DIAGNOSIS — Z1231 Encounter for screening mammogram for malignant neoplasm of breast: Secondary | ICD-10-CM | POA: Diagnosis not present

## 2020-02-24 DIAGNOSIS — Z0000 Encounter for general adult medical examination without abnormal findings: Secondary | ICD-10-CM

## 2020-02-26 DIAGNOSIS — M25561 Pain in right knee: Secondary | ICD-10-CM | POA: Diagnosis not present

## 2020-03-12 ENCOUNTER — Other Ambulatory Visit: Payer: Self-pay | Admitting: Family Medicine

## 2020-03-21 DIAGNOSIS — F411 Generalized anxiety disorder: Secondary | ICD-10-CM | POA: Diagnosis not present

## 2020-03-24 DIAGNOSIS — H903 Sensorineural hearing loss, bilateral: Secondary | ICD-10-CM | POA: Diagnosis not present

## 2020-03-24 DIAGNOSIS — H8102 Meniere's disease, left ear: Secondary | ICD-10-CM | POA: Diagnosis not present

## 2020-03-24 DIAGNOSIS — H9313 Tinnitus, bilateral: Secondary | ICD-10-CM | POA: Diagnosis not present

## 2020-03-24 DIAGNOSIS — G43109 Migraine with aura, not intractable, without status migrainosus: Secondary | ICD-10-CM | POA: Diagnosis not present

## 2020-03-30 DIAGNOSIS — F411 Generalized anxiety disorder: Secondary | ICD-10-CM | POA: Diagnosis not present

## 2020-04-06 DIAGNOSIS — F411 Generalized anxiety disorder: Secondary | ICD-10-CM | POA: Diagnosis not present

## 2020-04-11 DIAGNOSIS — L820 Inflamed seborrheic keratosis: Secondary | ICD-10-CM | POA: Diagnosis not present

## 2020-04-12 DIAGNOSIS — F411 Generalized anxiety disorder: Secondary | ICD-10-CM | POA: Diagnosis not present

## 2020-05-17 ENCOUNTER — Other Ambulatory Visit: Payer: Self-pay | Admitting: Family Medicine

## 2020-05-17 DIAGNOSIS — S8001XA Contusion of right knee, initial encounter: Secondary | ICD-10-CM | POA: Diagnosis not present

## 2020-05-18 DIAGNOSIS — F411 Generalized anxiety disorder: Secondary | ICD-10-CM | POA: Diagnosis not present

## 2020-07-13 DIAGNOSIS — F411 Generalized anxiety disorder: Secondary | ICD-10-CM | POA: Diagnosis not present

## 2020-07-14 DIAGNOSIS — Z6829 Body mass index (BMI) 29.0-29.9, adult: Secondary | ICD-10-CM | POA: Diagnosis not present

## 2020-07-14 DIAGNOSIS — Z01419 Encounter for gynecological examination (general) (routine) without abnormal findings: Secondary | ICD-10-CM | POA: Diagnosis not present

## 2020-07-17 ENCOUNTER — Encounter: Payer: Self-pay | Admitting: Family Medicine

## 2020-07-17 DIAGNOSIS — E876 Hypokalemia: Secondary | ICD-10-CM

## 2020-07-17 DIAGNOSIS — Z13220 Encounter for screening for lipoid disorders: Secondary | ICD-10-CM

## 2020-07-18 DIAGNOSIS — F411 Generalized anxiety disorder: Secondary | ICD-10-CM | POA: Diagnosis not present

## 2020-07-20 ENCOUNTER — Other Ambulatory Visit: Payer: Self-pay

## 2020-07-20 ENCOUNTER — Other Ambulatory Visit (INDEPENDENT_AMBULATORY_CARE_PROVIDER_SITE_OTHER): Payer: BC Managed Care – PPO

## 2020-07-20 DIAGNOSIS — Z13220 Encounter for screening for lipoid disorders: Secondary | ICD-10-CM | POA: Diagnosis not present

## 2020-07-20 DIAGNOSIS — E876 Hypokalemia: Secondary | ICD-10-CM

## 2020-07-20 LAB — COMPREHENSIVE METABOLIC PANEL
ALT: 18 U/L (ref 0–35)
AST: 19 U/L (ref 0–37)
Albumin: 4.4 g/dL (ref 3.5–5.2)
Alkaline Phosphatase: 84 U/L (ref 39–117)
BUN: 13 mg/dL (ref 6–23)
CO2: 32 mEq/L (ref 19–32)
Calcium: 9.3 mg/dL (ref 8.4–10.5)
Chloride: 101 mEq/L (ref 96–112)
Creatinine, Ser: 0.97 mg/dL (ref 0.40–1.20)
GFR: 64.17 mL/min (ref >60.00)
Glucose, Bld: 85 mg/dL (ref 70–99)
Potassium: 3.8 mEq/L (ref 3.5–5.1)
Sodium: 141 mEq/L (ref 135–145)
Total Bilirubin: 0.4 mg/dL (ref 0.2–1.2)
Total Protein: 6.6 g/dL (ref 6.0–8.3)

## 2020-07-20 LAB — LIPID PANEL
Cholesterol: 197 mg/dL (ref 0–200)
HDL: 52.5 mg/dL (ref >39.00)
LDL Cholesterol: 117 mg/dL — ABNORMAL HIGH (ref 0–99)
NonHDL: 144.17
Total CHOL/HDL Ratio: 4
Triglycerides: 135 mg/dL (ref 0.0–149.0)
VLDL: 27 mg/dL (ref 0.0–40.0)

## 2020-07-27 ENCOUNTER — Ambulatory Visit (INDEPENDENT_AMBULATORY_CARE_PROVIDER_SITE_OTHER): Payer: BC Managed Care – PPO | Admitting: Ophthalmology

## 2020-07-27 ENCOUNTER — Encounter (INDEPENDENT_AMBULATORY_CARE_PROVIDER_SITE_OTHER): Payer: BC Managed Care – PPO | Admitting: Ophthalmology

## 2020-07-27 ENCOUNTER — Other Ambulatory Visit: Payer: Self-pay

## 2020-07-27 ENCOUNTER — Encounter (INDEPENDENT_AMBULATORY_CARE_PROVIDER_SITE_OTHER): Payer: Self-pay | Admitting: Ophthalmology

## 2020-07-27 DIAGNOSIS — H2513 Age-related nuclear cataract, bilateral: Secondary | ICD-10-CM

## 2020-07-27 DIAGNOSIS — H35371 Puckering of macula, right eye: Secondary | ICD-10-CM

## 2020-07-27 DIAGNOSIS — H43391 Other vitreous opacities, right eye: Secondary | ICD-10-CM

## 2020-07-27 NOTE — Assessment & Plan Note (Signed)
Minor OU no interval change currently no symptoms

## 2020-07-27 NOTE — Progress Notes (Signed)
07/27/2020     CHIEF COMPLAINT Patient presents for Retina Evaluation   HISTORY OF PRESENT ILLNESS: Heather Chen is a 60 y.o. female who presents to the clinic today for:   HPI     Retina Evaluation           Laterality: both eyes         Comments   History of bilateral floaters, history also of PSC cataract and early posterior subcapsular cataract.  Also history of vitreomacular adhesion in the right eye in the past For follow-up at 1 years.      Last edited by Edmon Crape, MD on 07/27/2020  2:11 PM.      Referring physician: Wynn Banker, MD [address] [place],  [place] [address]  HISTORICAL INFORMATION:   Selected notes from the MEDICAL RECORD NUMBER    Lab Results  Component Value Date   HGBA1C 5.3 11/23/2019     CURRENT MEDICATIONS: No current outpatient medications on file. (Ophthalmic Drugs)   No current facility-administered medications for this visit. (Ophthalmic Drugs)   Current Outpatient Medications (Other)  Medication Sig   Cetirizine HCl (ZYRTEC PO) Take 10 mg by mouth daily.    diphenhydrAMINE HCl (BENADRYL PO) Take by mouth at bedtime.   simvastatin (ZOCOR) 40 MG tablet TAKE 1 TABLET BY MOUTH EVERYDAY AT BEDTIME   triamterene-hydrochlorothiazide (DYAZIDE) 37.5-25 MG capsule Take by mouth daily.    valACYclovir (VALTREX) 500 MG tablet TAKE 1 TABLET EVERY DAY BY ORAL ROUTE FOR 5 DAYS.   No current facility-administered medications for this visit. (Other)      REVIEW OF SYSTEMS:    ALLERGIES Allergies  Allergen Reactions   Neomycin     rash   Other     Nickel and formaldehyde causes rash    PAST MEDICAL HISTORY Past Medical History:  Diagnosis Date   Abdominal migraine, not intractable 05/30/2015   Allergy    SEASONAL   Anemia    GERD (gastroesophageal reflux disease)    H/O seasonal allergies    Heart murmur    AS A CHILD   Hyperlipidemia    Migraine headache    Tennis elbow     Past Surgical History:  Procedure Laterality Date   BREAST BIOPSY Right 1994   benign   BREAST CYST EXCISION Right    20 years ago   COLONOSCOPY      FAMILY HISTORY Family History  Problem Relation Age of Onset   Breast cancer Mother 100   Osteoarthritis Mother 19   Rheum arthritis Mother 46       psoriatic arthritis, osteoarthritis   Lymphoma Mother    Dementia Mother    Hearing loss Mother    Leukemia Father 81       deceased   Arthritis Father    Colon polyps Sister    Rheum arthritis Sister    Heart disease Paternal Grandmother    Breast cancer Maternal Grandmother    Breast cancer Maternal Aunt        x 2   Alcohol abuse Brother    Rheum arthritis Sister     SOCIAL HISTORY Social History   Tobacco Use   Smoking status: Former    Years: 10.00    Types: Cigarettes    Quit date: 01/09/1995    Years since quitting: 25.5   Smokeless tobacco: Never  Substance Use Topics   Alcohol use: Yes    Alcohol/week: 5.0 standard drinks  Types: 5 Cans of beer per week   Drug use: No         OPHTHALMIC EXAM:  Base Eye Exam     Visual Acuity (ETDRS)       Right Left   Dist cc 20/25 -1 20/20 -2    Correction: Glasses         Tonometry (Tonopen, 2:15 PM)       Right Left   Pressure 15 10         Pupils       Pupils APD   Right PERRL None   Left PERRL None         Visual Fields       Left Right    Full          Extraocular Movement       Right Left    Full, Ortho Full, Ortho         Neuro/Psych     Oriented x3: Yes   Mood/Affect: Normal         Dilation     Both eyes: 2.5% Phenylephrine, 1.0% Mydriacyl @ 2:15 PM           Slit Lamp and Fundus Exam     External Exam       Right Left   External Normal Normal         Slit Lamp Exam       Right Left   Lids/Lashes Normal Normal   Conjunctiva/Sclera White and quiet White and quiet   Cornea Clear Clear   Anterior Chamber Deep and quiet Deep and quiet   Iris  Round and reactive Round and reactive   Lens 1+ Nuclear sclerosis 1+ Nuclear sclerosis   Anterior Vitreous Normal Normal         Fundus Exam       Right Left   Posterior Vitreous Posterior vitreous detachment, Central vitreous floaters Posterior vitreous detachment, Central vitreous floaters   Disc Peripapillary atrophy Peripapillary atrophy   C/D Ratio 0.25 0.3   Macula Normal Normal   Vessels Normal Normal   Periphery 20D, 28D lens to the ora, scleral depressed exam, attached 360 degrees, no holes or tears Normal, no holes or tears            IMAGING AND PROCEDURES  Imaging and Procedures for 07/27/20  OCT, Retina - OU - Both Eyes       Right Eye Quality was good. Scan locations included subfoveal. Central Foveal Thickness: 279. Progression has been stable. Findings include normal observations, normal foveal contour, epiretinal membrane.   Left Eye Quality was good. Scan locations included subfoveal. Central Foveal Thickness: 264. Progression has been stable. Findings include normal observations, normal foveal contour.   Notes Vitreous detachment OD, with vitreous debris and floaters and and a retinal shadowing OD.  Vitreous debris, no change over time.  With minor epiretinal membrane nasal aspect of the macula, with absolutely no topographic distortion no changes    OS with prior posterior vitreous detachment             ASSESSMENT/PLAN:  Nuclear sclerotic cataract of both eyes Minor OU no interval change currently no symptoms  Right epiretinal membrane Minor no topographic distortion observed     ICD-10-CM   1. Nuclear sclerotic cataract of both eyes  H25.13 OCT, Retina - OU - Both Eyes    2. Right epiretinal membrane  H35.371       1.  Today we had  a long discussion regarding cataracts with the's analogy being that of like dark green sunglasses.  She understands that once cataract surgery is undertaken likely within the next 10 years, all of her  refractive needs to be taken care of for distance with excellent distance viewing viewing and potentially near vision as well depending on the choices she makes under direction of Dr. Velna Ochs regarding her cataract surgery and pseudophakic restoration process  2.  3.  Ophthalmic Meds Ordered this visit:  No orders of the defined types were placed in this encounter.      Return in about 1 year (around 07/27/2021) for DILATE OU, OCT.  There are no Patient Instructions on file for this visit.   Explained the diagnoses, plan, and follow up with the patient and they expressed understanding.  Patient expressed understanding of the importance of proper follow up care.   Alford Highland Isaly Fasching M.D. Diseases & Surgery of the Retina and Vitreous [facility] 07/27/20     Abbreviations: M myopia (nearsighted); A astigmatism; H hyperopia (farsighted); P presbyopia; Mrx spectacle prescription;  CTL contact lenses; OD right eye; OS left eye; OU both eyes  XT exotropia; ET esotropia; PEK punctate epithelial keratitis; PEE punctate epithelial erosions; DES dry eye syndrome; MGD meibomian gland dysfunction; ATs artificial tears; PFAT's preservative free artificial tears; NSC nuclear sclerotic cataract; PSC posterior subcapsular cataract; ERM epi-retinal membrane; PVD posterior vitreous detachment; RD retinal detachment; DM diabetes mellitus; DR diabetic retinopathy; NPDR non-proliferative diabetic retinopathy; PDR proliferative diabetic retinopathy; CSME clinically significant macular edema; DME diabetic macular edema; dbh dot blot hemorrhages; CWS cotton wool spot; POAG primary open angle glaucoma; C/D cup-to-disc ratio; HVF humphrey visual field; GVF goldmann visual field; OCT optical coherence tomography; IOP intraocular pressure; BRVO Branch retinal vein occlusion; CRVO central retinal vein occlusion; CRAO central retinal artery occlusion; BRAO branch retinal artery occlusion; RT retinal  tear; SB scleral buckle; PPV pars plana vitrectomy; VH Vitreous hemorrhage; PRP panretinal laser photocoagulation; IVK intravitreal kenalog; VMT vitreomacular traction; MH Macular hole;  NVD neovascularization of the disc; NVE neovascularization elsewhere; AREDS age related eye disease study; ARMD age related macular degeneration; POAG primary open angle glaucoma; EBMD epithelial/anterior basement membrane dystrophy; ACIOL anterior chamber intraocular lens; IOL intraocular lens; PCIOL posterior chamber intraocular lens; Phaco/IOL phacoemulsification with intraocular lens placement; PRK photorefractive keratectomy; LASIK laser assisted in situ keratomileusis; HTN hypertension; DM diabetes mellitus; COPD chronic obstructive pulmonary disease

## 2020-07-27 NOTE — Assessment & Plan Note (Signed)
Minor no topographic distortion observed 

## 2020-07-27 NOTE — Assessment & Plan Note (Signed)
No change over time ?

## 2020-08-15 ENCOUNTER — Other Ambulatory Visit: Payer: Self-pay | Admitting: Family Medicine

## 2020-08-24 ENCOUNTER — Encounter (INDEPENDENT_AMBULATORY_CARE_PROVIDER_SITE_OTHER): Payer: BC Managed Care – PPO | Admitting: Ophthalmology

## 2020-09-05 DIAGNOSIS — F411 Generalized anxiety disorder: Secondary | ICD-10-CM | POA: Diagnosis not present

## 2020-09-21 DIAGNOSIS — D225 Melanocytic nevi of trunk: Secondary | ICD-10-CM | POA: Diagnosis not present

## 2020-09-21 DIAGNOSIS — L814 Other melanin hyperpigmentation: Secondary | ICD-10-CM | POA: Diagnosis not present

## 2020-09-21 DIAGNOSIS — L821 Other seborrheic keratosis: Secondary | ICD-10-CM | POA: Diagnosis not present

## 2020-09-21 DIAGNOSIS — L578 Other skin changes due to chronic exposure to nonionizing radiation: Secondary | ICD-10-CM | POA: Diagnosis not present

## 2020-09-22 DIAGNOSIS — Z974 Presence of external hearing-aid: Secondary | ICD-10-CM | POA: Diagnosis not present

## 2020-09-22 DIAGNOSIS — H9313 Tinnitus, bilateral: Secondary | ICD-10-CM | POA: Diagnosis not present

## 2020-09-22 DIAGNOSIS — H8102 Meniere's disease, left ear: Secondary | ICD-10-CM | POA: Diagnosis not present

## 2020-09-22 DIAGNOSIS — G43109 Migraine with aura, not intractable, without status migrainosus: Secondary | ICD-10-CM | POA: Diagnosis not present

## 2020-09-22 DIAGNOSIS — H903 Sensorineural hearing loss, bilateral: Secondary | ICD-10-CM | POA: Diagnosis not present

## 2020-11-23 ENCOUNTER — Encounter: Payer: Self-pay | Admitting: Family Medicine

## 2020-11-23 ENCOUNTER — Ambulatory Visit (INDEPENDENT_AMBULATORY_CARE_PROVIDER_SITE_OTHER): Payer: BC Managed Care – PPO | Admitting: Family Medicine

## 2020-11-23 VITALS — BP 98/68 | HR 77 | Temp 98.2°F | Ht 69.0 in | Wt 201.8 lb

## 2020-11-23 DIAGNOSIS — Z8249 Family history of ischemic heart disease and other diseases of the circulatory system: Secondary | ICD-10-CM | POA: Diagnosis not present

## 2020-11-23 DIAGNOSIS — Z0000 Encounter for general adult medical examination without abnormal findings: Secondary | ICD-10-CM

## 2020-11-23 DIAGNOSIS — J309 Allergic rhinitis, unspecified: Secondary | ICD-10-CM

## 2020-11-23 DIAGNOSIS — H8109 Meniere's disease, unspecified ear: Secondary | ICD-10-CM

## 2020-11-23 DIAGNOSIS — M2550 Pain in unspecified joint: Secondary | ICD-10-CM | POA: Diagnosis not present

## 2020-11-23 DIAGNOSIS — E559 Vitamin D deficiency, unspecified: Secondary | ICD-10-CM

## 2020-11-23 DIAGNOSIS — E785 Hyperlipidemia, unspecified: Secondary | ICD-10-CM

## 2020-11-23 LAB — LIPID PANEL
Cholesterol: 213 mg/dL — ABNORMAL HIGH (ref 0–200)
HDL: 57.1 mg/dL (ref >39.00)
LDL Cholesterol: 131 mg/dL — ABNORMAL HIGH (ref 0–99)
NonHDL: 155.86
Total CHOL/HDL Ratio: 4
Triglycerides: 126 mg/dL (ref 0.0–149.0)
VLDL: 25.2 mg/dL (ref 0.0–40.0)

## 2020-11-23 LAB — CBC WITH DIFFERENTIAL/PLATELET
Basophils Absolute: 0.1 10*3/uL (ref 0.0–0.1)
Basophils Relative: 0.9 % (ref 0.0–3.0)
Eosinophils Absolute: 0.1 10*3/uL (ref 0.0–0.7)
Eosinophils Relative: 2.2 % (ref 0.0–5.0)
HCT: 40.7 % (ref 36.0–46.0)
Hemoglobin: 13.6 g/dL (ref 12.0–15.0)
Lymphocytes Relative: 33.9 % (ref 12.0–46.0)
Lymphs Abs: 1.8 10*3/uL (ref 0.7–4.0)
MCHC: 33.4 g/dL (ref 30.0–36.0)
MCV: 91.7 fl (ref 78.0–100.0)
Monocytes Absolute: 0.3 10*3/uL (ref 0.1–1.0)
Monocytes Relative: 6 % (ref 3.0–12.0)
Neutro Abs: 3.1 10*3/uL (ref 1.4–7.7)
Neutrophils Relative %: 57 % (ref 43.0–77.0)
Platelets: 238 10*3/uL (ref 150.0–400.0)
RBC: 4.44 Mil/uL (ref 3.87–5.11)
RDW: 14 % (ref 11.5–15.5)
WBC: 5.4 10*3/uL (ref 4.0–10.5)

## 2020-11-23 LAB — SEDIMENTATION RATE: Sed Rate: 17 mm/hr (ref 0–30)

## 2020-11-23 LAB — COMPREHENSIVE METABOLIC PANEL
ALT: 19 U/L (ref 0–35)
AST: 24 U/L (ref 0–37)
Albumin: 4.7 g/dL (ref 3.5–5.2)
Alkaline Phosphatase: 78 U/L (ref 39–117)
BUN: 14 mg/dL (ref 6–23)
CO2: 31 mEq/L (ref 19–32)
Calcium: 9.7 mg/dL (ref 8.4–10.5)
Chloride: 101 mEq/L (ref 96–112)
Creatinine, Ser: 0.99 mg/dL (ref 0.40–1.20)
GFR: 62.47 mL/min (ref >60.00)
Glucose, Bld: 92 mg/dL (ref 70–99)
Potassium: 3.8 mEq/L (ref 3.5–5.1)
Sodium: 140 mEq/L (ref 135–145)
Total Bilirubin: 0.6 mg/dL (ref 0.2–1.2)
Total Protein: 7.4 g/dL (ref 6.0–8.3)

## 2020-11-23 LAB — C-REACTIVE PROTEIN: CRP: <1 mg/dL (ref 0.5–20.0)

## 2020-11-23 NOTE — Progress Notes (Signed)
Heather Chen DOB: [birth year] Encounter date: 11/23/2020  This is a 60 y.o. female who presents for complete physical   History of present illness/Additional concerns: Last visit with me was 1 year ago for physical.   Two sisters with osteoporosis. Mom had osto, psoriatic, rheumatoid arthritis. One of sisters also has autoimmune arthritis. Noting that she is getting more arthritis in hands. Larey Seat recently at [employer]. Held self with pinky finger right. Just worried about other arthritis going on. Hands achy sometimes. Had to get rings resized due to knuckles. Noted hands more in last year. Uses hands a lot. Generally doesn't have morning pain. Notes more with griping tools, yard work but feels low level.   Working out with trainer regularly. Does well with this. Other joints are fine - hips, shoulders, elbows, wrists.   Cochlear menieres - follows with ENT. Taking singulair in fall, spring, taking zyrtec. Taking benadryl from derm for eczema. Hearing has been stable. Hasn't had vertigo related to menieres.   Husband having cardiac issues; she is wondering about further eval for her. Interested in having coronary calcium testing.   Started weight watchers a week ago. Working on getting back on track with eating and weight. Had been very successful with weight loss in past. If she has irregular heart rate it seems related to sugar/ overating. Not regular. Episodes are months apart. Very short. Thinks it will improve with healthier eating. Due to ears ends up eating pretty healthy overall.   HL: taking zocor 40mg  daily.   Allergies: still doing well with zyrtec, benadryl at bedtime. Follows with gynecology: [facility]. Has had some infected bumps just inside labia. Was told to use hibicleanse a couple of times/week. Getting these every 4-5 months.   Follows with dermatology: [facility].  Last mammogram was 02/29/2020.  This was normal. Colonoscopy due 03/2022    Seeing Dr. 04/2022 for ophthalmology.  Past Medical History:  Diagnosis Date   Abdominal migraine, not intractable 05/30/2015   Allergy    SEASONAL   Anemia    GERD (gastroesophageal reflux disease)    H/O seasonal allergies    Heart murmur    AS A CHILD   Hyperlipidemia    Migraine headache    Tennis elbow    Past Surgical History:  Procedure Laterality Date   BREAST BIOPSY Right 1994   benign   BREAST CYST EXCISION Right    20 years ago   COLONOSCOPY     Allergies  Allergen Reactions   Neomycin     rash   Other     Nickel and formaldehyde causes rash   Current Meds  Medication Sig   Cetirizine HCl (ZYRTEC PO) Take 10 mg by mouth daily.    diphenhydrAMINE HCl (BENADRYL PO) Take by mouth at bedtime.   montelukast (SINGULAIR) 10 MG tablet Take 10 mg by mouth as needed.   simvastatin (ZOCOR) 40 MG tablet TAKE 1 TABLET BY MOUTH EVERYDAY AT BEDTIME   valACYclovir (VALTREX) 500 MG tablet TAKE 1 TABLET EVERY DAY BY ORAL ROUTE FOR 5 DAYS.   Social History   Tobacco Use   Smoking status: Former    Years: 10.00    Types: Cigarettes    Quit date: 01/09/1995    Years since quitting: 25.8   Smokeless tobacco: Never  Substance Use Topics   Alcohol use: Yes    Alcohol/week: 5.0 standard drinks    Types: 5 Cans of beer per week   Family History  Problem Relation Age of Onset   Breast cancer Mother 87   Osteoarthritis Mother 48   Rheum arthritis Mother 13       psoriatic arthritis, osteoarthritis   Lymphoma Mother    Dementia Mother    Hearing loss Mother    Leukemia Father 17       deceased   Arthritis Father    Colon polyps Sister    Rheum arthritis Sister    Heart disease Paternal Grandmother    Breast cancer Maternal Grandmother    Breast cancer Maternal Aunt        x 2   Alcohol abuse Brother    Rheum arthritis Sister      Review of Systems  Constitutional:  Negative for activity change, appetite change, chills, fatigue, fever and unexpected weight  change.  HENT:  Negative for congestion, ear pain, hearing loss, sinus pressure, sinus pain, sore throat and trouble swallowing.   Eyes:  Negative for pain and visual disturbance.  Respiratory:  Negative for cough, chest tightness, shortness of breath and wheezing.   Cardiovascular:  Negative for chest pain, palpitations and leg swelling.  Gastrointestinal:  Negative for abdominal pain, blood in stool, constipation, diarrhea, nausea and vomiting.  Genitourinary:  Negative for difficulty urinating and menstrual problem.  Musculoskeletal:  Negative for arthralgias and back pain.  Skin:  Negative for rash.  Neurological:  Negative for dizziness, weakness, numbness and headaches.  Hematological:  Negative for adenopathy. Does not bruise/bleed easily.  Psychiatric/Behavioral:  Negative for sleep disturbance and suicidal ideas. The patient is not nervous/anxious.    CBC:  Lab Results  Component Value Date   WBC 5.4 11/23/2020   HGB 13.6 11/23/2020   HCT 40.7 11/23/2020   MCH 31.0 11/23/2019   MCHC 33.4 11/23/2020   RDW 14.0 11/23/2020   PLT 238.0 11/23/2020   MPV 11.0 11/23/2019   CMP: Lab Results  Component Value Date   NA 140 11/23/2020   K 3.8 11/23/2020   CL 101 11/23/2020   CO2 31 11/23/2020   GLUCOSE 92 11/23/2020   BUN 14 11/23/2020   CREATININE 0.99 11/23/2020   CREATININE 0.94 11/23/2019   CALCIUM 9.7 11/23/2020   PROT 7.4 11/23/2020   BILITOT 0.6 11/23/2020   ALKPHOS 78 11/23/2020   ALT 19 11/23/2020   AST 24 11/23/2020   LIPID: Lab Results  Component Value Date   CHOL 213 (H) 11/23/2020   TRIG 126.0 11/23/2020   HDL 57.10 11/23/2020   LDLCALC 131 (H) 11/23/2020    Objective:  BP 98/68 (BP Location: Left Arm, Patient Position: Sitting, Cuff Size: Large)   Pulse 77   Temp 98.2 F (36.8 C) (Oral)   Ht 5\' 9"  (1.753 m)   Wt 201 lb 12.8 oz (91.5 kg)   LMP 09/09/2014   SpO2 98%   BMI 29.80 kg/m   Weight: 201 lb 12.8 oz (91.5 kg)   BP Readings from Last 3  Encounters:  11/23/20 98/68  11/23/19 (!) 100/56  02/17/19 126/72   Wt Readings from Last 3 Encounters:  11/23/20 201 lb 12.8 oz (91.5 kg)  11/23/19 199 lb 1.6 oz (90.3 kg)  02/17/19 197 lb (89.4 kg)    Physical Exam Constitutional:      General: She is not in acute distress.    Appearance: She is well-developed.  HENT:     Head: Normocephalic and atraumatic.     Right Ear: External ear normal.     Left Ear: External ear normal.  Mouth/Throat:     Pharynx: No oropharyngeal exudate.  Eyes:     Conjunctiva/sclera: Conjunctivae normal.     Pupils: Pupils are equal, round, and reactive to light.  Neck:     Thyroid: No thyromegaly.  Cardiovascular:     Rate and Rhythm: Normal rate and regular rhythm.     Heart sounds: Normal heart sounds. No murmur heard.   No friction rub. No gallop.  Pulmonary:     Effort: Pulmonary effort is normal.     Breath sounds: Normal breath sounds.  Abdominal:     General: Bowel sounds are normal. There is no distension.     Palpations: Abdomen is soft. There is no mass.     Tenderness: There is no abdominal tenderness. There is no guarding.     Hernia: No hernia is present.  Musculoskeletal:        General: No tenderness or deformity. Normal range of motion.     Cervical back: Normal range of motion and neck supple.     Comments: Heberdens nodules, no other noted bogginess in IP joints  Lymphadenopathy:     Cervical: No cervical adenopathy.  Skin:    General: Skin is warm and dry.     Findings: No rash.  Neurological:     Mental Status: She is alert and oriented to person, place, and time.     Deep Tendon Reflexes: Reflexes normal.     Reflex Scores:      Tricep reflexes are 2+ on the right side and 2+ on the left side.      Bicep reflexes are 2+ on the right side and 2+ on the left side.      Brachioradialis reflexes are 2+ on the right side and 2+ on the left side.      Patellar reflexes are 2+ on the right side and 2+ on the left  side. Psychiatric:        Speech: Speech normal.        Behavior: Behavior normal.        Thought Content: Thought content normal.    Assessment/Plan: Health Maintenance Due  Topic Date Due   Pneumococcal Vaccine 72-76 Years old (1 - PCV) Never done   Health Maintenance reviewed.  1. Preventative health care Keep up with regular activity.   2. Hyperlipidemia, unspecified hyperlipidemia type She would like to have coronary calcium scoring for screening purposes after husband had it done. She is taking zocor 40mg  at bedtime.  - Comprehensive metabolic panel; Future - Lipid panel; Future - CT CARDIAC SCORING (SELF PAY ONLY); Future  3. Arthralgia, unspecified joint Noted in hands. If abnormal bloodwork would refer to rheumatology; otherwise will monitor. Could consider xrays as well, but hands are not bothering her enough that she wishes to pursue this at current time.  - ANA; Future - Rheumatoid factor; Future - Sedimentation rate; Future - C-reactive protein; Future  4. Family history of heart disease  - CT CARDIAC SCORING (SELF PAY ONLY); Future  5. Allergic rhinitis, unspecified seasonality, unspecified trigger Continue with singulair, zyrtec, benadryl.  - CBC with Differential/Platelet; Future  6. Active cochlear Meniere's disease, unspecified laterality Follows with ENT; currently well controlled and managed with careful sodium monitoring.    Return for pending bloodwork.  Micheline Rough, MD

## 2020-11-25 LAB — ANTI-NUCLEAR AB-TITER (ANA TITER): ANA Titer 1: 1:40 {titer} — ABNORMAL HIGH

## 2020-11-25 LAB — ANA: Anti Nuclear Antibody (ANA): POSITIVE — AB

## 2020-11-25 LAB — RHEUMATOID FACTOR: Rheumatoid fact SerPl-aCnc: <14 IU/mL (ref <14)

## 2020-11-29 ENCOUNTER — Telehealth: Payer: Self-pay | Admitting: Family Medicine

## 2020-11-29 NOTE — Telephone Encounter (Signed)
Patient returned Heather Chen's call about lab results. I let patient know that she was unavailable and would give a call back when she was.   Good callback number is [phone]

## 2020-11-30 NOTE — Telephone Encounter (Signed)
Patient informed-see results note. 

## 2020-12-06 ENCOUNTER — Encounter: Payer: Self-pay | Admitting: Family Medicine

## 2020-12-06 MED ORDER — ATORVASTATIN CALCIUM 20 MG PO TABS: 20.0000 mg | ORAL_TABLET | Freq: Every day | ORAL | 0 refills | Status: DC

## 2020-12-06 NOTE — Addendum Note (Signed)
Addended by: Johnella Moloney on: 12/06/2020 09:35 AM   Modules accepted: Orders

## 2020-12-14 ENCOUNTER — Other Ambulatory Visit: Payer: Self-pay

## 2020-12-14 ENCOUNTER — Ambulatory Visit (INDEPENDENT_AMBULATORY_CARE_PROVIDER_SITE_OTHER)
Admission: RE | Admit: 2020-12-14 | Discharge: 2020-12-14 | Disposition: A | Discharge status: Home / Self Care | Payer: Self-pay | Source: Ambulatory Visit | Attending: Family Medicine | Admitting: Family Medicine

## 2020-12-14 DIAGNOSIS — Z8249 Family history of ischemic heart disease and other diseases of the circulatory system: Secondary | ICD-10-CM

## 2020-12-14 DIAGNOSIS — E785 Hyperlipidemia, unspecified: Secondary | ICD-10-CM

## 2020-12-16 ENCOUNTER — Encounter: Payer: Self-pay | Admitting: Family Medicine

## 2020-12-21 DIAGNOSIS — H524 Presbyopia: Secondary | ICD-10-CM | POA: Diagnosis not present

## 2020-12-21 DIAGNOSIS — H52223 Regular astigmatism, bilateral: Secondary | ICD-10-CM | POA: Diagnosis not present

## 2020-12-21 DIAGNOSIS — H5213 Myopia, bilateral: Secondary | ICD-10-CM | POA: Diagnosis not present

## 2020-12-26 ENCOUNTER — Other Ambulatory Visit: Payer: Self-pay

## 2020-12-26 ENCOUNTER — Encounter: Payer: Self-pay | Admitting: Family Medicine

## 2020-12-26 ENCOUNTER — Ambulatory Visit (INDEPENDENT_AMBULATORY_CARE_PROVIDER_SITE_OTHER): Payer: BC Managed Care – PPO | Admitting: Family Medicine

## 2020-12-26 VITALS — BP 130/74 | HR 88 | Temp 98.0°F | Ht 69.0 in | Wt 203.2 lb

## 2020-12-26 DIAGNOSIS — H8103 Meniere's disease, bilateral: Secondary | ICD-10-CM | POA: Diagnosis not present

## 2020-12-26 DIAGNOSIS — R768 Other specified abnormal immunological findings in serum: Secondary | ICD-10-CM

## 2020-12-26 DIAGNOSIS — H8102 Meniere's disease, left ear: Secondary | ICD-10-CM | POA: Insufficient documentation

## 2020-12-26 DIAGNOSIS — R682 Dry mouth, unspecified: Secondary | ICD-10-CM | POA: Diagnosis not present

## 2020-12-26 NOTE — Patient Instructions (Addendum)
Monitor closely Can try Biotene See dentist.    Worse, can't breathe, etc. To ER  Hold statin for 2 weeks  Merry Christmas.

## 2020-12-26 NOTE — Progress Notes (Signed)
Subjective:     Patient ID: Heather Chen, female    DOB: 1960-12-16, 60 y.o.   MRN: [id number]  Chief Complaint  Patient presents with   Oral Swelling    Bottom lip swelling 2 weeks ago on a Saturday Dry mouth     HPI-Husband on speaker phone on 12/4-[place] felt that [place] lip swelled-no specific injury and didn't feel any bug bite/sting, but wondered if did get something.  Showed friend, but wasn't totally obvious.  Then, started w/Metally taste in mouth.  Dry mouth.  "Not enuf saliva".  Feels like lips and cheeks a little "puffy" esp when chews gum. No new meds yet.  No ACE-I.  Didn't start atorvastatin yet(still on simvastatin) On dyazide for Minier's for over 1 yr..  No new soaps/foods, etc.   No f/c. No sob. Had a CPX last mo and was fine.  D/t fh RA, asked to run more tests-ANA 1:40 and speckled pattern.  Had new toothpaste and stopped 1.5 wks ago. Drinks a lot of water daily.   Started drinking ETOH again few wks ago-Vodka and beer.   Has not checked for covid.     Health Maintenance Due  Topic Date Due   Pneumococcal Vaccine 69-90 Years old (1 - PCV) Never done    Past Medical History:  Diagnosis Date   Abdominal migraine, not intractable 05/30/2015   Allergy    SEASONAL   Anemia    GERD (gastroesophageal reflux disease)    H/O seasonal allergies    Heart murmur    AS A CHILD   Hyperlipidemia    Migraine headache    Tennis elbow     Past Surgical History:  Procedure Laterality Date   BREAST BIOPSY Right 1994   benign   BREAST CYST EXCISION Right    20 years ago   COLONOSCOPY      Outpatient Medications Prior to Visit  Medication Sig Dispense Refill   atorvastatin (LIPITOR) 20 MG tablet Take 1 tablet (20 mg total) by mouth daily. 90 tablet 0   Cetirizine HCl (ZYRTEC PO) Take 10 mg by mouth daily.      diphenhydrAMINE HCl (BENADRYL PO) Take by mouth at bedtime.     montelukast (SINGULAIR) 10 MG tablet  Take 10 mg by mouth as needed.     valACYclovir (VALTREX) 500 MG tablet TAKE 1 TABLET EVERY DAY BY ORAL ROUTE FOR 5 DAYS. 90 tablet 2   triamterene-hydrochlorothiazide (DYAZIDE) 37.5-25 MG capsule Take by mouth daily.      No facility-administered medications prior to visit.    Allergies  Allergen Reactions   Nickel     Other reaction(s): Other (See Comments)   Other     Nickel and formaldehyde causes rash   Formaldehyde Rash   Neomycin Rash    rash   WVP:[id number] except as noted in HPI      Objective:     BP 130/74    Pulse 88    Temp 98 F (36.7 C) (Temporal)    Ht 5\' 9"  (1.753 m)    Wt 203 lb 4 oz (92.2 kg)    LMP 09/09/2014    SpO2 99%    BMI 30.01 kg/m  Wt Readings from Last 3 Encounters:  12/26/20 203 lb 4 oz (92.2 kg)  11/23/20 201 lb 12.8 oz (91.5 kg)  11/23/19 199 lb 1.6 oz (90.3 kg)        Gen: WDWN NAD WF  HEENT: NCAT, conjunctiva not injected, sclera nonicteric TM WNL B.  Has hearing aids. OP moist, no exudates. No obvious lip/tongue swelling.  No palp lesions in mouth NECK:  supple, no thyromegaly, no nodes, no carotid bruits CARDIAC: RRR, S1S2+, no murmur. DP 2+B LUNGS: CTAB. No wheezes EXT:  no edema MSK: no gross abnormalities.  NEURO: A&O x3.  CN II-XII intact.  PSYCH: normal mood. Good eye contact  Discussed labs from CPX-ANA 1:40 speckled Assessment & Plan:   Problem List Items Addressed This Visit       Nervous and Auditory   Meniere's disease of both ears   Other Visit Diagnoses     Dry mouth    -  Primary   Positive ANA (antinuclear antibody)          Dry mouth/metallic taste/"swelling" lips.  Could be allergen-hold statin for now, change tootpaste to nonwhtening, stop ETOH.  Could be bug bite, but doubt as should be resolved by now.  Could be Sjogren's but ANA 1:40 and fairly acute onset-need to keep in mind.    See DDS.  Pt wants to hold on Rheumatology referral to see what DDS input is and if will resolve.  She can  message back either PCP or myself to place referral if changes mind. Mineir's dz.  Doing well on meds. Doubt contributing. +ANA-discussed may be false +.  Plan as above.   No orders of the defined types were placed in this encounter.   Angelena Sole., MD

## 2021-01-04 DIAGNOSIS — F411 Generalized anxiety disorder: Secondary | ICD-10-CM | POA: Diagnosis not present

## 2021-01-10 ENCOUNTER — Other Ambulatory Visit: Payer: Self-pay | Admitting: Family Medicine

## 2021-01-10 DIAGNOSIS — Z1231 Encounter for screening mammogram for malignant neoplasm of breast: Secondary | ICD-10-CM

## 2021-01-12 DIAGNOSIS — F411 Generalized anxiety disorder: Secondary | ICD-10-CM | POA: Diagnosis not present

## 2021-02-11 ENCOUNTER — Other Ambulatory Visit: Payer: Self-pay | Admitting: Family Medicine

## 2021-02-22 DIAGNOSIS — F411 Generalized anxiety disorder: Secondary | ICD-10-CM | POA: Diagnosis not present

## 2021-02-27 ENCOUNTER — Ambulatory Visit: Payer: BC Managed Care – PPO

## 2021-02-27 ENCOUNTER — Ambulatory Visit
Admission: RE | Admit: 2021-02-27 | Discharge: 2021-02-27 | Disposition: A | Discharge status: Home / Self Care | Payer: BC Managed Care – PPO | Source: Ambulatory Visit | Attending: Family Medicine | Admitting: Family Medicine

## 2021-02-27 DIAGNOSIS — Z1231 Encounter for screening mammogram for malignant neoplasm of breast: Secondary | ICD-10-CM | POA: Diagnosis not present

## 2021-03-03 ENCOUNTER — Other Ambulatory Visit: Payer: Self-pay | Admitting: Family Medicine

## 2021-04-03 DIAGNOSIS — Z01118 Encounter for examination of ears and hearing with other abnormal findings: Secondary | ICD-10-CM | POA: Diagnosis not present

## 2021-04-03 DIAGNOSIS — H903 Sensorineural hearing loss, bilateral: Secondary | ICD-10-CM | POA: Diagnosis not present

## 2021-04-03 DIAGNOSIS — Z974 Presence of external hearing-aid: Secondary | ICD-10-CM | POA: Diagnosis not present

## 2021-04-04 DIAGNOSIS — F411 Generalized anxiety disorder: Secondary | ICD-10-CM | POA: Diagnosis not present

## 2021-04-13 DIAGNOSIS — F411 Generalized anxiety disorder: Secondary | ICD-10-CM | POA: Diagnosis not present

## 2021-04-20 ENCOUNTER — Encounter: Payer: Self-pay | Admitting: Family Medicine

## 2021-04-20 DIAGNOSIS — J309 Allergic rhinitis, unspecified: Secondary | ICD-10-CM

## 2021-04-20 DIAGNOSIS — E559 Vitamin D deficiency, unspecified: Secondary | ICD-10-CM

## 2021-04-20 DIAGNOSIS — E876 Hypokalemia: Secondary | ICD-10-CM

## 2021-04-20 DIAGNOSIS — R739 Hyperglycemia, unspecified: Secondary | ICD-10-CM

## 2021-04-20 DIAGNOSIS — E785 Hyperlipidemia, unspecified: Secondary | ICD-10-CM

## 2021-04-21 NOTE — Telephone Encounter (Signed)
Please advise 

## 2021-04-26 ENCOUNTER — Other Ambulatory Visit (INDEPENDENT_AMBULATORY_CARE_PROVIDER_SITE_OTHER): Payer: BC Managed Care – PPO

## 2021-04-26 DIAGNOSIS — E559 Vitamin D deficiency, unspecified: Secondary | ICD-10-CM

## 2021-04-26 DIAGNOSIS — J309 Allergic rhinitis, unspecified: Secondary | ICD-10-CM

## 2021-04-26 DIAGNOSIS — E785 Hyperlipidemia, unspecified: Secondary | ICD-10-CM

## 2021-04-26 DIAGNOSIS — R739 Hyperglycemia, unspecified: Secondary | ICD-10-CM

## 2021-04-26 DIAGNOSIS — E876 Hypokalemia: Secondary | ICD-10-CM

## 2021-04-26 LAB — COMPREHENSIVE METABOLIC PANEL
ALT: 16 U/L (ref 0–35)
AST: 19 U/L (ref 0–37)
Albumin: 4.4 g/dL (ref 3.5–5.2)
Alkaline Phosphatase: 76 U/L (ref 39–117)
BUN: 14 mg/dL (ref 6–23)
CO2: 30 mEq/L (ref 19–32)
Calcium: 9.4 mg/dL (ref 8.4–10.5)
Chloride: 99 mEq/L (ref 96–112)
Creatinine, Ser: 0.94 mg/dL (ref 0.40–1.20)
GFR: 66.28 mL/min (ref >60.00)
Glucose, Bld: 85 mg/dL (ref 70–99)
Potassium: 3.5 mEq/L (ref 3.5–5.1)
Sodium: 138 mEq/L (ref 135–145)
Total Bilirubin: 0.5 mg/dL (ref 0.2–1.2)
Total Protein: 7.1 g/dL (ref 6.0–8.3)

## 2021-04-26 LAB — CBC WITH DIFFERENTIAL/PLATELET
Basophils Absolute: 0 10*3/uL (ref 0.0–0.1)
Basophils Relative: 0.8 % (ref 0.0–3.0)
Eosinophils Absolute: 0.2 10*3/uL (ref 0.0–0.7)
Eosinophils Relative: 3.8 % (ref 0.0–5.0)
HCT: 39.8 % (ref 36.0–46.0)
Hemoglobin: 13.6 g/dL (ref 12.0–15.0)
Lymphocytes Relative: 40.4 % (ref 12.0–46.0)
Lymphs Abs: 2.2 10*3/uL (ref 0.7–4.0)
MCHC: 34.2 g/dL (ref 30.0–36.0)
MCV: 90.8 fl (ref 78.0–100.0)
Monocytes Absolute: 0.5 10*3/uL (ref 0.1–1.0)
Monocytes Relative: 8.3 % (ref 3.0–12.0)
Neutro Abs: 2.6 10*3/uL (ref 1.4–7.7)
Neutrophils Relative %: 46.7 % (ref 43.0–77.0)
Platelets: 282 10*3/uL (ref 150.0–400.0)
RBC: 4.38 Mil/uL (ref 3.87–5.11)
RDW: 13.9 % (ref 11.5–15.5)
WBC: 5.5 10*3/uL (ref 4.0–10.5)

## 2021-04-26 LAB — LIPID PANEL
Cholesterol: 322 mg/dL — ABNORMAL HIGH (ref 0–200)
HDL: 54.6 mg/dL (ref >39.00)
LDL Cholesterol: 229 mg/dL — ABNORMAL HIGH (ref 0–99)
NonHDL: 267.44
Total CHOL/HDL Ratio: 6
Triglycerides: 190 mg/dL — ABNORMAL HIGH (ref 0.0–149.0)
VLDL: 38 mg/dL (ref 0.0–40.0)

## 2021-04-26 LAB — VITAMIN D 25 HYDROXY (VIT D DEFICIENCY, FRACTURES): VITD: 26.1 ng/mL — ABNORMAL LOW (ref 30.00–100.00)

## 2021-04-26 LAB — MAGNESIUM: Magnesium: 2.2 mg/dL (ref 1.5–2.5)

## 2021-04-26 LAB — HEMOGLOBIN A1C: Hgb A1c MFr Bld: 5.9 % (ref 4.6–6.5)

## 2021-05-03 ENCOUNTER — Telehealth: Payer: Self-pay | Admitting: Family Medicine

## 2021-05-03 NOTE — Telephone Encounter (Signed)
See results note. 

## 2021-05-03 NOTE — Telephone Encounter (Addendum)
Pt is returning joann concerning blood work results. Pt callback and would like a call on her mobile number ?

## 2021-05-10 MED ORDER — SIMVASTATIN 40 MG PO TABS: ORAL_TABLET | ORAL | 1 refills | Status: DC

## 2021-05-19 ENCOUNTER — Encounter: Payer: Self-pay | Admitting: Family Medicine

## 2021-05-19 ENCOUNTER — Ambulatory Visit (INDEPENDENT_AMBULATORY_CARE_PROVIDER_SITE_OTHER): Payer: BC Managed Care – PPO | Admitting: Family Medicine

## 2021-05-19 VITALS — BP 110/70 | HR 75 | Temp 98.4°F | Ht 69.0 in | Wt 198.4 lb

## 2021-05-19 DIAGNOSIS — R682 Dry mouth, unspecified: Secondary | ICD-10-CM

## 2021-05-19 DIAGNOSIS — R002 Palpitations: Secondary | ICD-10-CM

## 2021-05-19 LAB — BASIC METABOLIC PANEL
BUN: 16 mg/dL (ref 6–23)
CO2: 31 mEq/L (ref 19–32)
Calcium: 10.1 mg/dL (ref 8.4–10.5)
Chloride: 100 mEq/L (ref 96–112)
Creatinine, Ser: 0.99 mg/dL (ref 0.40–1.20)
GFR: 62.26 mL/min (ref >60.00)
Glucose, Bld: 90 mg/dL (ref 70–99)
Potassium: 3.8 mEq/L (ref 3.5–5.1)
Sodium: 140 mEq/L (ref 135–145)

## 2021-05-19 LAB — FOLATE: Folate: 19.3 ng/mL (ref >5.9)

## 2021-05-19 LAB — VITAMIN B12: Vitamin B-12: 272 pg/mL (ref 211–911)

## 2021-05-19 LAB — FERRITIN: Ferritin: 48.6 ng/mL (ref 10.0–291.0)

## 2021-05-19 LAB — TSH: TSH: 1.17 u[IU]/mL (ref 0.35–5.50)

## 2021-05-19 NOTE — Progress Notes (Signed)
?Heather Chen ?DOB: 04-18-1960 ?Encounter date: 05/19/2021 ? ?This is a 61 y.o. female who presents with ?Chief Complaint  ?Patient presents with  ? Referral  ?  Dry mouth  ? ? ?History of present illness: ? ?Still having the dry mouth. When she went to dentist they did panoramic but this looked ok.then stated he thought it was allergy. Has been on allergy medicine since then. Told her that allergies change.  ? ?Started while she was walking outside -then suddenly got swelling inside left lower lip.could see for awhile, but when down, then started with the dry mouth. She stopped simvastatin x 2 weeks, but this didn't help. Then she started taking the lipitor but didn't tolerate. She changed toothpaste. She had stopped drinking (more weight loss motivation) and did restart this around the time of symptoms. She hasn't had a drink in a week. (At max would be 5 drinks in weeks time). No improvement in last week.  ? ?Also stopped taking the valcyclovir. She has only had one outbreak so she has decided to stop taking unless needed for flares. Seemed better in a couple of days, but then worse again.  ? ?She did switch to sodium fluoride per dentist recommendation, but no change.  ? ?Joint pain has been pretty stable. Just back pain still present.  ? ?Still training once or twice a week and hasn't had to change that. Pain was bad with the lipitor and constipation was bad. ? ?Taste seems normal. Does have metallic/salty taste in mouth though. Husband has noted that she has been snoring. Does feel like sinuses are more dry. Does feel dry in general - skin, eyes, hair - but this has been baseline for her.  ? ?SUPPLEMENTS: none ? ?She does take in a lot of fluids with potassium. Started this with the diuretic.  ? ?Noting sensation of fluttering in chest. Has been going on for a few years. Notes when laying down at night to go to sleep. Quick flutter and then goes back to normal after a few seconds. No other sx  along with this. Occasionally will get irregular heart beat when checking bp. Last night was first time in a long time that she felt this. ? ?Allergies  ?Allergen Reactions  ? Lipitor [Atorvastatin]   ?  Constipation, low back pain  ? Nickel   ?  Other reaction(s): Other (See Comments)  ? Other   ?  Nickel and formaldehyde causes rash  ? Formaldehyde Rash  ? Neomycin Rash  ?  rash  ? ?Current Meds  ?Medication Sig  ? Cetirizine HCl (ZYRTEC PO) Take 10 mg by mouth daily.   ? Cholecalciferol (VITAMIN D3) 50 MCG (2000 UT) capsule Take 2,000 Units by mouth daily.  ? diphenhydrAMINE HCl (BENADRYL PO) Take by mouth at bedtime.  ? montelukast (SINGULAIR) 10 MG tablet Take 10 mg by mouth as needed.  ? simvastatin (ZOCOR) 40 MG tablet TAKE 1 TABLET BY MOUTH EVERYDAY AT BEDTIME  ? [DISCONTINUED] valACYclovir (VALTREX) 500 MG tablet TAKE 1 TABLET BY MOUTH EVERY DAY FOR 5 DAYS  ? ? ?Review of Systems  ?Constitutional:  Negative for chills, fatigue and fever.  ?HENT:  Positive for congestion. Negative for ear pain, mouth sores, sinus pain and sore throat. Sinus pressure: just with allergy flares.  ?     See hpi  ?Respiratory:  Negative for cough, chest tightness, shortness of breath and wheezing.   ?Cardiovascular:  Positive for palpitations. Negative for chest pain and  leg swelling.  ? ?Objective: ? ?BP 110/70   Pulse 75   Temp 98.4 ?F (36.9 ?C) (Oral)   Ht 5\' 9"  (1.753 m)   Wt 198 lb 7 oz (90 kg)   LMP 09/09/2014   SpO2 97%   BMI 29.30 kg/m?   Weight: 198 lb 7 oz (90 kg)  ? ?BP Readings from Last 3 Encounters:  ?05/19/21 110/70  ?12/26/20 130/74  ?11/23/20 98/68  ? ?Wt Readings from Last 3 Encounters:  ?05/19/21 198 lb 7 oz (90 kg)  ?12/26/20 203 lb 4 oz (92.2 kg)  ?11/23/20 201 lb 12.8 oz (91.5 kg)  ? ? ?Physical Exam ?Constitutional:   ?   General: She is not in acute distress. ?   Appearance: She is well-developed.  ?Cardiovascular:  ?   Rate and Rhythm: Normal rate and regular rhythm.  ?   Heart sounds: Normal heart  sounds. No murmur heard. ?  No friction rub.  ?Pulmonary:  ?   Effort: Pulmonary effort is normal. No respiratory distress.  ?   Breath sounds: Normal breath sounds. No wheezing or rales.  ?Musculoskeletal:  ?   Right lower leg: No edema.  ?   Left lower leg: No edema.  ?Neurological:  ?   Mental Status: She is alert and oriented to person, place, and time.  ?Psychiatric:     ?   Behavior: Behavior normal.  ? ? ?Assessment/Plan ? ?1. Dry mouth ?We will get some additional bloodwork today. Consider specialty eval pending results. We discussed avoiding foods that flare or irritate mouth.  ?- Sjogren's syndrome antibods(ssa + ssb) ?- Basic metabolic panel; Future ?- Vitamin B12 ?- Folate; Future ?- Zinc ?- Folate ?- Basic metabolic panel ? ?2. Palpitations ?Occasional, brief. Have been present for a couple of years. If increasing could get even monitor to capture. Ekg last completed 11/2019 was stable without acute findings.  ?- Ferritin; Future ?- TSH; Future ?- TSH ?- Ferritin ? ? ?Return for pending lab or imaging results. ? ? ?45 minutes spent in chart review, time with patient, discussion of sx and follow up eval plan, exam, charting. ? ? ?Micheline Rough, MD ?

## 2021-05-20 ENCOUNTER — Encounter: Payer: Self-pay | Admitting: Family Medicine

## 2021-05-22 LAB — SJOGREN'S SYNDROME ANTIBODS(SSA + SSB)
SSA (Ro) (ENA) Antibody, IgG: <1 AI
SSB (La) (ENA) Antibody, IgG: <1 AI

## 2021-05-22 LAB — ZINC: Zinc: 68 ug/dL (ref 60–130)

## 2021-05-25 ENCOUNTER — Telehealth: Payer: Self-pay | Admitting: Family Medicine

## 2021-05-25 DIAGNOSIS — F411 Generalized anxiety disorder: Secondary | ICD-10-CM | POA: Diagnosis not present

## 2021-05-25 NOTE — Telephone Encounter (Signed)
Pt requesting the potassium supplement that she states was discussed at her appointment last week.

## 2021-05-26 ENCOUNTER — Other Ambulatory Visit: Payer: Self-pay | Admitting: Family Medicine

## 2021-05-26 MED ORDER — POTASSIUM CHLORIDE CRYS ER 10 MEQ PO TBCR: 10.0000 meq | EXTENDED_RELEASE_TABLET | Freq: Every day | ORAL | 1 refills | Status: DC

## 2021-05-26 NOTE — Telephone Encounter (Signed)
Potassium sent in; but I would advise a recheck bmp in 2-3 months on this to make sure we are not overcorrecting. This can be done with her new provider.

## 2021-05-26 NOTE — Telephone Encounter (Signed)
Patient informed of the message below and was advised to schedule a transfer of care visit and have labs ordered by the provider of her choice.

## 2021-06-08 DIAGNOSIS — F411 Generalized anxiety disorder: Secondary | ICD-10-CM | POA: Diagnosis not present

## 2021-07-17 DIAGNOSIS — F411 Generalized anxiety disorder: Secondary | ICD-10-CM | POA: Diagnosis not present

## 2021-07-31 ENCOUNTER — Encounter (INDEPENDENT_AMBULATORY_CARE_PROVIDER_SITE_OTHER): Payer: BC Managed Care – PPO | Admitting: Ophthalmology

## 2021-07-31 ENCOUNTER — Telehealth: Payer: Self-pay | Admitting: *Deleted

## 2021-07-31 NOTE — Telephone Encounter (Signed)
[facility] faxed a refill request for Valacyclovir 500mg -take 1 tablet by mouth every day for 5 days-#90.  Message sent to Dr .

## 2021-08-01 DIAGNOSIS — Z01419 Encounter for gynecological examination (general) (routine) without abnormal findings: Secondary | ICD-10-CM | POA: Diagnosis not present

## 2021-08-01 NOTE — Telephone Encounter (Signed)
She has an appointment with a Dr. Rondel Jumbo-- is she planning on coming to see me as a patient or transferring her care to another PCP?

## 2021-08-01 NOTE — Telephone Encounter (Signed)
Spoke with the patient, she stated she will be transferring care to Dr Ruthine Dose and will request refills during the visit on 8/9.

## 2021-08-02 ENCOUNTER — Ambulatory Visit (INDEPENDENT_AMBULATORY_CARE_PROVIDER_SITE_OTHER): Payer: BC Managed Care – PPO | Admitting: Ophthalmology

## 2021-08-02 ENCOUNTER — Encounter (INDEPENDENT_AMBULATORY_CARE_PROVIDER_SITE_OTHER): Payer: Self-pay | Admitting: Ophthalmology

## 2021-08-02 DIAGNOSIS — H43812 Vitreous degeneration, left eye: Secondary | ICD-10-CM

## 2021-08-02 DIAGNOSIS — H35371 Puckering of macula, right eye: Secondary | ICD-10-CM

## 2021-08-02 DIAGNOSIS — H43391 Other vitreous opacities, right eye: Secondary | ICD-10-CM | POA: Diagnosis not present

## 2021-08-02 DIAGNOSIS — H43811 Vitreous degeneration, right eye: Secondary | ICD-10-CM | POA: Diagnosis not present

## 2021-08-02 DIAGNOSIS — H2513 Age-related nuclear cataract, bilateral: Secondary | ICD-10-CM

## 2021-08-02 NOTE — Assessment & Plan Note (Signed)
Minor and follow-up Dr. Velna Ochs

## 2021-08-02 NOTE — Assessment & Plan Note (Signed)
Also stable no holes

## 2021-08-02 NOTE — Assessment & Plan Note (Signed)
Minor OD.  Observe

## 2021-08-02 NOTE — Progress Notes (Signed)
08/02/2021     CHIEF COMPLAINT Patient presents for No chief complaint on file.     HISTORY OF PRESENT ILLNESS: Heather Chen is a 61 y.o. female who presents to the clinic today for:   HPI   1 year fu oct  Pt states her vision has been stable  Pt denies any new floaters or FOL  Last edited by Aleene Davidson, CMA on 08/02/2021  8:15 AM.      Referring physician: Marzella Schlein., MD [address] [place],  [place] [address]  HISTORICAL INFORMATION:   Selected notes from the MEDICAL RECORD NUMBER    Lab Results  Component Value Date   HGBA1C 5.9 04/26/2021     CURRENT MEDICATIONS: No current outpatient medications on file. (Ophthalmic Drugs)   No current facility-administered medications for this visit. (Ophthalmic Drugs)   Current Outpatient Medications (Other)  Medication Sig   Cetirizine HCl (ZYRTEC PO) Take 10 mg by mouth daily.    Cholecalciferol (VITAMIN D3) 50 MCG (2000 UT) capsule Take 2,000 Units by mouth daily.   diphenhydrAMINE HCl (BENADRYL PO) Take by mouth at bedtime.   montelukast (SINGULAIR) 10 MG tablet Take 10 mg by mouth as needed.   potassium chloride (KLOR-CON M) 10 MEQ tablet Take 1 tablet (10 mEq total) by mouth daily.   simvastatin (ZOCOR) 40 MG tablet TAKE 1 TABLET BY MOUTH EVERYDAY AT BEDTIME   triamterene-hydrochlorothiazide (DYAZIDE) 37.5-25 MG capsule Take by mouth daily.    No current facility-administered medications for this visit. (Other)      REVIEW OF SYSTEMS: ROS   Negative for: Constitutional, Gastrointestinal, Neurological, Skin, Genitourinary, Musculoskeletal, HENT, Endocrine, Cardiovascular, Eyes, Respiratory, Psychiatric, Allergic/Imm, Heme/Lymph Last edited by Aleene Davidson, CMA on 08/02/2021  8:15 AM.       ALLERGIES Allergies  Allergen Reactions   Lipitor [Atorvastatin]     Constipation, low back pain   Nickel     Other reaction(s): Other (See Comments)   Other      Nickel and formaldehyde causes rash   Formaldehyde Rash   Neomycin Rash    rash    PAST MEDICAL HISTORY Past Medical History:  Diagnosis Date   Abdominal migraine, not intractable 05/30/2015   Allergy    SEASONAL   Anemia    GERD (gastroesophageal reflux disease)    H/O seasonal allergies    Heart murmur    AS A CHILD   Hyperlipidemia    Migraine headache    Tennis elbow    Past Surgical History:  Procedure Laterality Date   BREAST BIOPSY Right 1994   benign   BREAST CYST EXCISION Right    20 years ago   COLONOSCOPY      FAMILY HISTORY Family History  Problem Relation Age of Onset   Breast cancer Mother 40   Osteoarthritis Mother 53   Rheum arthritis Mother 50       psoriatic arthritis, osteoarthritis   Lymphoma Mother    Dementia Mother    Hearing loss Mother    Leukemia Father 9       deceased   Arthritis Father    Colon polyps Sister    Rheum arthritis Sister    Heart disease Paternal Grandmother    Breast cancer Maternal Grandmother    Breast cancer Maternal Aunt        x 2   Alcohol abuse Brother    Rheum arthritis Sister     SOCIAL HISTORY  Social History   Tobacco Use   Smoking status: Former    Years: 10.00    Types: Cigarettes    Quit date: 01/09/1995    Years since quitting: 26.5   Smokeless tobacco: Never  Substance Use Topics   Alcohol use: Yes    Alcohol/week: 5.0 standard drinks of alcohol    Types: 5 Cans of beer per week   Drug use: No         OPHTHALMIC EXAM:  Base Eye Exam     Visual Acuity (Snellen - Linear)       Right Left   Dist [place] 20/20 20/20         Tonometry (Tonopen, 8:20 AM)       Right Left   Pressure 5 5         Pupils       Pupils Shape APD   Right PERRL Round None   Left PERRL           Visual Fields       Left Right    Full Full         Extraocular Movement       Right Left    Full, Ortho Full, Ortho         Neuro/Psych     Oriented x3: Yes   Mood/Affect: Normal            Slit Lamp and Fundus Exam     External Exam       Right Left   External Normal Normal         Slit Lamp Exam       Right Left   Lids/Lashes Normal Normal   Conjunctiva/Sclera White and quiet White and quiet   Cornea Clear Clear   Anterior Chamber Deep and quiet Deep and quiet   Iris Round and reactive Round and reactive   Lens 1+ Nuclear sclerosis 1+ Nuclear sclerosis   Anterior Vitreous Normal Normal         Fundus Exam       Right Left   Posterior Vitreous Posterior vitreous detachment, Central vitreous floaters Posterior vitreous detachment, Central vitreous floaters   Disc Peripapillary atrophy Peripapillary atrophy   C/D Ratio 0.25 0.3   Macula Normal Normal   Vessels Normal Normal   Periphery 20D, 28D lens to the ora, scleral depressed exam, attached 360 degrees, no holes or tears Normal, no holes or tears            IMAGING AND PROCEDURES  Imaging and Procedures for 08/02/21  OCT, Retina - OU - Both Eyes       Right Eye Quality was poor. Scan locations included subfoveal. Central Foveal Thickness: 279. Progression has been stable. Findings include normal observations, normal foveal contour, epiretinal membrane.   Left Eye Quality was poor. Scan locations included subfoveal. Central Foveal Thickness: 264. Progression has been stable. Findings include normal observations, normal foveal contour.   Notes Vitreous detachment OD, with vitreous debris and floaters and and a retinal shadowing OD.  Vitreous debris, no change over time.  With minor epiretinal membrane nasal aspect of the macula, with absolutely no topographic distortion no changes  Normal contour OU    OS with prior posterior vitreous detachment             ASSESSMENT/PLAN:  Right epiretinal membrane Minor OD.  Observe  Vitreous floaters, right No change over time no impact on acuity  Posterior vitreous detachment of right eye  Stable  Posterior vitreous  detachment of left eye Also stable no holes  Nuclear sclerotic cataract of both eyes Minor and follow-up Dr. Velna Ochs     ICD-10-CM   1. Nuclear sclerotic cataract of both eyes  H25.13 OCT, Retina - OU - Both Eyes    2. Right epiretinal membrane  H35.371     3. Vitreous floaters, right  H43.391     4. Posterior vitreous detachment of right eye  H43.811     5. Posterior vitreous detachment of left eye  H43.812       1.  PVD OU.  No retinal holes or tears careful dilated examination.  2.  Mild NSC changes.  3.  Follow-up Dr. Velna Ochs and here as needed basis.  Ophthalmic Meds Ordered this visit:  No orders of the defined types were placed in this encounter.      Return if symptoms worsen or fail to improve, for And as scheduled with Dr. Lucretia Roers.  Patient Instructions  Signs symptoms of retinal detachment reviewed   Explained the diagnoses, plan, and follow up with the patient and they expressed understanding.  Patient expressed understanding of the importance of proper follow up care.   Alford Highland Elaria Osias M.D. Diseases & Surgery of the Retina and Vitreous [facility] 08/02/21     Abbreviations: M myopia (nearsighted); A astigmatism; H hyperopia (farsighted); P presbyopia; Mrx spectacle prescription;  CTL contact lenses; OD right eye; OS left eye; OU both eyes  XT exotropia; ET esotropia; PEK punctate epithelial keratitis; PEE punctate epithelial erosions; DES dry eye syndrome; MGD meibomian gland dysfunction; ATs artificial tears; PFAT's preservative free artificial tears; NSC nuclear sclerotic cataract; PSC posterior subcapsular cataract; ERM epi-retinal membrane; PVD posterior vitreous detachment; RD retinal detachment; DM diabetes mellitus; DR diabetic retinopathy; NPDR non-proliferative diabetic retinopathy; PDR proliferative diabetic retinopathy; CSME clinically significant macular edema; DME diabetic macular edema; dbh dot blot hemorrhages; CWS cotton  wool spot; POAG primary open angle glaucoma; C/D cup-to-disc ratio; HVF humphrey visual field; GVF goldmann visual field; OCT optical coherence tomography; IOP intraocular pressure; BRVO Branch retinal vein occlusion; CRVO central retinal vein occlusion; CRAO central retinal artery occlusion; BRAO branch retinal artery occlusion; RT retinal tear; SB scleral buckle; PPV pars plana vitrectomy; VH Vitreous hemorrhage; PRP panretinal laser photocoagulation; IVK intravitreal kenalog; VMT vitreomacular traction; MH Macular hole;  NVD neovascularization of the disc; NVE neovascularization elsewhere; AREDS age related eye disease study; ARMD age related macular degeneration; POAG primary open angle glaucoma; EBMD epithelial/anterior basement membrane dystrophy; ACIOL anterior chamber intraocular lens; IOL intraocular lens; PCIOL posterior chamber intraocular lens; Phaco/IOL phacoemulsification with intraocular lens placement; PRK photorefractive keratectomy; LASIK laser assisted in situ keratomileusis; HTN hypertension; DM diabetes mellitus; COPD chronic obstructive pulmonary disease

## 2021-08-02 NOTE — Assessment & Plan Note (Signed)
Stable

## 2021-08-02 NOTE — Patient Instructions (Signed)
Signs symptoms of retinal detachment reviewed

## 2021-08-02 NOTE — Assessment & Plan Note (Signed)
No change over time no impact on acuity

## 2021-08-03 DIAGNOSIS — F411 Generalized anxiety disorder: Secondary | ICD-10-CM | POA: Diagnosis not present

## 2021-08-16 ENCOUNTER — Encounter: Payer: Self-pay | Admitting: Family Medicine

## 2021-08-16 ENCOUNTER — Ambulatory Visit (INDEPENDENT_AMBULATORY_CARE_PROVIDER_SITE_OTHER): Payer: BC Managed Care – PPO | Admitting: Family Medicine

## 2021-08-16 VITALS — BP 120/80 | HR 76 | Temp 98.0°F | Ht 69.0 in | Wt 202.4 lb

## 2021-08-16 DIAGNOSIS — R7303 Prediabetes: Secondary | ICD-10-CM | POA: Insufficient documentation

## 2021-08-16 DIAGNOSIS — E7800 Pure hypercholesterolemia, unspecified: Secondary | ICD-10-CM | POA: Diagnosis not present

## 2021-08-16 DIAGNOSIS — L989 Disorder of the skin and subcutaneous tissue, unspecified: Secondary | ICD-10-CM | POA: Diagnosis not present

## 2021-08-16 DIAGNOSIS — H8103 Meniere's disease, bilateral: Secondary | ICD-10-CM

## 2021-08-16 DIAGNOSIS — R682 Dry mouth, unspecified: Secondary | ICD-10-CM

## 2021-08-16 DIAGNOSIS — B009 Herpesviral infection, unspecified: Secondary | ICD-10-CM

## 2021-08-16 LAB — BASIC METABOLIC PANEL
BUN: 20 mg/dL (ref 6–23)
CO2: 32 mEq/L (ref 19–32)
Calcium: 9.7 mg/dL (ref 8.4–10.5)
Chloride: 99 mEq/L (ref 96–112)
Creatinine, Ser: 1.02 mg/dL (ref 0.40–1.20)
GFR: 59.96 mL/min — ABNORMAL LOW (ref >60.00)
Glucose, Bld: 91 mg/dL (ref 70–99)
Potassium: 4 mEq/L (ref 3.5–5.1)
Sodium: 141 mEq/L (ref 135–145)

## 2021-08-16 MED ORDER — VALACYCLOVIR HCL 500 MG PO TABS: 500.0000 mg | ORAL_TABLET | Freq: Every day | ORAL | 3 refills | Status: DC

## 2021-08-16 NOTE — Patient Instructions (Signed)
It was very nice to see you today!  Work on diet/exercise   PLEASE NOTE:  If you had any lab tests please let us know if you have not heard back within a few days. You may see your results on MyChart before we have a chance to review them but we will give you a call once they are reviewed by us. If we ordered any referrals today, please let us know if you have not heard from their office within the next week.   Please try these tips to maintain a healthy lifestyle:  Eat most of your calories during the day when you are active. Eliminate processed foods including packaged sweets (pies, cakes, cookies), reduce intake of potatoes, white bread, white pasta, and white rice. Look for whole grain options, oat flour or almond flour.  Each meal should contain half fruits/vegetables, one quarter protein, and one quarter carbs (no bigger than a computer mouse).  Cut down on sweet beverages. This includes juice, soda, and sweet tea. Also watch fruit intake, though this is a healthier sweet option, it still contains natural sugar! Limit to 3 servings daily.  Drink at least 1 glass of water with each meal and aim for at least 8 glasses per day  Exercise at least 150 minutes every week.   

## 2021-08-16 NOTE — Progress Notes (Signed)
Subjective:     Patient ID: Gennette Pac Dan Europe, female    DOB: 1960-05-05, 61 y.o.   MRN: [id number]  Chief Complaint  Patient presents with   Transitions Of Care   blood work    Potassium and lipids    HPI TOC from Dr. Gray Bernhardt mouth-stopped mult meds to see if cause, etc.  Saw DDS.  Started taking multivitamin as D a little low and dry mouth cleared.  HLD-familial-LDL >200.  Calcium score 0.  Back on simvastatin as atorvastatin caused SE Menier's-on dyazide and K and needs to reck as low.  Bp can be low-88/55 but no symptoms. Will be low for several days and then increase.  PreDM-exercises.  Good diet but does eat too many sweets.  HSV 2-valtrex intermitt  bid 500mg  x 5d.  Was on suppressive therapy at 1 point, however, she did stop this thinking maybe it was contributing to her dry mouth.  Is under a lot of stress.  Mom just fell and broke her pelvis.  Patient has had a recent outbreak.  Health Maintenance Due  Topic Date Due   INFLUENZA VACCINE  08/08/2021    Past Medical History:  Diagnosis Date   Abdominal migraine, not intractable 05/30/2015   Allergy    SEASONAL   Anemia    GERD (gastroesophageal reflux disease)    H/O seasonal allergies    Heart murmur    AS A CHILD   Hyperlipidemia    Meniere disease, bilateral    Migraine headache    Prediabetes    Tennis elbow     Past Surgical History:  Procedure Laterality Date   BREAST BIOPSY Right 1994   benign   BREAST CYST EXCISION Right    20 years ago   COLONOSCOPY     MENISCUS DEBRIDEMENT Right 2021    Outpatient Medications Prior to Visit  Medication Sig Dispense Refill   Cetirizine HCl (ZYRTEC PO) Take 10 mg by mouth daily.      Cholecalciferol (VITAMIN D3) 50 MCG (2000 UT) capsule Take 2,000 Units by mouth daily.     diphenhydrAMINE HCl (BENADRYL PO) Take by mouth at bedtime.     montelukast (SINGULAIR) 10 MG tablet Take 10 mg by mouth as needed.     potassium chloride (KLOR-CON M)  10 MEQ tablet Take 1 tablet (10 mEq total) by mouth daily. 90 tablet 1   simvastatin (ZOCOR) 40 MG tablet TAKE 1 TABLET BY MOUTH EVERYDAY AT BEDTIME 90 tablet 1   triamterene-hydrochlorothiazide (DYAZIDE) 37.5-25 MG capsule Take by mouth daily.      No facility-administered medications prior to visit.    Allergies  Allergen Reactions   Lipitor [Atorvastatin]     Constipation, low back pain   Nickel     Other reaction(s): Other (See Comments)   Other     Nickel and formaldehyde causes rash   Formaldehyde Rash   Neomycin Rash    rash   ROS neg/noncontributory except as noted HPI/below  Lesion R second toe-not sure how long there.  No pain.      Objective:     BP 120/80   Pulse 76   Temp 98 F (36.7 C) (Temporal)   Ht 5\' 9"  (1.753 m)   Wt 202 lb 6.4 oz (91.8 kg)   LMP 09/09/2014   SpO2 98%   BMI 29.89 kg/m  Wt Readings from Last 3 Encounters:  08/16/21 202 lb 6.4 oz (91.8 kg)  05/19/21 198 lb  7 oz (90 kg)  12/26/20 203 lb 4 oz (92.2 kg)    Physical Exam   Gen: WDWN NAD wf HEENT: NCAT, conjunctiva not injected, sclera nonicteric NECK:  supple, no thyromegaly, no nodes, no carotid bruits CARDIAC: RRR, S1S2+, no murmur. DP 2+B LUNGS: CTAB. No wheezes ABDOMEN:  BS+, soft, NTND, No HSM, no masses EXT:  no edema.  R 2nd toe-few mm cyst on dorsum lateral side DIP area.   MSK: no gross abnormalities.  NEURO: A&O x3.  CN II-XII intact.  PSYCH: normal mood. Good eye contact  Reviewed labs and notes.     Assessment & Plan:   Problem List Items Addressed This Visit       Nervous and Auditory   Meniere's disease of both ears - Primary   Relevant Orders   Basic Metabolic Panel (BMET) (Completed)     Other   Hyperlipemia   Prediabetes   Other Visit Diagnoses     Skin lesion       Dry mouth       HSV-2 (herpes simplex virus 2) infection       Relevant Medications   valACYclovir (VALTREX) 500 MG tablet     1.  Dry mouth-has had extensive workup.   Sjogren's negative.  Has stopped different medications, changed toothpaste's, seeing dentist.  Started on multivitamin and has resolved.  Continue 2.  Mnire's disease-chronic.  Controlled on Dyazide 37.5/25 mg.  Having to take potassium 10 mEq daily as potassium has been low.  Check BMP 3.  Hyperlipidemia-mostly familial.  Chronic.  Controlled on simvastatin 40 mg daily.  Follow-up for annual physical in November and we will recheck levels.  Intolerant of atorvastatin and cholesterol went way up when off of medicines.  Continue working on diet/exercise 4.  Prediabetes-aware of risk of diabetes.  Patient exercises.  She has a decent diet, however strong sweet tooth.  She will work on decreasing her sugars and starches.  Check A1c at physical 5.  HSV-chronic.  Controlled on daily suppressive therapy.  She stopped daily treatment.  Has had an outbreak.  We discussed the pros and cons of as needed treatment versus suppressive treatment.  Prescription written for Valtrex 500 mg daily.  She will decide if she wants to take suppressive or treat episodically. 6.  Skin lesion right second toe-not sure if blister versus cyst.  Patient is not sure how long it has been there.  It is not painful.  Monitor for the next 1 to 2 weeks and see if resolves.  Advised not to pop it.  Follow-up with podiatry if not resolving.  Follow-up for annual physical.  Meds ordered this encounter  Medications   valACYclovir (VALTREX) 500 MG tablet    Sig: Take 1 tablet (500 mg total) by mouth daily.    Dispense:  90 tablet    Refill:  3    Angelena Sole, MD

## 2021-08-17 DIAGNOSIS — F411 Generalized anxiety disorder: Secondary | ICD-10-CM | POA: Diagnosis not present

## 2021-08-30 DIAGNOSIS — F411 Generalized anxiety disorder: Secondary | ICD-10-CM | POA: Diagnosis not present

## 2021-09-06 DIAGNOSIS — F411 Generalized anxiety disorder: Secondary | ICD-10-CM | POA: Diagnosis not present

## 2021-09-14 DIAGNOSIS — F411 Generalized anxiety disorder: Secondary | ICD-10-CM | POA: Diagnosis not present

## 2021-09-20 DIAGNOSIS — L821 Other seborrheic keratosis: Secondary | ICD-10-CM | POA: Diagnosis not present

## 2021-09-20 DIAGNOSIS — L814 Other melanin hyperpigmentation: Secondary | ICD-10-CM | POA: Diagnosis not present

## 2021-09-20 DIAGNOSIS — L578 Other skin changes due to chronic exposure to nonionizing radiation: Secondary | ICD-10-CM | POA: Diagnosis not present

## 2021-09-20 DIAGNOSIS — D225 Melanocytic nevi of trunk: Secondary | ICD-10-CM | POA: Diagnosis not present

## 2021-09-21 DIAGNOSIS — Z974 Presence of external hearing-aid: Secondary | ICD-10-CM | POA: Diagnosis not present

## 2021-09-21 DIAGNOSIS — H903 Sensorineural hearing loss, bilateral: Secondary | ICD-10-CM | POA: Diagnosis not present

## 2021-09-21 DIAGNOSIS — H9313 Tinnitus, bilateral: Secondary | ICD-10-CM | POA: Diagnosis not present

## 2021-09-21 DIAGNOSIS — H8102 Meniere's disease, left ear: Secondary | ICD-10-CM | POA: Diagnosis not present

## 2021-09-25 DIAGNOSIS — F411 Generalized anxiety disorder: Secondary | ICD-10-CM | POA: Diagnosis not present

## 2021-10-11 DIAGNOSIS — F411 Generalized anxiety disorder: Secondary | ICD-10-CM | POA: Diagnosis not present

## 2021-10-23 DIAGNOSIS — F411 Generalized anxiety disorder: Secondary | ICD-10-CM | POA: Diagnosis not present

## 2021-11-13 DIAGNOSIS — F411 Generalized anxiety disorder: Secondary | ICD-10-CM | POA: Diagnosis not present

## 2021-11-24 ENCOUNTER — Encounter: Payer: Self-pay | Admitting: Family Medicine

## 2021-11-24 ENCOUNTER — Ambulatory Visit (INDEPENDENT_AMBULATORY_CARE_PROVIDER_SITE_OTHER): Payer: BC Managed Care – PPO | Admitting: Family Medicine

## 2021-11-24 VITALS — BP 114/70 | HR 70 | Temp 97.5°F | Ht 69.0 in | Wt 167.6 lb

## 2021-11-24 DIAGNOSIS — M545 Low back pain, unspecified: Secondary | ICD-10-CM | POA: Diagnosis not present

## 2021-11-24 DIAGNOSIS — Z0000 Encounter for general adult medical examination without abnormal findings: Secondary | ICD-10-CM

## 2021-11-24 DIAGNOSIS — G8929 Other chronic pain: Secondary | ICD-10-CM

## 2021-11-24 DIAGNOSIS — H8103 Meniere's disease, bilateral: Secondary | ICD-10-CM | POA: Diagnosis not present

## 2021-11-24 DIAGNOSIS — E7800 Pure hypercholesterolemia, unspecified: Secondary | ICD-10-CM

## 2021-11-24 DIAGNOSIS — R7303 Prediabetes: Secondary | ICD-10-CM

## 2021-11-24 LAB — TSH: TSH: 1.45 u[IU]/mL (ref 0.35–5.50)

## 2021-11-24 LAB — CBC WITH DIFFERENTIAL/PLATELET
Basophils Absolute: 0.1 10*3/uL (ref 0.0–0.1)
Basophils Relative: 1.1 % (ref 0.0–3.0)
Eosinophils Absolute: 0.2 10*3/uL (ref 0.0–0.7)
Eosinophils Relative: 4 % (ref 0.0–5.0)
HCT: 41.4 % (ref 36.0–46.0)
Hemoglobin: 13.6 g/dL (ref 12.0–15.0)
Lymphocytes Relative: 41 % (ref 12.0–46.0)
Lymphs Abs: 2.3 10*3/uL (ref 0.7–4.0)
MCHC: 32.7 g/dL (ref 30.0–36.0)
MCV: 92.1 fl (ref 78.0–100.0)
Monocytes Absolute: 0.4 10*3/uL (ref 0.1–1.0)
Monocytes Relative: 6.9 % (ref 3.0–12.0)
Neutro Abs: 2.6 10*3/uL (ref 1.4–7.7)
Neutrophils Relative %: 47 % (ref 43.0–77.0)
Platelets: 270 10*3/uL (ref 150.0–400.0)
RBC: 4.5 Mil/uL (ref 3.87–5.11)
RDW: 13.7 % (ref 11.5–15.5)
WBC: 5.6 10*3/uL (ref 4.0–10.5)

## 2021-11-24 LAB — POCT URINALYSIS DIPSTICK
Bilirubin, UA: NEGATIVE
Blood, UA: NEGATIVE
Glucose, UA: NEGATIVE
Ketones, UA: NEGATIVE
Leukocytes, UA: NEGATIVE
Nitrite, UA: NEGATIVE
Protein, UA: NEGATIVE
Spec Grav, UA: <=1.005 — AB (ref 1.010–1.025)
Urobilinogen, UA: 0.2 E.U./dL
pH, UA: 6.5 (ref 5.0–8.0)

## 2021-11-24 LAB — LIPID PANEL
Cholesterol: 190 mg/dL (ref 0–200)
HDL: 55.2 mg/dL (ref >39.00)
LDL Cholesterol: 112 mg/dL — ABNORMAL HIGH (ref 0–99)
NonHDL: 134.95
Total CHOL/HDL Ratio: 3
Triglycerides: 117 mg/dL (ref 0.0–149.0)
VLDL: 23.4 mg/dL (ref 0.0–40.0)

## 2021-11-24 LAB — COMPREHENSIVE METABOLIC PANEL
ALT: 19 U/L (ref 0–35)
AST: 21 U/L (ref 0–37)
Albumin: 4.6 g/dL (ref 3.5–5.2)
Alkaline Phosphatase: 71 U/L (ref 39–117)
BUN: 15 mg/dL (ref 6–23)
CO2: 31 mEq/L (ref 19–32)
Calcium: 9.4 mg/dL (ref 8.4–10.5)
Chloride: 101 mEq/L (ref 96–112)
Creatinine, Ser: 0.94 mg/dL (ref 0.40–1.20)
GFR: 66.01 mL/min (ref >60.00)
Glucose, Bld: 91 mg/dL (ref 70–99)
Potassium: 3.9 mEq/L (ref 3.5–5.1)
Sodium: 138 mEq/L (ref 135–145)
Total Bilirubin: 0.5 mg/dL (ref 0.2–1.2)
Total Protein: 6.9 g/dL (ref 6.0–8.3)

## 2021-11-24 LAB — HEMOGLOBIN A1C: Hgb A1c MFr Bld: 5.8 % (ref 4.6–6.5)

## 2021-11-24 NOTE — Patient Instructions (Signed)
It was very nice to see you today!  Referral to physical therapy Call GI for [phone] get X-ray/labs at [facility]  [address]  hours 8=M-F 8:30-5.  closed 12:30-1 lunch    PLEASE NOTE:  If you had any lab tests please let us know if you have not heard back within a few days. You may see your results on MyChart before we have a chance to review them but we will give you a call once they are reviewed by [place]. If we ordered any referrals today, please let us know if you have not heard from their office within the next week.   Please try these tips to maintain a healthy lifestyle:  Eat most of your calories during the day when you are active. Eliminate processed foods including packaged sweets (pies, cakes, cookies), reduce intake of potatoes, white bread, white pasta, and white rice. Look for whole grain options, oat flour or almond flour.  Each meal should contain half fruits/vegetables, one quarter protein, and one quarter carbs (no bigger than a computer mouse).  Cut down on sweet beverages. This includes juice, soda, and sweet tea. Also watch fruit intake, though this is a healthier sweet option, it still contains natural sugar! Limit to 3 servings daily.  Drink at least 1 glass of water with each meal and aim for at least 8 glasses per day  Exercise at least 150 minutes every week.

## 2021-11-24 NOTE — Progress Notes (Signed)
Phone [phone]   Subjective:   Patient is a 61 y.o. female presenting for annual physical.    Chief Complaint  Patient presents with   Annual Exam   Annual-cscope due in March-pt will call.  Exercises-gym, weights, cardio Menier's-on dyazide qod now.-saw ENT.  Hopefully can continue to wean the meds and stop K HLD-doing ok on zocor.  Lipitor caused severe constipation and back pain.  Still has some back pain PreDM-hasn't lost wt, but working on diet/exercise  See problem oriented charting- ROS- ROS: Gen: no fever, chills  Skin: some eczema and itchy skin.  On zyrtec for >72yrs. Doing ok.  Takes sinulair seasonally.  Thing on toe-derm said see Pod if bothering her ENT: no ear pain, ear drainage, nasal congestion, rhinorrhea, sinus pressure, sore throat +hearing loss-stable.  Wears Hearing aids Eyes: no blurry vision, double vision Resp: no cough, wheeze,SOB CV: no CP, palpitations, LE edema,  GI: no heartburn, n/v/d/c, abd pain GU: occ dysuria MSK: still w/LBP and hard to bend over and hard/pain to get up in am, but then improves. Stays active. FH OA. Some DJD on DXA.  Neuro: no dizziness, headache, weakness, vertigo Psych: no depression, anxiety, insomnia, SI    A lot of stress in life  The following were reviewed and entered/updated in epic: Past Medical History:  Diagnosis Date   Abdominal migraine, not intractable 05/30/2015   Allergy    SEASONAL   Anemia    GERD (gastroesophageal reflux disease)    H/O seasonal allergies    Heart murmur    AS A CHILD   Hyperlipidemia    Meniere disease, bilateral    Migraine headache    Prediabetes    Tennis elbow    Patient Active Problem List   Diagnosis Date Noted   Prediabetes 08/16/2021   Meniere's disease of both ears 12/26/2020   Right epiretinal membrane 07/27/2020   Hearing loss 11/23/2019   Posterior vitreous detachment of right eye 06/16/2019   Posterior vitreous detachment of left eye 06/16/2019   Nuclear  sclerotic cataract of both eyes 06/16/2019   Vitreous floaters, right 06/16/2019   Abdominal migraine, not intractable 05/30/2015   Hyperlipemia 08/20/2006   Allergic rhinitis 08/20/2006   Past Surgical History:  Procedure Laterality Date   BREAST BIOPSY Right 1994   benign   BREAST CYST EXCISION Right    20 years ago   COLONOSCOPY     MENISCUS DEBRIDEMENT Right 2021    Family History  Problem Relation Age of Onset   Breast cancer Mother 36   Osteoarthritis Mother 20   Rheum arthritis Mother 71       psoriatic arthritis, osteoarthritis   Lymphoma Mother    Dementia Mother    Hearing loss Mother    Leukemia Father 35       deceased   Arthritis Father    Colon polyps Sister    Rheum arthritis Sister    Heart disease Paternal Grandmother    Breast cancer Maternal Grandmother    Breast cancer Maternal Aunt        x 2   Alcohol abuse Brother    Rheum arthritis Sister     Medications- reviewed and updated Current Outpatient Medications  Medication Sig Dispense Refill   Cetirizine HCl (ZYRTEC PO) Take 10 mg by mouth daily.      diphenhydrAMINE HCl (BENADRYL PO) Take by mouth at bedtime.     potassium chloride (KLOR-CON M) 10 MEQ tablet Take 1 tablet (10 mEq  total) by mouth daily. 90 tablet 1   simvastatin (ZOCOR) 40 MG tablet TAKE 1 TABLET BY MOUTH EVERYDAY AT BEDTIME 90 tablet 1   triamterene-hydrochlorothiazide (DYAZIDE) 37.5-25 MG capsule Take 1 tablet by mouth daily.     valACYclovir (VALTREX) 500 MG tablet Take 1 tablet (500 mg total) by mouth daily. 90 tablet 3   No current facility-administered medications for this visit.    Allergies-reviewed and updated Allergies  Allergen Reactions   Lipitor [Atorvastatin]     Constipation, low back pain   Nickel     Other reaction(s): Other (See Comments)   Other     Nickel and formaldehyde causes rash   Formaldehyde Rash   Neomycin Rash    rash    Social History   Social History Narrative   Work or School: none       Home Situation: lives with husband      Spiritual Beliefs: catholic      Lifestyle: regular exercise- healthy diet but has sweet tooth      Objective  Objective:  BP 114/70 (BP Location: Left Arm, Patient Position: Sitting)   Pulse 70   Temp (!) 97.5 F (36.4 C) (Temporal)   Ht 5\' 9"  (1.753 m)   Wt 167 lb 9.6 oz (76 kg)   LMP 09/09/2014   SpO2 98%   BMI 24.75 kg/m  Physical Exam  Gen: WDWN NAD HEENT: NCAT, conjunctiva not injected, sclera nonicteric TM WNL B, OP moist, no exudates .  Took off hearing aids NECK:  supple, no thyromegaly, no nodes, no carotid bruits CARDIAC: RRR, S1S2+, no murmur. DP 2+B LUNGS: CTAB. No wheezes ABDOMEN:  BS+, soft, NTND, No HSM, no masses EXT:  no edema MSK: no gross abnormalities. MS 5/5 all 4.  No TTP lower back NEURO: A&O x3.  CN II-XII intact.  PSYCH: normal mood. Good eye contact   Results for orders placed or performed in visit on 11/24/21  POCT urinalysis dipstick  Result Value Ref Range   Color, UA yellow    Clarity, UA clear    Glucose, UA Negative Negative   Bilirubin, UA Negative    Ketones, UA Negative    Spec Grav, UA <=1.005 (A) 1.010 - 1.025   Blood, UA Negative    pH, UA 6.5 5.0 - 8.0   Protein, UA Negative Negative   Urobilinogen, UA 0.2 0.2 or 1.0 E.U./dL   Nitrite, UA Negative    Leukocytes, UA Negative Negative   Appearance     Odor        Assessment and Plan   Health Maintenance counseling: 1. Anticipatory guidance: Patient counseled regarding regular dental exams q6 months, eye exams,  avoiding smoking and second hand smoke, limiting alcohol to 1 beverage per day, no illicit drugs.   2. Risk factor reduction:  Advised patient of need for regular exercise and diet rich and fruits and vegetables to reduce risk of heart attack and stroke. Exercise- +.  Wt Readings from Last 3 Encounters:  11/24/21 167 lb 9.6 oz (76 kg)  08/16/21 202 lb 6.4 oz (91.8 kg)  05/19/21 198 lb 7 oz (90 kg)   3.  Immunizations/screenings/ancillary studies Immunization History  Administered Date(s) Administered   Influenza Inj Mdck Quad Pf 10/08/2016   Influenza Split 10/11/2013   Influenza Whole 10/11/2006   Influenza, Quadrivalent, Recombinant, Inj, Pf 10/23/2017   Influenza,inj,Quad PF,6+ Mos 09/23/2014, 09/22/2018   Influenza-Unspecified 10/08/2013, 09/09/2014, 10/08/2016, 10/23/2017, 10/23/2019, 10/25/2020, 10/09/2021   PFIZER(Purple Top)SARS-COV-2 Vaccination  03/28/2019, 04/18/2019, 11/24/2019, 04/19/2020, 10/07/2020   Pfizer Covid-19 Vaccine Bivalent Booster 58yrs & up 11/20/2021   Td 03/09/1997, 11/03/2008   Tdap 02/25/2019   Zoster Recombinat (Shingrix) 09/29/2018, 12/12/2018   There are no preventive care reminders to display for this patient.  4. Cervical cancer screening- gyn 08/01/21 5. Breast cancer screening-  mammogram UTD 6. Colon cancer screening - due in March 7. Skin cancer screening- advised regular sunscreen use. Denies worrisome, changing, or new skin lesions.  8. Birth control/STD check- n/a 9. Osteoporosis screening- UTD 10. Smoking associated screening - non smoker  Problem List Items Addressed This Visit       Nervous and Auditory   Meniere's disease of both ears     Other   Hyperlipemia   Relevant Medications   triamterene-hydrochlorothiazide (DYAZIDE) 37.5-25 MG capsule   Other Relevant Orders   Lipid panel   Prediabetes   Other Visit Diagnoses     Wellness examination    -  Primary   Relevant Orders   Comprehensive metabolic panel   Hemoglobin A1c   Lipid panel   TSH   CBC with Differential/Platelet   POCT urinalysis dipstick (Completed)   Chronic bilateral low back pain without sciatica       Relevant Orders   DG Lumbar Spine Complete   Ambulatory referral to Physical Therapy     Wellness-anticipatory guidance.  Work on [employer].  Check CBC,CMP,lipids,TSH, A1C.UA  F/u 1 yr  Menier's dz-followed by ENT-weaning dyazide over next 2 months.   Hopefully, will be off K when off that.  Monitor PreDM-cont diet/exercise.  Check A1C HLD-chronic.  Cont zocor 40mg .  Check lipids.   LBP-chronic for 10mo  some DJD on dxa.  Will do spine films.  Refer to PT.   Recommended follow up: annualReturn in about 1 year (around 11/25/2022) for annual. No future appointments.  Lab/Order associations:+ fasting   ICD-10-CM   1. Wellness examination  Z00.00 Comprehensive metabolic panel    Hemoglobin A1c    Lipid panel    TSH    CBC with Differential/Platelet    POCT urinalysis dipstick    2. Pure hypercholesterolemia  E78.00 Lipid panel    3. Chronic bilateral low back pain without sciatica  M54.50 DG Lumbar Spine Complete   G89.29 Ambulatory referral to Physical Therapy    4. Meniere's disease of both ears  H81.03     5. Prediabetes  R73.03       No orders of the defined types were placed in this encounter.   11/27/2022, MD

## 2021-11-26 NOTE — Progress Notes (Signed)
1.  Cholesterol is so much better-keep up the good work and continue Zocor 2.  A1c is about the same-continue working on diet/exercise Rest of labs are great

## 2021-11-27 ENCOUNTER — Ambulatory Visit (INDEPENDENT_AMBULATORY_CARE_PROVIDER_SITE_OTHER)
Admission: RE | Admit: 2021-11-27 | Discharge: 2021-11-27 | Disposition: A | Discharge status: Home / Self Care | Payer: BC Managed Care – PPO | Source: Ambulatory Visit | Attending: Family Medicine | Admitting: Family Medicine

## 2021-11-27 DIAGNOSIS — M545 Low back pain, unspecified: Secondary | ICD-10-CM | POA: Diagnosis not present

## 2021-11-27 DIAGNOSIS — G8929 Other chronic pain: Secondary | ICD-10-CM | POA: Diagnosis not present

## 2021-11-27 DIAGNOSIS — M47816 Spondylosis without myelopathy or radiculopathy, lumbar region: Secondary | ICD-10-CM | POA: Diagnosis not present

## 2021-11-27 DIAGNOSIS — F411 Generalized anxiety disorder: Secondary | ICD-10-CM | POA: Diagnosis not present

## 2021-11-27 DIAGNOSIS — M5137 Other intervertebral disc degeneration, lumbosacral region: Secondary | ICD-10-CM | POA: Diagnosis not present

## 2021-11-28 NOTE — Therapy (Signed)
[facility] OUT[facility] PHYS[facility] THERAPY THORACOLUMBAR EVALUATION   Patient Name: Heather Chen MRN: [id number] DOB:[birth year], 61 y.o., female Today's Date: 11/29/2021  END OF SESSION:  PT End of Session - 11/29/21 1017     Visit Number 1    Number of Visits 12    Date for PT Re-Evaluation 02/21/22    Authorization Type [employer], VL 30    Authorization - Visit Number 1    Authorization - Number of Visits 30    PT Start Time 1018    PT Stop Time 1104    PT Time Calculation (min) 46 min             Past Medical History:  Diagnosis Date   Abdominal migraine, not intractable 05/30/2015   Allergy    SEASONAL   Anemia    GERD (gastroesophageal reflux disease)    H/O seasonal allergies    Heart murmur    AS A CHILD   Hyperlipidemia    Meniere disease, bilateral    Migraine headache    Prediabetes    Tennis elbow    Past Surgical History:  Procedure Laterality Date   BREAST BIOPSY Right 1994   benign   BREAST CYST EXCISION Right    20 years ago   COLONOSCOPY     MENISCUS DEBRIDEMENT Right 2021   Patient Active Problem List   Diagnosis Date Noted   Prediabetes 08/16/2021   Meniere's disease of both ears 12/26/2020   Right epiretinal membrane 07/27/2020   Hearing loss 11/23/2019   Posterior vitreous detachment of right eye 06/16/2019   Posterior vitreous detachment of left eye 06/16/2019   Nuclear sclerotic cataract of both eyes 06/16/2019   Vitreous floaters, right 06/16/2019   Abdominal migraine, not intractable 05/30/2015   Hyperlipemia 08/20/2006   Allergic rhinitis 08/20/2006    PCP: [name], MD  REFERRING PROVIDER: [name], MD  REFERRING DIAG: [name], MD  Rationale for Evaluation and Treatment: Rehabilitation  THERAPY DIAG:  Other low back pain  Muscle weakness (generalized)  ONSET DATE: March 62836  SUBJECTIVE:                                                                                                                                                                                            SUBJECTIVE STATEMENT: States that she has had back pain and she thought it was because of a new statin but she stopped taking it. States that she has pain in the morning and she sleeps in different positions and uses pillows for support.  Pain is described as stiffness and is worse with  bending over when she ties her shoes she also has pain when sneezing.  Patient is very active works out 2 times a week at [employer] with a trainer and walks 2 to 4 miles a few times a week.  Likes to work in her garden and stay active   PERTINENT HISTORY:  Migraines, Meniere's disease, R knee surgery,   PAIN:  Are you having pain? Yes: NPRS scale: 3-4/10 Pain location: low back at L5/S1  Pain description: dull achy Aggravating factors: sitting, AM, bending Relieving factors: unsure  PRECAUTIONS: None  WEIGHT BEARING RESTRICTIONS: No  FALLS:  Has patient fallen in last 6 months? No     PLOF: Independent  PATIENT GOALS: to be able to stay active   NEXT MD VISIT:   OBJECTIVE:   DIAGNOSTIC FINDINGS:  Lumbar xray- mild degeneration in lumbar spine    SCREENING FOR RED FLAGS: Bowel or bladder incontinence: No Spinal tumors: No Cauda equina syndrome: No Compression fracture: No Abdominal aneurysm: No  COGNITION: Overall cognitive status: Within functional limits for tasks assessed        POSTURE: forward head and posterior pelvic tilt  PALPATION: Tenderness to palpation to right glutes and incre  LUMBAR ROM:   AROM eval  Flexion 50% limited *  Extension 75% limited *  Right lateral flexion 25% limited*  Left lateral flexion 50% limited *  Right rotation   Left rotation    (Blank rows = not tested)  *stiff/pain   LE Measurements Lower Extremity Right 11/29/2021 Left 11/29/2021   A/PROM MMT A/PROM MMT  Hip Flexion      Hip Extension 4+ 10 4 5   Hip Abduction      Hip Adduction      Hip  Internal rotation  45  25*  Hip External rotation  50  30*  Knee Flexion      Knee Extension      Ankle Dorsiflexion      Ankle Plantarflexion      Ankle Inversion      Ankle Eversion       (Blank rows = not tested) * pain  LUMBAR SPECIAL TESTS:  Straight leg raise test: Negative Ely's + on right      TODAY'S TREATMENT:                                                                                                                              DATE:   11/29/2021  Therapeutic Exercise:  Aerobic: Supine:bent knee fall outs 2 minutes, bent knee fall ins 2 minutes, LT 2 minutes Prone:POE x10 10" holds  Seated:  Standing: Neuromuscular Re-education: Manual Therapy: Therapeutic Activity: Self Care: Trigger Point Dry Needling:  Modalities:    PATIENT EDUCATION:  Education details: on current presentation, on HEP, on clinical outcomes score and POC, on current exercise regiments, on differences between different classes Person educated: Patient Education method: Explanation, Demonstration, and Handouts Education comprehension: verbalized understanding  HOME EXERCISE PROGRAM: [id number]  ASSESSMENT:  CLINICAL IMPRESSION: Patient complains of lower back pain that started in March of this year.  Patient presents with limitations in hip range of motion and strength as well as lumbar range of motion.  Educated patient on current condition and answered all questions about current workout routine, sleeping positions, and general differences between strength training and mobility training.  Patient would greatly benefit from skilled physical therapy at this time to reduce pain and improve overall quality of life.  OBJECTIVE IMPAIRMENTS: decreased ROM, decreased strength, postural dysfunction, and pain.   ACTIVITY LIMITATIONS: bending and sitting  PARTICIPATION LIMITATIONS: driving and yard work  PERSONAL FACTORS: Age are also affecting patient's functional outcome.   REHAB  POTENTIAL: Good  CLINICAL DECISION MAKING: Stable/uncomplicated  EVALUATION COMPLEXITY: Low  GOALS: Goals reviewed with patient? yes  SHORT TERM GOALS: Target date: 01/10/2022  Patient will be independent in self management strategies to improve quality of life and functional outcomes. Baseline: New Program Goal status: INITIAL  2.  Patient will report at least 50% improvement in overall symptoms and/or function to demonstrate improved functional mobility Baseline: 0% better Goal status: INITIAL  3.  Patient will be able to demonstrate pain-free hip range of motion Baseline: Unable Goal status: INITIAL      LONG TERM GOALS: Target date: 02/21/2022   Patient will report at least 75% improvement in overall symptoms and/or function to demonstrate improved functional mobility Baseline: 0% better Goal status: INITIAL  2.  Patient will be able to demonstrate pain-free lumbar range of motion. Baseline: Unable Goal status: INITIAL  3.  Patient will be able to wake up in the morning without stiffness after performing basic mobility exercises to improve ability to get out of bed. Baseline: Unable Goal status: INITIAL    PLAN:  PT FREQUENCY: 1-2x/week for a total of 12 visits over 12 week certification period  PT DURATION: 12 weeks  PLANNED INTERVENTIONS: Therapeutic exercises, Therapeutic activity, Neuromuscular re-education, Balance training, Gait training, Patient/Family education, Self Care, Joint mobilization, Joint manipulation, Stair training, Vestibular training, Canalith repositioning, Orthotic/Fit training, DME instructions, Aquatic Therapy, Dry Needling, Electrical stimulation, Spinal manipulation, Spinal mobilization, Cryotherapy, Moist heat, Taping, Traction, Ultrasound, Ionotophoresis 4mg /ml Dexamethasone, Manual therapy, and Re-evaluation.  PLAN FOR NEXT SESSION: trial ball exercises with traction, lumbar/hip mobility, core activation, breath work   11:27 AM,  11/29/21 12/01/21, DPT Physical Therapy with [place]

## 2021-11-29 ENCOUNTER — Encounter: Payer: Self-pay | Admitting: Physical Therapy

## 2021-11-29 ENCOUNTER — Ambulatory Visit (INDEPENDENT_AMBULATORY_CARE_PROVIDER_SITE_OTHER): Payer: BC Managed Care – PPO | Admitting: Physical Therapy

## 2021-11-29 DIAGNOSIS — M5459 Other low back pain: Secondary | ICD-10-CM | POA: Diagnosis not present

## 2021-11-29 DIAGNOSIS — M6281 Muscle weakness (generalized): Secondary | ICD-10-CM

## 2021-12-04 NOTE — Therapy (Unsigned)
OUTPATIENT PHYSICAL THERAPY TREATMENT NOTE   Patient Name: Heather Chen MRN: [id number] DOB:Jan 01, 1961, 61 y.o., female Today's Date: 12/05/2021  PCP: [name], MD   REFERRING PROVIDER: [name], MD  END OF SESSION:   PT End of Session - 12/05/21 0931     Visit Number 2    Number of Visits 12    Date for PT Re-Evaluation 02/21/22    Authorization Type BCBS, VL 30    Authorization - Visit Number 2    Authorization - Number of Visits 30    PT Start Time 0934    PT Stop Time 1014    PT Time Calculation (min) 40 min             Past Medical History:  Diagnosis Date   Abdominal migraine, not intractable 05/30/2015   Allergy    SEASONAL   Anemia    GERD (gastroesophageal reflux disease)    H/O seasonal allergies    Heart murmur    AS A CHILD   Hyperlipidemia    Meniere disease, bilateral    Migraine headache    Prediabetes    Tennis elbow    Past Surgical History:  Procedure Laterality Date   BREAST BIOPSY Right 1994   benign   BREAST CYST EXCISION Right    20 years ago   COLONOSCOPY     MENISCUS DEBRIDEMENT Right 2021   Patient Active Problem List   Diagnosis Date Noted   Prediabetes 08/16/2021   Meniere's disease of both ears 12/26/2020   Right epiretinal membrane 07/27/2020   Hearing loss 11/23/2019   Posterior vitreous detachment of right eye 06/16/2019   Posterior vitreous detachment of left eye 06/16/2019   Nuclear sclerotic cataract of both eyes 06/16/2019   Vitreous floaters, right 06/16/2019   Abdominal migraine, not intractable 05/30/2015   Hyperlipemia 08/20/2006   Allergic rhinitis 08/20/2006    REFERRING DIAG: M54.50,G89.29 (ICD-10-CM) - Chronic bilateral low back pain without sciatica  THERAPY DIAG:  Other low back pain  Muscle weakness (generalized)    Rationale for Evaluation and Treatment: Rehabilitation   THERAPY DIAG:  Other low back pain   Muscle weakness (generalized)   ONSET DATE:  March 60630   SUBJECTIVE:                                                                                                                                                                                            SUBJECTIVE STATEMENT: 12/05/2021 States that she does her exercises 2x a day and feeling alight. States she has been walking a good bit too. States she feels  about the same otherwise   Eval: States that she has had back pain and she thought it was because of a new statin but she stopped taking it. States that she has pain in the morning and she sleeps in different positions and uses pillows for support.  Pain is described as stiffness and is worse with bending over when she ties her shoes she also has pain when sneezing.  Patient is very active works out 2 times a week at [employer] with a trainer and walks 2 to 4 miles a few times a week.  Likes to work in her garden and stay active     PERTINENT HISTORY:  Migraines, Meniere's disease, R knee surgery,    PAIN:  Are you having pain? Yes: NPRS scale: 4/10 Pain location: low back at L5/S1  Pain description: stiffness,achy Aggravating factors: sitting, AM, bending Relieving factors: unsure   PRECAUTIONS: None   WEIGHT BEARING RESTRICTIONS: No   FALLS:  Has patient fallen in last 6 months? No         PLOF: Independent   PATIENT GOALS: to be able to stay active    NEXT MD VISIT:    OBJECTIVE:    DIAGNOSTIC FINDINGS:  Lumbar xray- mild degeneration in lumbar spine     SCREENING FOR RED FLAGS: Bowel or bladder incontinence: No Spinal tumors: No Cauda equina syndrome: No Compression fracture: No Abdominal aneurysm: No   COGNITION: Overall cognitive status: Within functional limits for tasks assessed                    POSTURE: forward head and posterior pelvic tilt   PALPATION: Tenderness to palpation to right glutes and incre   LUMBAR ROM:    AROM eval  Flexion 50% limited *  Extension 75% limited *   Right lateral flexion 25% limited*  Left lateral flexion 50% limited *  Right rotation    Left rotation     (Blank rows = not tested)             *stiff/pain     LE Measurements       Lower Extremity Right 11/29/2021 Left 11/29/2021    A/PROM MMT A/PROM MMT  Hip Flexion          Hip Extension 4+ 10 4 5   Hip Abduction          Hip Adduction          Hip Internal rotation   45   25*  Hip External rotation   50   30*  Knee Flexion          Knee Extension          Ankle Dorsiflexion          Ankle Plantarflexion          Ankle Inversion          Ankle Eversion           (Blank rows = not tested) * pain   LUMBAR SPECIAL TESTS:  Straight leg raise test: Negative Ely's + on right        TODAY'S TREATMENT:  DATE:   12/05/2021    Therapeutic Exercise:             Aerobic: Supine:bent knee fall outs 2 minutes, bent knee fall ins 2 minutes, LTR 2 minutes X2 on ball, hamstring iso 2 minutes on ball, SAQs 2 minutes on ball, DKC 2 MINUTES, 90/90 SUPPORTIVE POSITION ON BALL 5 MINUTES Prone:POE x10 10" holds             Seated:             Standing: Neuromuscular Re-education: Manual Therapy: lumbar traction 8 minutes on ball by PT Therapeutic Activity: Self Care: Trigger Point Dry Needling:  Modalities:      PATIENT EDUCATION:  Education details: on HEP, on anatomy and rationale for interventions Person educated: Patient Education method: Explanation, Demonstration, and Handouts Education comprehension: verbalized understanding     HOME EXERCISE PROGRAM: [id number]   ASSESSMENT:   CLINICAL IMPRESSION: 12/05/2021 Progressed exercises and answered all questions. Tolerated ball exercises well and reported reduced stiffness afterwards. Improved ease of transitioning from supine to sit after session. Will continue with current POC as tolerated.    Eval: Patient complains of lower back pain that started in March of this year.  Patient presents with limitations in hip range of motion and strength as well as lumbar range of motion.  Educated patient on current condition and answered all questions about current workout routine, sleeping positions, and general differences between strength training and mobility training.  Patient would greatly benefit from skilled physical therapy at this time to reduce pain and improve overall quality of life.   OBJECTIVE IMPAIRMENTS: decreased ROM, decreased strength, postural dysfunction, and pain.    ACTIVITY LIMITATIONS: bending and sitting   PARTICIPATION LIMITATIONS: driving and yard work   PERSONAL FACTORS: Age are also affecting patient's functional outcome.    REHAB POTENTIAL: Good   CLINICAL DECISION MAKING: Stable/uncomplicated   EVALUATION COMPLEXITY: Low   GOALS: Goals reviewed with patient? yes   SHORT TERM GOALS: Target date: 01/10/2022  Patient will be independent in self management strategies to improve quality of life and functional outcomes. Baseline: New Program Goal status: INITIAL   2.  Patient will report at least 50% improvement in overall symptoms and/or function to demonstrate improved functional mobility Baseline: 0% better Goal status: INITIAL   3.  Patient will be able to demonstrate pain-free hip range of motion Baseline: Unable Goal status: INITIAL           LONG TERM GOALS: Target date: 02/21/2022    Patient will report at least 75% improvement in overall symptoms and/or function to demonstrate improved functional mobility Baseline: 0% better Goal status: INITIAL   2.  Patient will be able to demonstrate pain-free lumbar range of motion. Baseline: Unable Goal status: INITIAL   3.  Patient will be able to wake up in the morning without stiffness after performing basic mobility exercises to improve ability to get out of bed. Baseline: Unable Goal  status: INITIAL       PLAN:   PT FREQUENCY: 1-2x/week for a total of 12 visits over 12 week certification period   PT DURATION: 12 weeks   PLANNED INTERVENTIONS: Therapeutic exercises, Therapeutic activity, Neuromuscular re-education, Balance training, Gait training, Patient/Family education, Self Care, Joint mobilization, Joint manipulation, Stair training, Vestibular training, Canalith repositioning, Orthotic/Fit training, DME instructions, Aquatic Therapy, Dry Needling, Electrical stimulation, Spinal manipulation, Spinal mobilization, Cryotherapy, Moist heat, Taping, Traction, Ultrasound, Ionotophoresis 4mg /ml Dexamethasone, Manual therapy, and Re-evaluation.   PLAN  FOR NEXT SESSION: trial ball exercises with traction, lumbar/hip mobility, core activation, breath work     10:17 AM, 12/05/21 Tereasa Coop, DPT Physical Therapy with [place]

## 2021-12-05 ENCOUNTER — Encounter: Payer: Self-pay | Admitting: Family Medicine

## 2021-12-05 ENCOUNTER — Ambulatory Visit (INDEPENDENT_AMBULATORY_CARE_PROVIDER_SITE_OTHER): Payer: BC Managed Care – PPO | Admitting: Physical Therapy

## 2021-12-05 ENCOUNTER — Encounter: Payer: Self-pay | Admitting: Physical Therapy

## 2021-12-05 DIAGNOSIS — F411 Generalized anxiety disorder: Secondary | ICD-10-CM | POA: Diagnosis not present

## 2021-12-05 DIAGNOSIS — M6281 Muscle weakness (generalized): Secondary | ICD-10-CM | POA: Diagnosis not present

## 2021-12-05 DIAGNOSIS — M5459 Other low back pain: Secondary | ICD-10-CM

## 2021-12-06 ENCOUNTER — Other Ambulatory Visit: Payer: Self-pay | Admitting: Family Medicine

## 2021-12-06 MED ORDER — POTASSIUM CHLORIDE CRYS ER 10 MEQ PO TBCR: 10.0000 meq | EXTENDED_RELEASE_TABLET | Freq: Every day | ORAL | 1 refills | Status: DC

## 2021-12-07 ENCOUNTER — Ambulatory Visit (INDEPENDENT_AMBULATORY_CARE_PROVIDER_SITE_OTHER): Payer: BC Managed Care – PPO | Admitting: Physical Therapy

## 2021-12-07 ENCOUNTER — Encounter: Payer: Self-pay | Admitting: Physical Therapy

## 2021-12-07 DIAGNOSIS — M5459 Other low back pain: Secondary | ICD-10-CM | POA: Diagnosis not present

## 2021-12-07 DIAGNOSIS — M6281 Muscle weakness (generalized): Secondary | ICD-10-CM | POA: Diagnosis not present

## 2021-12-07 NOTE — Therapy (Signed)
OUTPATIENT PHYSICAL THERAPY TREATMENT NOTE   Patient Name: Heather Chen MRN: [id number] DOB:October 08, 1960, 61 y.o., female Today's Date: 12/07/2021  PCP: [name], MD   REFERRING PROVIDER: [name], MD  END OF SESSION:   PT End of Session - 12/07/21 1506     Visit Number 3    Number of Visits 12    Date for PT Re-Evaluation 02/21/22    Authorization Type BCBS, VL 30    Authorization - Visit Number 3    Authorization - Number of Visits 30    PT Start Time 1515    PT Stop Time 1556    PT Time Calculation (min) 41 min             Past Medical History:  Diagnosis Date   Abdominal migraine, not intractable 05/30/2015   Allergy    SEASONAL   Anemia    GERD (gastroesophageal reflux disease)    H/O seasonal allergies    Heart murmur    AS A CHILD   Hyperlipidemia    Meniere disease, bilateral    Migraine headache    Prediabetes    Tennis elbow    Past Surgical History:  Procedure Laterality Date   BREAST BIOPSY Right 1994   benign   BREAST CYST EXCISION Right    20 years ago   COLONOSCOPY     MENISCUS DEBRIDEMENT Right 2021   Patient Active Problem List   Diagnosis Date Noted   Prediabetes 08/16/2021   Meniere's disease of both ears 12/26/2020   Right epiretinal membrane 07/27/2020   Hearing loss 11/23/2019   Posterior vitreous detachment of right eye 06/16/2019   Posterior vitreous detachment of left eye 06/16/2019   Nuclear sclerotic cataract of both eyes 06/16/2019   Vitreous floaters, right 06/16/2019   Abdominal migraine, not intractable 05/30/2015   Hyperlipemia 08/20/2006   Allergic rhinitis 08/20/2006    REFERRING DIAG: M54.50,G89.29 (ICD-10-CM) - Chronic bilateral low back pain without sciatica  THERAPY DIAG:  Other low back pain  Muscle weakness (generalized)    Rationale for Evaluation and Treatment: Rehabilitation   THERAPY DIAG:  Other low back pain   Muscle weakness (generalized)   ONSET DATE:  March 35456   SUBJECTIVE:                                                                                                                                                                                            SUBJECTIVE STATEMENT: 12/07/2021 States she is tired. She has been doing a lot and her she did her exercises and she was fine. States that she was  stiff this morning.    Eval: States that she has had back pain and she thought it was because of a new statin but she stopped taking it. States that she has pain in the morning and she sleeps in different positions and uses pillows for support.  Pain is described as stiffness and is worse with bending over when she ties her shoes she also has pain when sneezing.  Patient is very active works out 2 times a week at [employer] with a trainer and walks 2 to 4 miles a few times a week.  Likes to work in her garden and stay active     PERTINENT HISTORY:  Migraines, Meniere's disease, R knee surgery,    PAIN:  Are you having pain? Yes: NPRS scale: 4/10 Pain location: low back at L5/S1  Pain description: stiffness,achy Aggravating factors: sitting, AM, bending Relieving factors: unsure   PRECAUTIONS: None   WEIGHT BEARING RESTRICTIONS: No   FALLS:  Has patient fallen in last 6 months? No         PLOF: Independent   PATIENT GOALS: to be able to stay active    NEXT MD VISIT:    OBJECTIVE:    DIAGNOSTIC FINDINGS:  Lumbar xray- mild degeneration in lumbar spine     SCREENING FOR RED FLAGS: Bowel or bladder incontinence: No Spinal tumors: No Cauda equina syndrome: No Compression fracture: No Abdominal aneurysm: No   COGNITION: Overall cognitive status: Within functional limits for tasks assessed                    POSTURE: forward head and posterior pelvic tilt   PALPATION: Tenderness to palpation to right glutes and incre   LUMBAR ROM:    AROM eval  Flexion 50% limited *  Extension 75% limited *  Right lateral  flexion 25% limited*  Left lateral flexion 50% limited *  Right rotation    Left rotation     (Blank rows = not tested)             *stiff/pain     LE Measurements       Lower Extremity Right 11/29/2021 Left 11/29/2021    A/PROM MMT A/PROM MMT  Hip Flexion          Hip Extension 4+ 10 4 5   Hip Abduction          Hip Adduction          Hip Internal rotation   45   25*  Hip External rotation   50   30*  Knee Flexion          Knee Extension          Ankle Dorsiflexion          Ankle Plantarflexion          Ankle Inversion          Ankle Eversion           (Blank rows = not tested) * pain   LUMBAR SPECIAL TESTS:  Straight leg raise test: Negative Ely's + on right        TODAY'S TREATMENT:  DATE:   12/07/2021  Therapeutic Exercise:             Aerobic: Supine: LTR 2 minutes X2 on ball, hamstring iso 2 minutes on ball, SAQs 2 minutes on ball, DKC 2 MINUTES,  bridges - in 3 different positions 3x5 each 3" holds Prone:              Seated:             Standing: Neuromuscular Re-education: Manual Therapy: lumbar traction 8 minutes on ball by PT Therapeutic Activity: Self Care: Trigger Point Dry Needling:  Modalities:      PATIENT EDUCATION:  Education details: on HEP, on when to do exercises, what exercises to do, on anatomy. Person educated: Patient Education method: Explanation, Demonstration, and Handouts Education comprehension: verbalized understanding     HOME EXERCISE PROGRAM: [id number]   ASSESSMENT:   CLINICAL IMPRESSION: 12/07/2021 Session focused on alleviating tightness, able to do this with ball exercises. Answered all questions and discussed appropriate frequency and duration of exercise.s No stiffness noted end of session. Will continue to progress hip exercises as tolerated.   Eval: Patient complains of lower back pain  that started in March of this year.  Patient presents with limitations in hip range of motion and strength as well as lumbar range of motion.  Educated patient on current condition and answered all questions about current workout routine, sleeping positions, and general differences between strength training and mobility training.  Patient would greatly benefit from skilled physical therapy at this time to reduce pain and improve overall quality of life.   OBJECTIVE IMPAIRMENTS: decreased ROM, decreased strength, postural dysfunction, and pain.    ACTIVITY LIMITATIONS: bending and sitting   PARTICIPATION LIMITATIONS: driving and yard work   PERSONAL FACTORS: Age are also affecting patient's functional outcome.    REHAB POTENTIAL: Good   CLINICAL DECISION MAKING: Stable/uncomplicated   EVALUATION COMPLEXITY: Low   GOALS: Goals reviewed with patient? yes   SHORT TERM GOALS: Target date: 01/10/2022  Patient will be independent in self management strategies to improve quality of life and functional outcomes. Baseline: New Program Goal status: INITIAL   2.  Patient will report at least 50% improvement in overall symptoms and/or function to demonstrate improved functional mobility Baseline: 0% better Goal status: INITIAL   3.  Patient will be able to demonstrate pain-free hip range of motion Baseline: Unable Goal status: INITIAL           LONG TERM GOALS: Target date: 02/21/2022    Patient will report at least 75% improvement in overall symptoms and/or function to demonstrate improved functional mobility Baseline: 0% better Goal status: INITIAL   2.  Patient will be able to demonstrate pain-free lumbar range of motion. Baseline: Unable Goal status: INITIAL   3.  Patient will be able to wake up in the morning without stiffness after performing basic mobility exercises to improve ability to get out of bed. Baseline: Unable Goal status: INITIAL       PLAN:   PT FREQUENCY:  1-2x/week for a total of 12 visits over 12 week certification period   PT DURATION: 12 weeks   PLANNED INTERVENTIONS: Therapeutic exercises, Therapeutic activity, Neuromuscular re-education, Balance training, Gait training, Patient/Family education, Self Care, Joint mobilization, Joint manipulation, Stair training, Vestibular training, Canalith repositioning, Orthotic/Fit training, DME instructions, Aquatic Therapy, Dry Needling, Electrical stimulation, Spinal manipulation, Spinal mobilization, Cryotherapy, Moist heat, Taping, Traction, Ultrasound, Ionotophoresis 4mg /ml Dexamethasone, Manual therapy, and Re-evaluation.   PLAN FOR  NEXT SESSION: trial ball exercises with traction, lumbar/hip mobility, core activation, breath work     3:59 PM, 12/07/21 Tereasa CoopMichele Daylen Hack, DPT Physical Therapy with [place]

## 2021-12-11 ENCOUNTER — Ambulatory Visit (INDEPENDENT_AMBULATORY_CARE_PROVIDER_SITE_OTHER): Payer: BC Managed Care – PPO | Admitting: Physical Therapy

## 2021-12-11 ENCOUNTER — Encounter: Payer: Self-pay | Admitting: Physical Therapy

## 2021-12-11 DIAGNOSIS — M5459 Other low back pain: Secondary | ICD-10-CM | POA: Diagnosis not present

## 2021-12-11 DIAGNOSIS — M6281 Muscle weakness (generalized): Secondary | ICD-10-CM

## 2021-12-11 NOTE — Therapy (Signed)
OUTPATIENT PHYSICAL THERAPY TREATMENT NOTE   Patient Name: Heather Chen MRN: [id number] DOB:02/11/1960, 61 y.o., female Today's Date: 12/11/2021  PCP: Jeani SowKulik, Ann Marie, MD   REFERRING PROVIDER: Jeani SowKulik, Ann Marie, MD  END OF SESSION:   PT End of Session - 12/11/21 0932     Visit Number 4    Number of Visits 12    Date for PT Re-Evaluation 02/21/22    Authorization Type BCBS, VL 30    Authorization - Visit Number 4    Authorization - Number of Visits 30    PT Start Time 0933    PT Stop Time 1013    PT Time Calculation (min) 40 min             Past Medical History:  Diagnosis Date   Abdominal migraine, not intractable 05/30/2015   Allergy    SEASONAL   Anemia    GERD (gastroesophageal reflux disease)    H/O seasonal allergies    Heart murmur    AS A CHILD   Hyperlipidemia    Meniere disease, bilateral    Migraine headache    Prediabetes    Tennis elbow    Past Surgical History:  Procedure Laterality Date   BREAST BIOPSY Right 1994   benign   BREAST CYST EXCISION Right    20 years ago   COLONOSCOPY     MENISCUS DEBRIDEMENT Right 2021   Patient Active Problem List   Diagnosis Date Noted   Prediabetes 08/16/2021   Meniere's disease of both ears 12/26/2020   Right epiretinal membrane 07/27/2020   Hearing loss 11/23/2019   Posterior vitreous detachment of right eye 06/16/2019   Posterior vitreous detachment of left eye 06/16/2019   Nuclear sclerotic cataract of both eyes 06/16/2019   Vitreous floaters, right 06/16/2019   Abdominal migraine, not intractable 05/30/2015   Hyperlipemia 08/20/2006   Allergic rhinitis 08/20/2006    REFERRING DIAG: M54.50,G89.29 (ICD-10-CM) - Chronic bilateral low back pain without sciatica  THERAPY DIAG:  Other low back pain  Muscle weakness (generalized)    Rationale for Evaluation and Treatment: Rehabilitation   THERAPY DIAG:  Other low back pain   Muscle weakness (generalized)   ONSET DATE:  March [id number]   SUBJECTIVE:                                                                                                                                                                                            SUBJECTIVE STATEMENT: 12/11/2021 States she did a lot of her house work yesterday and she did her exercises and it seemed to helped. States she was stiff  this morning but didn't do her exercise.    Eval: States that she has had back pain and she thought it was because of a new statin but she stopped taking it. States that she has pain in the morning and she sleeps in different positions and uses pillows for support.  Pain is described as stiffness and is worse with bending over when she ties her shoes she also has pain when sneezing.  Patient is very active works out 2 times a week at [employer] with a trainer and walks 2 to 4 miles a few times a week.  Likes to work in her garden and stay active     PERTINENT HISTORY:  Migraines, Meniere's disease, R knee surgery,    PAIN:  Are you having pain? Yes: NPRS scale: 5/10 Pain location: low back at L5/S1  Pain description: stiffness,achy Aggravating factors: sitting, AM, bending Relieving factors: unsure   PRECAUTIONS: None   WEIGHT BEARING RESTRICTIONS: No   FALLS:  Has patient fallen in last 6 months? No         PLOF: Independent   PATIENT GOALS: to be able to stay active    NEXT MD VISIT:    OBJECTIVE:    DIAGNOSTIC FINDINGS:  Lumbar xray- mild degeneration in lumbar spine     SCREENING FOR RED FLAGS: Bowel or bladder incontinence: No Spinal tumors: No Cauda equina syndrome: No Compression fracture: No Abdominal aneurysm: No   COGNITION: Overall cognitive status: Within functional limits for tasks assessed                    POSTURE: forward head and posterior pelvic tilt   PALPATION: Tenderness to palpation to right glutes and incre   LUMBAR ROM:    AROM eval  Flexion 50% limited *  Extension 75%  limited *  Right lateral flexion 25% limited*  Left lateral flexion 50% limited *  Right rotation    Left rotation     (Blank rows = not tested)             *stiff/pain     LE Measurements       Lower Extremity Right 11/29/2021 Left 11/29/2021    A/PROM MMT A/PROM MMT  Hip Flexion          Hip Extension 4+ 10 4 5   Hip Abduction          Hip Adduction          Hip Internal rotation   45   25*  Hip External rotation   50   30*  Knee Flexion          Knee Extension          Ankle Dorsiflexion          Ankle Plantarflexion          Ankle Inversion          Ankle Eversion           (Blank rows = not tested) * pain   LUMBAR SPECIAL TESTS:  Straight leg raise test: Negative Ely's + on right        TODAY'S TREATMENT:  DATE:   12/11/2021  Therapeutic Exercise:             Aerobic: Supine: LTR 2 minutes X2 on ball, hamstring iso 2 minutes on ball, SAQs 2 minutes on ball, DKC 2 MINUTES, Prone: laying on              Seated:             Standing: Neuromuscular Re-education: prone lying with reduced glute activation - tolerated well.belly breathing  in prone 5 minutes - belly breathing supine - 10 minutes, belly breathing seated 5 minutes Manual Therapy:  Therapeutic Activity: Self Care: Trigger Point Dry Needling:  Modalities:      PATIENT EDUCATION:  Education details: on HEP, on when to do exercises, what exercises to do. Person educated: Patient Education method: Explanation, Demonstration, and Handouts Education comprehension: verbalized understanding     HOME EXERCISE PROGRAM: [id number]   ASSESSMENT:   CLINICAL IMPRESSION: 12/11/2021 Sessionf focused on review of exercises and education/practice on belly breathing and posture. This was tolerated well and improved diaphragmatic breath noted with practice. Very challenging for patient to  perform in seated position. Instructed patient to practice while in supine. Will continue with current POC as tolerated.   Eval: Patient complains of lower back pain that started in March of this year.  Patient presents with limitations in hip range of motion and strength as well as lumbar range of motion.  Educated patient on current condition and answered all questions about current workout routine, sleeping positions, and general differences between strength training and mobility training.  Patient would greatly benefit from skilled physical therapy at this time to reduce pain and improve overall quality of life.   OBJECTIVE IMPAIRMENTS: decreased ROM, decreased strength, postural dysfunction, and pain.    ACTIVITY LIMITATIONS: bending and sitting   PARTICIPATION LIMITATIONS: driving and yard work   PERSONAL FACTORS: Age are also affecting patient's functional outcome.    REHAB POTENTIAL: Good   CLINICAL DECISION MAKING: Stable/uncomplicated   EVALUATION COMPLEXITY: Low   GOALS: Goals reviewed with patient? yes   SHORT TERM GOALS: Target date: 01/10/2022  Patient will be independent in self management strategies to improve quality of life and functional outcomes. Baseline: New Program Goal status: INITIAL   2.  Patient will report at least 50% improvement in overall symptoms and/or function to demonstrate improved functional mobility Baseline: 0% better Goal status: INITIAL   3.  Patient will be able to demonstrate pain-free hip range of motion Baseline: Unable Goal status: INITIAL           LONG TERM GOALS: Target date: 02/21/2022    Patient will report at least 75% improvement in overall symptoms and/or function to demonstrate improved functional mobility Baseline: 0% better Goal status: INITIAL   2.  Patient will be able to demonstrate pain-free lumbar range of motion. Baseline: Unable Goal status: INITIAL   3.  Patient will be able to wake up in the morning without  stiffness after performing basic mobility exercises to improve ability to get out of bed. Baseline: Unable Goal status: INITIAL       PLAN:   PT FREQUENCY: 1-2x/week for a total of 12 visits over 12 week certification period   PT DURATION: 12 weeks   PLANNED INTERVENTIONS: Therapeutic exercises, Therapeutic activity, Neuromuscular re-education, Balance training, Gait training, Patient/Family education, Self Care, Joint mobilization, Joint manipulation, Stair training, Vestibular training, Canalith repositioning, Orthotic/Fit training, DME instructions, Aquatic Therapy, Dry Needling, Electrical stimulation, Spinal manipulation,  Spinal mobilization, Cryotherapy, Moist heat, Taping, Traction, Ultrasound, Ionotophoresis 4mg /ml Dexamethasone, Manual therapy, and Re-evaluation.   PLAN FOR NEXT SESSION: trial ball exercises with traction, lumbar/hip mobility, core activation, breath work     10:20 AM, 12/11/21 14/04/23, DPT Physical Therapy with [facility]

## 2021-12-13 ENCOUNTER — Encounter: Payer: BC Managed Care – PPO | Admitting: Physical Therapy

## 2021-12-13 ENCOUNTER — Telehealth (INDEPENDENT_AMBULATORY_CARE_PROVIDER_SITE_OTHER): Payer: BC Managed Care – PPO | Admitting: Family Medicine

## 2021-12-13 ENCOUNTER — Encounter: Payer: Self-pay | Admitting: Family Medicine

## 2021-12-13 VITALS — Temp 99.4°F | Ht 69.0 in | Wt 167.6 lb

## 2021-12-13 DIAGNOSIS — U071 COVID-19: Secondary | ICD-10-CM | POA: Diagnosis not present

## 2021-12-13 NOTE — Progress Notes (Signed)
Patient ID: Gennette Pac Dan Europe, female   DOB: May 07, 1960, 61 y.o.   MRN: [id number]   Virtual Visit via Video Note  I connected with Heather Chen on 12/13/21 at 11:30 AM EST by a video enabled telemedicine application and verified that I am speaking with the correct person using two identifiers.  Location patient: home Location provider:work or home office Persons participating in the virtual visit: patient, provider  I discussed the limitations of evaluation and management by telemedicine and the availability of in person appointments. The patient expressed understanding and agreed to proceed.   HPI:  Heather Chen has COVID-19 by home testing.  She had onset of symptoms around Monday morning possibly late Sunday night.  By Monday she had some nasal congestion and what she described as "typical cold-like symptoms ".  She had some ear fullness.  Low-grade fever.  Temperature this morning 99.4.  She had some mild body aches.  Essentially no cough.  No dyspnea.  Denies any nausea, vomiting, or diarrhea.  Generally healthy.  No major comorbidities.  Non-smoker.  Past Medical History:  Diagnosis Date   Abdominal migraine, not intractable 05/30/2015   Allergy    SEASONAL   Anemia    GERD (gastroesophageal reflux disease)    H/O seasonal allergies    Heart murmur    AS A CHILD   Hyperlipidemia    Meniere disease, bilateral    Migraine headache    Prediabetes    Tennis elbow    Past Surgical History:  Procedure Laterality Date   BREAST BIOPSY Right 1994   benign   BREAST CYST EXCISION Right    20 years ago   COLONOSCOPY     MENISCUS DEBRIDEMENT Right 2021    reports that she quit smoking about 26 years ago. Her smoking use included cigarettes. She has never used smokeless tobacco. She reports current alcohol use of about 5.0 standard drinks of alcohol per week. She reports that she does not use drugs. family history includes Alcohol abuse in her brother; Arthritis in  her father; Breast cancer in her maternal aunt and maternal grandmother; Breast cancer (age of onset: 53) in her mother; Colon polyps in her sister; Dementia in her mother; Hearing loss in her mother; Heart disease in her paternal grandmother; Leukemia (age of onset: 71) in her father; Lymphoma in her mother; Osteoarthritis (age of onset: 5) in her mother; Rheum arthritis in her sister and sister; Rheum arthritis (age of onset: 51) in her mother. Allergies  Allergen Reactions   Lipitor [Atorvastatin]     Constipation, low back pain   Nickel     Other reaction(s): Other (See Comments)   Other     Nickel and formaldehyde causes rash   Formaldehyde Rash   Neomycin Rash    rash      ROS: See pertinent positives and negatives per HPI.  Past Medical History:  Diagnosis Date   Abdominal migraine, not intractable 05/30/2015   Allergy    SEASONAL   Anemia    GERD (gastroesophageal reflux disease)    H/O seasonal allergies    Heart murmur    AS A CHILD   Hyperlipidemia    Meniere disease, bilateral    Migraine headache    Prediabetes    Tennis elbow     Past Surgical History:  Procedure Laterality Date   BREAST BIOPSY Right 1994   benign   BREAST CYST EXCISION Right    20 years ago   COLONOSCOPY  MENISCUS DEBRIDEMENT Right 2021    Family History  Problem Relation Age of Onset   Breast cancer Mother 15   Osteoarthritis Mother 30   Rheum arthritis Mother 5       psoriatic arthritis, osteoarthritis   Lymphoma Mother    Dementia Mother    Hearing loss Mother    Leukemia Father 60       deceased   Arthritis Father    Colon polyps Sister    Rheum arthritis Sister    Heart disease Paternal Grandmother    Breast cancer Maternal Grandmother    Breast cancer Maternal Aunt        x 2   Alcohol abuse Brother    Rheum arthritis Sister     SOCIAL HX: Non-smoker   Current Outpatient Medications:    Cetirizine HCl (ZYRTEC PO), Take 10 mg by mouth daily. , Disp: ,  Rfl:    diphenhydrAMINE HCl (BENADRYL PO), Take by mouth at bedtime., Disp: , Rfl:    montelukast (SINGULAIR) 10 MG tablet, Take 10 mg by mouth daily., Disp: , Rfl:    Multiple Vitamin (MULTIVITAMIN PO), Take by mouth., Disp: , Rfl:    potassium chloride (KLOR-CON M) 10 MEQ tablet, Take 1 tablet (10 mEq total) by mouth daily., Disp: 90 tablet, Rfl: 1   simvastatin (ZOCOR) 40 MG tablet, TAKE 1 TABLET BY MOUTH EVERYDAY AT BEDTIME, Disp: 90 tablet, Rfl: 1   triamterene-hydrochlorothiazide (DYAZIDE) 37.5-25 MG capsule, Take 1 tablet by mouth daily., Disp: , Rfl:    valACYclovir (VALTREX) 500 MG tablet, Take 1 tablet (500 mg total) by mouth daily., Disp: 90 tablet, Rfl: 3  EXAM:  VITALS per patient if applicable: Blood pressure 118/66, pulse 92, pulse oximetry 97%, temperature 99.4  GENERAL: alert, oriented, appears well and in no acute distress  HEENT: atraumatic, conjunttiva clear, no obvious abnormalities on inspection of external nose and ears  NECK: normal movements of the head and neck  LUNGS: on inspection no signs of respiratory distress, breathing rate appears normal, no obvious gross SOB, gasping or wheezing  CV: no obvious cyanosis  MS: moves all visible extremities without noticeable abnormality  PSYCH/NEURO: pleasant and cooperative, no obvious depression or anxiety, speech and thought processing grossly intact  ASSESSMENT AND PLAN:  Discussed the following assessment and plan:  COVID-19 infection.  -We discussed pros and cons of antiviral therapy.  She declines antivirals at this time.  We explained there is a 5-day window for use.  She will be in touch if symptoms worsen. -Plenty of fluids and rest -Discussed isolation recommendations     I discussed the assessment and treatment plan with the patient. The patient was provided an opportunity to ask questions and all were answered. The patient agreed with the plan and demonstrated an understanding of the instructions.    The patient was advised to call back or seek an in-person evaluation if the symptoms worsen or if the condition fails to improve as anticipated.     Carolann Littler, MD

## 2021-12-18 ENCOUNTER — Encounter: Payer: BC Managed Care – PPO | Admitting: Physical Therapy

## 2021-12-20 ENCOUNTER — Encounter: Payer: BC Managed Care – PPO | Admitting: Physical Therapy

## 2021-12-21 DIAGNOSIS — F411 Generalized anxiety disorder: Secondary | ICD-10-CM | POA: Diagnosis not present

## 2021-12-25 ENCOUNTER — Encounter: Payer: BC Managed Care – PPO | Admitting: Physical Therapy

## 2021-12-27 ENCOUNTER — Ambulatory Visit (INDEPENDENT_AMBULATORY_CARE_PROVIDER_SITE_OTHER): Payer: BC Managed Care – PPO | Admitting: Physical Therapy

## 2021-12-27 ENCOUNTER — Encounter: Payer: Self-pay | Admitting: Physical Therapy

## 2021-12-27 DIAGNOSIS — M5459 Other low back pain: Secondary | ICD-10-CM | POA: Diagnosis not present

## 2021-12-27 DIAGNOSIS — M6281 Muscle weakness (generalized): Secondary | ICD-10-CM

## 2021-12-27 NOTE — Therapy (Signed)
OUTPATIENT PHYSICAL THERAPY TREATMENT NOTE   Patient Name: Heather Chen MRN: [id number] DOB:1960-11-22, 61 y.o., female Today's Date: 12/27/2021  PCP: Tawnya Crook, MD   REFERRING PROVIDER: Tawnya Crook, MD  END OF SESSION:   PT End of Session - 12/27/21 0840     Visit Number 5    Number of Visits 12    Date for PT Re-Evaluation 02/21/22    Authorization Type BCBS, VL 30    Authorization - Visit Number 5    Authorization - Number of Visits 30    PT Start Time 0850    PT Stop Time 0930    PT Time Calculation (min) 40 min    Activity Tolerance Patient tolerated treatment well    Behavior During Therapy [facility] for tasks assessed/performed             Past Medical History:  Diagnosis Date   Abdominal migraine, not intractable 05/30/2015   Allergy    SEASONAL   Anemia    GERD (gastroesophageal reflux disease)    H/O seasonal allergies    Heart murmur    AS A CHILD   Hyperlipidemia    Meniere disease, bilateral    Migraine headache    Prediabetes    Tennis elbow    Past Surgical History:  Procedure Laterality Date   BREAST BIOPSY Right 1994   benign   BREAST CYST EXCISION Right    20 years ago   COLONOSCOPY     MENISCUS DEBRIDEMENT Right 2021   Patient Active Problem List   Diagnosis Date Noted   Prediabetes 08/16/2021   Meniere's disease of both ears 12/26/2020   Right epiretinal membrane 07/27/2020   Hearing loss 11/23/2019   Posterior vitreous detachment of right eye 06/16/2019   Posterior vitreous detachment of left eye 06/16/2019   Nuclear sclerotic cataract of both eyes 06/16/2019   Vitreous floaters, right 06/16/2019   Abdominal migraine, not intractable 05/30/2015   Hyperlipemia 08/20/2006   Allergic rhinitis 08/20/2006    REFERRING DIAG: M54.50,G89.29 (ICD-10-CM) - Chronic bilateral low back pain without sciatica  THERAPY DIAG:  Other low back pain  Muscle weakness (generalized)    Rationale for Evaluation and  Treatment: Rehabilitation   THERAPY DIAG:  Other low back pain   Muscle weakness (generalized)   ONSET DATE: March 2023   SUBJECTIVE:                                                                                                                                                                                            SUBJECTIVE STATEMENT: 12/27/2021 States she has been feeling  good with her back. She was sick with COVID but minimal symptoms. States she has been able to do her exercises morning and evening. States she feels like her motion has improved    Eval: States that she has had back pain and she thought it was because of a new statin but she stopped taking it. States that she has pain in the morning and she sleeps in different positions and uses pillows for support.  Pain is described as stiffness and is worse with bending over when she ties her shoes she also has pain when sneezing.  Patient is very active works out 2 times a week at [employer] with a trainer and walks 2 to 4 miles a few times a week.  Likes to work in her garden and stay active     PERTINENT HISTORY:  Migraines, Meniere's disease, R knee surgery,    PAIN:  Are you having pain? Yes: NPRS scale: 2 /10 Pain location: low back at L5/S1  Pain description: tightness  Aggravating factors: sitting, AM, bending Relieving factors: unsure   PRECAUTIONS: None   WEIGHT BEARING RESTRICTIONS: No   FALLS:  Has patient fallen in last 6 months? No         PLOF: Independent   PATIENT GOALS: to be able to stay active    NEXT MD VISIT:    OBJECTIVE:    DIAGNOSTIC FINDINGS:  Lumbar xray- mild degeneration in lumbar spine     SCREENING FOR RED FLAGS: Bowel or bladder incontinence: No Spinal tumors: No Cauda equina syndrome: No Compression fracture: No Abdominal aneurysm: No   COGNITION: Overall cognitive status: Within functional limits for tasks assessed                    POSTURE: forward head and  posterior pelvic tilt   PALPATION: Tenderness to palpation to right glutes and incre   LUMBAR ROM:    AROM eval  Flexion 50% limited *  Extension 75% limited *  Right lateral flexion 25% limited*  Left lateral flexion 50% limited *  Right rotation    Left rotation     (Blank rows = not tested)             *stiff/pain     LE Measurements       Lower Extremity Right 12/27/2021 Left 12/27/2021    A/PROM MMT A/PROM MMT  Hip Flexion          Hip Extension 10 4+ 5 4+  Hip Abduction          Hip Adduction          Hip Internal rotation   60   45  Hip External rotation   50   45  Knee Flexion          Knee Extension          Ankle Dorsiflexion          Ankle Plantarflexion          Ankle Inversion          Ankle Eversion           (Blank rows = not tested) * pain        TODAY'S TREATMENT:  DATE:   12/27/2021  Therapeutic Exercise:             Aerobic: Supine: LTR 2 minutes on ball, hamstring iso 2 minutes on ball, SAQs 2 minutes on ball, DKC 2 MINUTES, Prone: hip extension 3x5 B              Seated: piriformis stretch x3 30" holds B, goddess stretch/ER activation 5 minutes total              Standing: Neuromuscular Re-education:  Manual Therapy:  Therapeutic Activity: Self Care: Trigger Point Dry Needling:  Modalities:      PATIENT EDUCATION:  Education details: on HEP, on ROM/MMT progress Person educated: Patient Education method: Explanation, Media planner, and Handouts Education comprehension: verbalized understanding     HOME EXERCISE PROGRAM: [id number]   ASSESSMENT:   CLINICAL IMPRESSION: 12/27/2021 Focused on progressing exercises and updating objective measurements. Improvement in hip ROM noted and less pain with hip motions. Tolerated session well with no tightness noted end of session. Patient has met 2 short term goals  and is working towards long term goals at this time. Will continue with current POC as tolerated.   Eval: Patient complains of lower back pain that started in March of this year.  Patient presents with limitations in hip range of motion and strength as well as lumbar range of motion.  Educated patient on current condition and answered all questions about current workout routine, sleeping positions, and general differences between strength training and mobility training.  Patient would greatly benefit from skilled physical therapy at this time to reduce pain and improve overall quality of life.   OBJECTIVE IMPAIRMENTS: decreased ROM, decreased strength, postural dysfunction, and pain.    ACTIVITY LIMITATIONS: bending and sitting   PARTICIPATION LIMITATIONS: driving and yard work   PERSONAL FACTORS: Age are also affecting patient's functional outcome.    REHAB POTENTIAL: Good   CLINICAL DECISION MAKING: Stable/uncomplicated   EVALUATION COMPLEXITY: Low   GOALS: Goals reviewed with patient? yes   SHORT TERM GOALS: Target date: 01/10/2022  Patient will be independent in self management strategies to improve quality of life and functional outcomes. Baseline: New Program Goal status: MET   2.  Patient will report at least 50% improvement in overall symptoms and/or function to demonstrate improved functional mobility Baseline: 0% better Goal status: PROGRESSING   3.  Patient will be able to demonstrate pain-free hip range of motion Baseline: Unable Goal status: MET           LONG TERM GOALS: Target date: 02/21/2022    Patient will report at least 75% improvement in overall symptoms and/or function to demonstrate improved functional mobility Baseline: 0% better Goal status: PROGRESSING   2.  Patient will be able to demonstrate pain-free lumbar range of motion. Baseline: Unable Goal status: PROGRESSING   3.  Patient will be able to wake up in the morning without stiffness after  performing basic mobility exercises to improve ability to get out of bed. Baseline: Unable Goal status: PROGRESSING       PLAN:   PT FREQUENCY: 1-2x/week for a total of 12 visits over 12 week certification period   PT DURATION: 12 weeks   PLANNED INTERVENTIONS: Therapeutic exercises, Therapeutic activity, Neuromuscular re-education, Balance training, Gait training, Patient/Family education, Self Care, Joint mobilization, Joint manipulation, Stair training, Vestibular training, Canalith repositioning, Orthotic/Fit training, DME instructions, Aquatic Therapy, Dry Needling, Electrical stimulation, Spinal manipulation, Spinal mobilization, Cryotherapy, Moist heat, Taping, Traction, Ultrasound, Ionotophoresis 44m/ml Dexamethasone, Manual  therapy, and Re-evaluation.   PLAN FOR NEXT SESSION: trial ball exercises with traction, lumbar/hip mobility, core activation, breath work     9:59 AM, 12/27/21 Jerene Pitch, DPT Physical Therapy with [place]

## 2021-12-28 DIAGNOSIS — H524 Presbyopia: Secondary | ICD-10-CM | POA: Diagnosis not present

## 2021-12-28 DIAGNOSIS — H5213 Myopia, bilateral: Secondary | ICD-10-CM | POA: Diagnosis not present

## 2022-01-02 ENCOUNTER — Encounter: Payer: BC Managed Care – PPO | Admitting: Physical Therapy

## 2022-01-03 ENCOUNTER — Ambulatory Visit (INDEPENDENT_AMBULATORY_CARE_PROVIDER_SITE_OTHER): Payer: BC Managed Care – PPO | Admitting: Physical Therapy

## 2022-01-03 ENCOUNTER — Encounter: Payer: Self-pay | Admitting: Physical Therapy

## 2022-01-03 DIAGNOSIS — M6281 Muscle weakness (generalized): Secondary | ICD-10-CM | POA: Diagnosis not present

## 2022-01-03 DIAGNOSIS — M5459 Other low back pain: Secondary | ICD-10-CM

## 2022-01-03 DIAGNOSIS — F411 Generalized anxiety disorder: Secondary | ICD-10-CM | POA: Diagnosis not present

## 2022-01-03 NOTE — Therapy (Signed)
OUTPATIENT PHYSICAL THERAPY TREATMENT NOTE   Patient Name: Heather Chen MRN: [id number] DOB:09-04-1960, 61 y.o., female Today's Date: 01/03/2022  PCP: Tawnya Crook, MD   REFERRING PROVIDER: Tawnya Crook, MD  END OF SESSION:   PT End of Session - 01/03/22 0849     Visit Number 6    Number of Visits 12    Date for PT Re-Evaluation 02/21/22    Authorization Type BCBS, VL 30    Authorization - Visit Number 6    Authorization - Number of Visits 30    PT Start Time [phone]    PT Stop Time 0930    PT Time Calculation (min) 40 min    Activity Tolerance Patient tolerated treatment well    Behavior During Therapy [facility] for tasks assessed/performed             Past Medical History:  Diagnosis Date   Abdominal migraine, not intractable 05/30/2015   Allergy    SEASONAL   Anemia    GERD (gastroesophageal reflux disease)    H/O seasonal allergies    Heart murmur    AS A CHILD   Hyperlipidemia    Meniere disease, bilateral    Migraine headache    Prediabetes    Tennis elbow    Past Surgical History:  Procedure Laterality Date   BREAST BIOPSY Right 1994   benign   BREAST CYST EXCISION Right    20 years ago   COLONOSCOPY     MENISCUS DEBRIDEMENT Right 2021   Patient Active Problem List   Diagnosis Date Noted   Prediabetes 08/16/2021   Meniere's disease of both ears 12/26/2020   Right epiretinal membrane 07/27/2020   Hearing loss 11/23/2019   Posterior vitreous detachment of right eye 06/16/2019   Posterior vitreous detachment of left eye 06/16/2019   Nuclear sclerotic cataract of both eyes 06/16/2019   Vitreous floaters, right 06/16/2019   Abdominal migraine, not intractable 05/30/2015   Hyperlipemia 08/20/2006   Allergic rhinitis 08/20/2006    REFERRING DIAG: M54.50,G89.29 (ICD-10-CM) - Chronic bilateral low back pain without sciatica  THERAPY DIAG:  Other low back pain  Muscle weakness (generalized)    Rationale for Evaluation and  Treatment: Rehabilitation   THERAPY DIAG:  Other low back pain   Muscle weakness (generalized)   ONSET DATE: March 2023   SUBJECTIVE:                                                                                                                                                                                            SUBJECTIVE STATEMENT: 01/03/2022 States that she feels pretty  good. States she is moving around and not noticing her back as much. States she is still tight in the morning. States she would like to go over her HEP.   Eval: States that she has had back pain and she thought it was because of a new statin but she stopped taking it. States that she has pain in the morning and she sleeps in different positions and uses pillows for support.  Pain is described as stiffness and is worse with bending over when she ties her shoes she also has pain when sneezing.  Patient is very active works out 2 times a week at [employer] with a trainer and walks 2 to 4 miles a few times a week.  Likes to work in her garden and stay active     PERTINENT HISTORY:  Migraines, Meniere's disease, R knee surgery,    PAIN:  Are you having pain? Yes: NPRS scale: 2 /10 Pain location: low back at L5/S1  Pain description: tightness  Aggravating factors: sitting, AM, bending Relieving factors: unsure   PRECAUTIONS: None   WEIGHT BEARING RESTRICTIONS: No   FALLS:  Has patient fallen in last 6 months? No         PLOF: Independent   PATIENT GOALS: to be able to stay active    NEXT MD VISIT:    OBJECTIVE:    DIAGNOSTIC FINDINGS:  Lumbar xray- mild degeneration in lumbar spine     SCREENING FOR RED FLAGS: Bowel or bladder incontinence: No Spinal tumors: No Cauda equina syndrome: No Compression fracture: No Abdominal aneurysm: No   COGNITION: Overall cognitive status: Within functional limits for tasks assessed                    POSTURE: forward head and posterior pelvic tilt    PALPATION: Tenderness to palpation to right glutes and incre   LUMBAR ROM:    AROM eval  Flexion 50% limited *  Extension 75% limited *  Right lateral flexion 25% limited*  Left lateral flexion 50% limited *  Right rotation    Left rotation     (Blank rows = not tested)             *stiff/pain     LE Measurements       Lower Extremity Right 12/27/2021 Left 12/27/2021    A/PROM MMT A/PROM MMT  Hip Flexion          Hip Extension 10 4+ 5 4+  Hip Abduction          Hip Adduction          Hip Internal rotation   60   45  Hip External rotation   50   45  Knee Flexion          Knee Extension          Ankle Dorsiflexion          Ankle Plantarflexion          Ankle Inversion          Ankle Eversion           (Blank rows = not tested) * pain        TODAY'S TREATMENT:  DATE:   01/03/2022  Therapeutic Exercise:             Aerobic: Supine  Prone:               Seated:               Standing: Neuromuscular Re-education:  Manual Therapy:  Therapeutic Activity: Self Care: Trigger Point Dry Needling:  Modalities:      PATIENT EDUCATION:  Education details: complete review and consolidation of exercises discussing frequency, intensity and duration as well as times of day to perform. Discussed and answered question and educated patient on active vs passive postures as well as static structures vs active structures in the body. Person educated: Patient Education method: Explanation, Demonstration, and Handouts Education comprehension: verbalized understanding     HOME EXERCISE PROGRAM: [id number]   ASSESSMENT:   CLINICAL IMPRESSION: 01/03/2022 Session focused on review of HEP, answering all questions and consolidating program for patient. Overall patient is doing well and progressing towards goals. Will continue with current POC as tolerated.    Eval: Patient complains of lower back pain that started in March of this year.  Patient presents with limitations in hip range of motion and strength as well as lumbar range of motion.  Educated patient on current condition and answered all questions about current workout routine, sleeping positions, and general differences between strength training and mobility training.  Patient would greatly benefit from skilled physical therapy at this time to reduce pain and improve overall quality of life.   OBJECTIVE IMPAIRMENTS: decreased ROM, decreased strength, postural dysfunction, and pain.    ACTIVITY LIMITATIONS: bending and sitting   PARTICIPATION LIMITATIONS: driving and yard work   PERSONAL FACTORS: Age are also affecting patient's functional outcome.    REHAB POTENTIAL: Good   CLINICAL DECISION MAKING: Stable/uncomplicated   EVALUATION COMPLEXITY: Low   GOALS: Goals reviewed with patient? yes   SHORT TERM GOALS: Target date: 01/10/2022  Patient will be independent in self management strategies to improve quality of life and functional outcomes. Baseline: New Program Goal status: MET   2.  Patient will report at least 50% improvement in overall symptoms and/or function to demonstrate improved functional mobility Baseline: 0% better Goal status: PROGRESSING   3.  Patient will be able to demonstrate pain-free hip range of motion Baseline: Unable Goal status: MET           LONG TERM GOALS: Target date: 02/21/2022    Patient will report at least 75% improvement in overall symptoms and/or function to demonstrate improved functional mobility Baseline: 0% better Goal status: PROGRESSING   2.  Patient will be able to demonstrate pain-free lumbar range of motion. Baseline: Unable Goal status: PROGRESSING   3.  Patient will be able to wake up in the morning without stiffness after performing basic mobility exercises to improve ability to get out of bed. Baseline: Unable Goal  status: PROGRESSING       PLAN:   PT FREQUENCY: 1-2x/week for a total of 12 visits over 12 week certification period   PT DURATION: 12 weeks   PLANNED INTERVENTIONS: Therapeutic exercises, Therapeutic activity, Neuromuscular re-education, Balance training, Gait training, Patient/Family education, Self Care, Joint mobilization, Joint manipulation, Stair training, Vestibular training, Canalith repositioning, Orthotic/Fit training, DME instructions, Aquatic Therapy, Dry Needling, Electrical stimulation, Spinal manipulation, Spinal mobilization, Cryotherapy, Moist heat, Taping, Traction, Ultrasound, Ionotophoresis 6m/ml Dexamethasone, Manual therapy, and Re-evaluation.   PLAN FOR NEXT SESSION: trial ball exercises with traction, lumbar/hip mobility, core activation,  breath work     10:57 AM, 01/03/22 Jerene Pitch, DPT Physical Therapy with [facility]

## 2022-01-04 ENCOUNTER — Ambulatory Visit (INDEPENDENT_AMBULATORY_CARE_PROVIDER_SITE_OTHER): Payer: BC Managed Care – PPO | Admitting: Physical Therapy

## 2022-01-04 ENCOUNTER — Encounter: Payer: Self-pay | Admitting: Physical Therapy

## 2022-01-04 DIAGNOSIS — M5459 Other low back pain: Secondary | ICD-10-CM

## 2022-01-04 DIAGNOSIS — M6281 Muscle weakness (generalized): Secondary | ICD-10-CM | POA: Diagnosis not present

## 2022-01-04 NOTE — Therapy (Addendum)
OUTPATIENT PHYSICAL THERAPY TREATMENT NOTE   Patient Name: Heather Chen MRN: [id number] DOB:[birth year], 61 y.o., female Today's Date: 01/04/2022  PCP: Tawnya Crook, MD   REFERRING PROVIDER: Tawnya Crook, MD  END OF SESSION:   PT End of Session - 01/04/22 1101     Visit Number 7    Number of Visits 12    Date for PT Re-Evaluation 02/21/22    Authorization Type [employer], VL 30    Authorization - Visit Number 7    Authorization - Number of Visits 30    PT Start Time 1105    PT Stop Time 5188    PT Time Calculation (min) 38 min    Activity Tolerance Patient tolerated treatment well    Behavior During Therapy [facility] for tasks assessed/performed             Past Medical History:  Diagnosis Date   Abdominal migraine, not intractable 05/30/2015   Allergy    SEASONAL   Anemia    GERD (gastroesophageal reflux disease)    H/O seasonal allergies    Heart murmur    AS A CHILD   Hyperlipidemia    Meniere disease, bilateral    Migraine headache    Prediabetes    Tennis elbow    Past Surgical History:  Procedure Laterality Date   BREAST BIOPSY Right 1994   benign   BREAST CYST EXCISION Right    20 years ago   COLONOSCOPY     MENISCUS DEBRIDEMENT Right 2021   Patient Active Problem List   Diagnosis Date Noted   Prediabetes 08/16/2021   Meniere's disease of both ears 12/26/2020   Right epiretinal membrane 07/27/2020   Hearing loss 11/23/2019   Posterior vitreous detachment of right eye 06/16/2019   Posterior vitreous detachment of left eye 06/16/2019   Nuclear sclerotic cataract of both eyes 06/16/2019   Vitreous floaters, right 06/16/2019   Abdominal migraine, not intractable 05/30/2015   Hyperlipemia 08/20/2006   Allergic rhinitis 08/20/2006    REFERRING DIAG: M54.50,G89.29 (ICD-10-CM) - Chronic bilateral low back pain without sciatica  THERAPY DIAG:  Other low back pain  Muscle weakness (generalized)    Rationale for Evaluation and  Treatment: Rehabilitation   THERAPY DIAG:  Other low back pain   Muscle weakness (generalized)   ONSET DATE: March 2023   SUBJECTIVE:                                                                                                                                                                                            SUBJECTIVE STATEMENT: 01/04/2022 States that she worked out  this morning and was stiffer and she had to do her ball exercises and the rotation on really seemed to help after 1.5 minutes.    Eval: States that she has had back pain and she thought it was because of a new statin but she stopped taking it. States that she has pain in the morning and she sleeps in different positions and uses pillows for support.  Pain is described as stiffness and is worse with bending over when she ties her shoes she also has pain when sneezing.  Patient is very active works out 2 times a week at [employer] with a trainer and walks 2 to 4 miles a few times a week.  Likes to work in her garden and stay active     PERTINENT HISTORY:  Migraines, Meniere's disease, R knee surgery,    PAIN:  Are you having pain? Yes: NPRS scale: 2 /10 Pain location: low back at L5/S1  Pain description: tightness  Aggravating factors: sitting, AM, bending Relieving factors: unsure   PRECAUTIONS: None   WEIGHT BEARING RESTRICTIONS: No   FALLS:  Has patient fallen in last 6 months? No         PLOF: Independent   PATIENT GOALS: to be able to stay active    NEXT MD VISIT:    OBJECTIVE:    DIAGNOSTIC FINDINGS:  Lumbar xray- mild degeneration in lumbar spine     SCREENING FOR RED FLAGS: Bowel or bladder incontinence: No Spinal tumors: No Cauda equina syndrome: No Compression fracture: No Abdominal aneurysm: No   COGNITION: Overall cognitive status: Within functional limits for tasks assessed                    POSTURE: forward head and posterior pelvic tilt   PALPATION: Tenderness to  palpation to right glutes and incre   LUMBAR ROM:    AROM eval  Flexion 50% limited *  Extension 75% limited *  Right lateral flexion 25% limited*  Left lateral flexion 50% limited *  Right rotation    Left rotation     (Blank rows = not tested)             *stiff/pain     LE Measurements       Lower Extremity Right 12/27/2021 Left 12/27/2021    A/PROM MMT A/PROM MMT  Hip Flexion          Hip Extension 10 4+ 5 4+  Hip Abduction          Hip Adduction          Hip Internal rotation   60   45  Hip External rotation   50   45  Knee Flexion          Knee Extension          Ankle Dorsiflexion          Ankle Plantarflexion          Ankle Inversion          Ankle Eversion           (Blank rows = not tested) * pain        TODAY'S TREATMENT:  DATE:   01/04/2022  Therapeutic Exercise:             Aerobic: Supine self traction on ball and sheet with education 12 minutes total  Prone:  bird dog - too difficult even with ball support - trialed and practiced for 14 minutes, child's pose x10 10" holds, quad plank x4 10 breathes              Seated:               Standing: Neuromuscular Re-education:  Manual Therapy:  Therapeutic Activity: Self Care: Trigger Point Dry Needling:  Modalities:      PATIENT EDUCATION:  Education details: complete review and consolidation of exercises discussing frequency, intensity and duration as well as times of day to perform. Discussed and answered question and educated patient on active vs passive postures as well as static structures vs active structures in the body. Person educated: Patient Education method: Explanation, Demonstration, and Handouts Education comprehension: verbalized understanding     HOME EXERCISE PROGRAM: [id number]   ASSESSMENT:   CLINICAL IMPRESSION: 01/04/2022 Session focused on  education in self traction. Tolerated this well. Tiraled bird dog but too difficult for patient and tolerated quadruped plank better. Fatigue in core and reduced tightness noted end of session. Will conitnue with current POC as tolerated.   Eval: Patient complains of lower back pain that started in March of this year.  Patient presents with limitations in hip range of motion and strength as well as lumbar range of motion.  Educated patient on current condition and answered all questions about current workout routine, sleeping positions, and general differences between strength training and mobility training.  Patient would greatly benefit from skilled physical therapy at this time to reduce pain and improve overall quality of life.   OBJECTIVE IMPAIRMENTS: decreased ROM, decreased strength, postural dysfunction, and pain.    ACTIVITY LIMITATIONS: bending and sitting   PARTICIPATION LIMITATIONS: driving and yard work   PERSONAL FACTORS: Age are also affecting patient's functional outcome.    REHAB POTENTIAL: Good   CLINICAL DECISION MAKING: Stable/uncomplicated   EVALUATION COMPLEXITY: Low   GOALS: Goals reviewed with patient? yes   SHORT TERM GOALS: Target date: 01/10/2022  Patient will be independent in self management strategies to improve quality of life and functional outcomes. Baseline: New Program Goal status: MET   2.  Patient will report at least 50% improvement in overall symptoms and/or function to demonstrate improved functional mobility Baseline: 0% better Goal status: PROGRESSING   3.  Patient will be able to demonstrate pain-free hip range of motion Baseline: Unable Goal status: MET           LONG TERM GOALS: Target date: 02/21/2022    Patient will report at least 75% improvement in overall symptoms and/or function to demonstrate improved functional mobility Baseline: 0% better Goal status: PROGRESSING   2.  Patient will be able to demonstrate pain-free lumbar  range of motion. Baseline: Unable Goal status: PROGRESSING   3.  Patient will be able to wake up in the morning without stiffness after performing basic mobility exercises to improve ability to get out of bed. Baseline: Unable Goal status: PROGRESSING       PLAN:   PT FREQUENCY: 1-2x/week for a total of 12 visits over 12 week certification period   PT DURATION: 12 weeks   PLANNED INTERVENTIONS: Therapeutic exercises, Therapeutic activity, Neuromuscular re-education, Balance training, Gait training, Patient/Family education, Self Care, Joint mobilization, Joint manipulation, Stair  training, Vestibular training, Canalith repositioning, Orthotic/Fit training, DME instructions, Aquatic Therapy, Dry Needling, Electrical stimulation, Spinal manipulation, Spinal mobilization, Cryotherapy, Moist heat, Taping, Traction, Ultrasound, Ionotophoresis 10m/ml Dexamethasone, Manual therapy, and Re-evaluation.   PLAN FOR NEXT SESSION: trial ball exercises with traction, lumbar/hip mobility, core activation, breath work     11:53 AM, 01/04/22 MJerene Pitch DPT Physical Therapy with [place]

## 2022-01-10 ENCOUNTER — Encounter: Payer: Self-pay | Admitting: Physical Therapy

## 2022-01-10 ENCOUNTER — Ambulatory Visit (INDEPENDENT_AMBULATORY_CARE_PROVIDER_SITE_OTHER): Payer: BC Managed Care – PPO | Admitting: Physical Therapy

## 2022-01-10 DIAGNOSIS — M6281 Muscle weakness (generalized): Secondary | ICD-10-CM | POA: Diagnosis not present

## 2022-01-10 DIAGNOSIS — M5459 Other low back pain: Secondary | ICD-10-CM | POA: Diagnosis not present

## 2022-01-10 NOTE — Therapy (Signed)
OUTPATIENT PHYSICAL THERAPY TREATMENT NOTE   Patient Name: Heather Chen MRN: [id number] DOB:Jul 02, 1960, 62 y.o., female Today's Date: 01/10/2022  PCP: Tawnya Crook, MD   REFERRING PROVIDER: Tawnya Crook, MD  END OF SESSION:   PT End of Session - 01/10/22 0931     Visit Number 8    Number of Visits 12    Date for PT Re-Evaluation 02/21/22    Authorization Type BCBS, VL 30    Authorization - Visit Number 8    Authorization - Number of Visits 30    PT Start Time 0932    PT Stop Time 1010    PT Time Calculation (min) 38 min    Activity Tolerance Patient tolerated treatment well    Behavior During Therapy [facility] for tasks assessed/performed             Past Medical History:  Diagnosis Date   Abdominal migraine, not intractable 05/30/2015   Allergy    SEASONAL   Anemia    GERD (gastroesophageal reflux disease)    H/O seasonal allergies    Heart murmur    AS A CHILD   Hyperlipidemia    Meniere disease, bilateral    Migraine headache    Prediabetes    Tennis elbow    Past Surgical History:  Procedure Laterality Date   BREAST BIOPSY Right 1994   benign   BREAST CYST EXCISION Right    20 years ago   COLONOSCOPY     MENISCUS DEBRIDEMENT Right 2021   Patient Active Problem List   Diagnosis Date Noted   Prediabetes 08/16/2021   Meniere's disease of both ears 12/26/2020   Right epiretinal membrane 07/27/2020   Hearing loss 11/23/2019   Posterior vitreous detachment of right eye 06/16/2019   Posterior vitreous detachment of left eye 06/16/2019   Nuclear sclerotic cataract of both eyes 06/16/2019   Vitreous floaters, right 06/16/2019   Abdominal migraine, not intractable 05/30/2015   Hyperlipemia 08/20/2006   Allergic rhinitis 08/20/2006    REFERRING DIAG: M54.50,G89.29 (ICD-10-CM) - Chronic bilateral low back pain without sciatica  THERAPY DIAG:  Other low back pain  Muscle weakness (generalized)    Rationale for Evaluation and  Treatment: Rehabilitation   THERAPY DIAG:  Other low back pain   Muscle weakness (generalized)   ONSET DATE: March 2023   SUBJECTIVE:                                                                                                                                                                                            SUBJECTIVE STATEMENT: 01/10/2022 States that she feels a  little stiff but didn't do her exercises.    Eval: States that she has had back pain and she thought it was because of a new statin but she stopped taking it. States that she has pain in the morning and she sleeps in different positions and uses pillows for support.  Pain is described as stiffness and is worse with bending over when she ties her shoes she also has pain when sneezing.  Patient is very active works out 2 times a week at [employer] with a trainer and walks 2 to 4 miles a few times a week.  Likes to work in her garden and stay active     PERTINENT HISTORY:  Migraines, Meniere's disease, R knee surgery,    PAIN:  Are you having pain? Yes: NPRS scale: 2 /10 Pain location: low back at L5/S1  Pain description: tightness  Aggravating factors: sitting, AM, bending Relieving factors: unsure   PRECAUTIONS: None   WEIGHT BEARING RESTRICTIONS: No   FALLS:  Has patient fallen in last 6 months? No         PLOF: Independent   PATIENT GOALS: to be able to stay active    NEXT MD VISIT:    OBJECTIVE:    DIAGNOSTIC FINDINGS:  Lumbar xray- mild degeneration in lumbar spine     SCREENING FOR RED FLAGS: Bowel or bladder incontinence: No Spinal tumors: No Cauda equina syndrome: No Compression fracture: No Abdominal aneurysm: No   COGNITION: Overall cognitive status: Within functional limits for tasks assessed                    POSTURE: forward head and posterior pelvic tilt   PALPATION: Tenderness to palpation to right glutes and incre   LUMBAR ROM:    AROM eval  Flexion 50% limited *   Extension 75% limited *  Right lateral flexion 25% limited*  Left lateral flexion 50% limited *  Right rotation    Left rotation     (Blank rows = not tested)             *stiff/pain     LE Measurements       Lower Extremity Right 12/27/2021 Left 12/27/2021    A/PROM MMT A/PROM MMT  Hip Flexion          Hip Extension 10 4+ 5 4+  Hip Abduction          Hip Adduction          Hip Internal rotation   60   45  Hip External rotation   50   45  Knee Flexion          Knee Extension          Ankle Dorsiflexion          Ankle Plantarflexion          Ankle Inversion          Ankle Eversion           (Blank rows = not tested) * pain        TODAY'S TREATMENT:  DATE:   01/10/2022  Therapeutic Exercise:             Aerobic: Supine LTR on ball 2 minutes, hamstring iso 2 minutes, DKC 2 minutes, SAQs 3 minutes  Prone:                Seated:               Standing: Neuromuscular Re-education: seated on ball - head turns, EC, marching - 15 minutes Manual Therapy: lumbar traction on ball PT performed 5 minutes Therapeutic Activity: Self Care: Trigger Point Dry Needling:  Modalities:      PATIENT EDUCATION:  Education details: training goals - things to focus on, general nutrition advice Person educated: Patient Education method: Explanation, Demonstration, and Handouts Education comprehension: verbalized understanding     HOME EXERCISE PROGRAM: [email or web address]   ASSESSMENT:   CLINICAL IMPRESSION: 01/10/2022 Session focused on review of exercises and answering all questions which included general nutrition qeustoins and advice.. Tolerated interventions well with no stiffness noted end of session. Will continue with current POC as tolerated.   Eval: Patient complains of lower back pain that started in March of this year.  Patient presents with limitations in  hip range of motion and strength as well as lumbar range of motion.  Educated patient on current condition and answered all questions about current workout routine, sleeping positions, and general differences between strength training and mobility training.  Patient would greatly benefit from skilled physical therapy at this time to reduce pain and improve overall quality of life.   OBJECTIVE IMPAIRMENTS: decreased ROM, decreased strength, postural dysfunction, and pain.    ACTIVITY LIMITATIONS: bending and sitting   PARTICIPATION LIMITATIONS: driving and yard work   PERSONAL FACTORS: Age are also affecting patient's functional outcome.    REHAB POTENTIAL: Good   CLINICAL DECISION MAKING: Stable/uncomplicated   EVALUATION COMPLEXITY: Low   GOALS: Goals reviewed with patient? yes   SHORT TERM GOALS: Target date: 01/10/2022  Patient will be independent in self management strategies to improve quality of life and functional outcomes. Baseline: New Program Goal status: MET   2.  Patient will report at least 50% improvement in overall symptoms and/or function to demonstrate improved functional mobility Baseline: 0% better Goal status: PROGRESSING   3.  Patient will be able to demonstrate pain-free hip range of motion Baseline: Unable Goal status: MET           LONG TERM GOALS: Target date: 02/21/2022    Patient will report at least 75% improvement in overall symptoms and/or function to demonstrate improved functional mobility Baseline: 0% better Goal status: PROGRESSING   2.  Patient will be able to demonstrate pain-free lumbar range of motion. Baseline: Unable Goal status: PROGRESSING   3.  Patient will be able to wake up in the morning without stiffness after performing basic mobility exercises to improve ability to get out of bed. Baseline: Unable Goal status: PROGRESSING       PLAN:   PT FREQUENCY: 1-2x/week for a total of 12 visits over 12 week certification  period   PT DURATION: 12 weeks   PLANNED INTERVENTIONS: Therapeutic exercises, Therapeutic activity, Neuromuscular re-education, Balance training, Gait training, Patient/Family education, Self Care, Joint mobilization, Joint manipulation, Stair training, Vestibular training, Canalith repositioning, Orthotic/Fit training, DME instructions, Aquatic Therapy, Dry Needling, Electrical stimulation, Spinal manipulation, Spinal mobilization, Cryotherapy, Moist heat, Taping, Traction, Ultrasound, Ionotophoresis 52m/ml Dexamethasone, Manual therapy, and Re-evaluation.   PLAN FOR NEXT SESSION: trial ball exercises with  traction, lumbar/hip mobility, core activation, breath work     10:46 AM, 01/10/22 Jerene Pitch, DPT Physical Therapy with [place]

## 2022-01-15 ENCOUNTER — Other Ambulatory Visit: Payer: Self-pay | Admitting: Obstetrics & Gynecology

## 2022-01-15 DIAGNOSIS — Z1231 Encounter for screening mammogram for malignant neoplasm of breast: Secondary | ICD-10-CM

## 2022-01-16 DIAGNOSIS — F411 Generalized anxiety disorder: Secondary | ICD-10-CM | POA: Diagnosis not present

## 2022-01-17 ENCOUNTER — Ambulatory Visit (INDEPENDENT_AMBULATORY_CARE_PROVIDER_SITE_OTHER): Payer: BC Managed Care – PPO | Admitting: Physical Therapy

## 2022-01-17 ENCOUNTER — Encounter: Payer: Self-pay | Admitting: Physical Therapy

## 2022-01-17 DIAGNOSIS — M5459 Other low back pain: Secondary | ICD-10-CM | POA: Diagnosis not present

## 2022-01-17 DIAGNOSIS — M6281 Muscle weakness (generalized): Secondary | ICD-10-CM | POA: Diagnosis not present

## 2022-01-17 NOTE — Therapy (Signed)
OUTPATIENT PHYSICAL THERAPY TREATMENT NOTE   Patient Name: Heather Chen MRN: [id number] DOB:Apr 02, 1960, 62 y.o., female Today's Date: 01/17/2022  PCP: [name], MD   REFERRING PROVIDER: [name], MD  END OF SESSION:   PT End of Session - 01/17/22 0933     Visit Number 9    Number of Visits 12    Date for PT Re-Evaluation 02/21/22    Authorization Type [employer], VL 30    Authorization - Visit Number 9    Authorization - Number of Visits 30    PT Start Time 0935    PT Stop Time 1013    PT Time Calculation (min) 38 min    Activity Tolerance Patient tolerated treatment well    Behavior During Therapy [facility] for tasks assessed/performed             Past Medical History:  Diagnosis Date   Abdominal migraine, not intractable 05/30/2015   Allergy    SEASONAL   Anemia    GERD (gastroesophageal reflux disease)    H/O seasonal allergies    Heart murmur    AS A CHILD   Hyperlipidemia    Meniere disease, bilateral    Migraine headache    Prediabetes    Tennis elbow    Past Surgical History:  Procedure Laterality Date   BREAST BIOPSY Right 1994   benign   BREAST CYST EXCISION Right    20 years ago   COLONOSCOPY     MENISCUS DEBRIDEMENT Right 2021   Patient Active Problem List   Diagnosis Date Noted   Prediabetes 08/16/2021   Meniere's disease of both ears 12/26/2020   Right epiretinal membrane 07/27/2020   Hearing loss 11/23/2019   Posterior vitreous detachment of right eye 06/16/2019   Posterior vitreous detachment of left eye 06/16/2019   Nuclear sclerotic cataract of both eyes 06/16/2019   Vitreous floaters, right 06/16/2019   Abdominal migraine, not intractable 05/30/2015   Hyperlipemia 08/20/2006   Allergic rhinitis 08/20/2006    REFERRING DIAG: M54.50,G89.29 (ICD-10-CM) - Chronic bilateral low back pain without sciatica  THERAPY DIAG:  Other low back pain  Muscle weakness (generalized)    Rationale for Evaluation and  Treatment: Rehabilitation   THERAPY DIAG:  Other low back pain   Muscle weakness (generalized)   ONSET DATE: March 2023   SUBJECTIVE:                                                                                                                                                                                            SUBJECTIVE STATEMENT: 01/17/2022 States that she has been  staying at her mother in laws as she fell and hit her head so she hasn't been doing her exercises like she was.    Eval: States that she has had back pain and she thought it was because of a new statin but she stopped taking it. States that she has pain in the morning and she sleeps in different positions and uses pillows for support.  Pain is described as stiffness and is worse with bending over when she ties her shoes she also has pain when sneezing.  Patient is very active works out 2 times a week at [employer] with a trainer and walks 2 to 4 miles a few times a week.  Likes to work in her garden and stay active     PERTINENT HISTORY:  Migraines, Meniere's disease, R knee surgery,    PAIN:  Are you having pain? Yes: NPRS scale: 4 /10 Pain location: low back at L5/S1  Pain description: tightness  Aggravating factors: sitting, AM, bending Relieving factors: unsure   PRECAUTIONS: None   WEIGHT BEARING RESTRICTIONS: No   FALLS:  Has patient fallen in last 6 months? No         PLOF: Independent   PATIENT GOALS: to be able to stay active    NEXT MD VISIT:    OBJECTIVE:    DIAGNOSTIC FINDINGS:  Lumbar xray- mild degeneration in lumbar spine     SCREENING FOR RED FLAGS: Bowel or bladder incontinence: No Spinal tumors: No Cauda equina syndrome: No Compression fracture: No Abdominal aneurysm: No   COGNITION: Overall cognitive status: Within functional limits for tasks assessed                    POSTURE: forward head and posterior pelvic tilt   PALPATION: Tenderness to palpation to right  glutes and incre   LUMBAR ROM:    AROM eval  Flexion 50% limited *  Extension 75% limited *  Right lateral flexion 25% limited*  Left lateral flexion 50% limited *  Right rotation    Left rotation     (Blank rows = not tested)             *stiff/pain     LE Measurements       Lower Extremity Right 12/27/2021 Left 12/27/2021    A/PROM MMT A/PROM MMT  Hip Flexion          Hip Extension 10 4+ 5 4+  Hip Abduction          Hip Adduction          Hip Internal rotation   60   45  Hip External rotation   50   45  Knee Flexion          Knee Extension          Ankle Dorsiflexion          Ankle Plantarflexion          Ankle Inversion          Ankle Eversion           (Blank rows = not tested) * pain        TODAY'S TREATMENT:  DATE:   01/17/2022  Therapeutic Exercise:             Aerobic: Supine 90/90 on ball 4 minutes with breathing, LTR on ball 2 minutes, hamstring iso 2 minutes, DKC 2 minutes, SAQs 3 minutes, DKC no ball x10  Prone:   quad trunk twists 3 x5 10" holds B             Seated:               Standing: L stretch at counter x10 20" holds, lumbar extension x10 15-20" holds Neuromuscular Re-education: seated on ball - head turns, EC, marching - 15 minutes Manual Therapy: Therapeutic Activity: Self Care: Trigger Point Dry Needling:  Modalities: thermotherapy to low back during supine interventions      PATIENT EDUCATION:  Education details: HEP, on different types of heat and their benefits Person educated: Patient Education method: Explanation, Demonstration, and Handouts Education comprehension: verbalized understanding     HOME EXERCISE PROGRAM: [id number]   ASSESSMENT:   CLINICAL IMPRESSION: 01/17/2022 Overall tolerated session well. Reviewed exercises and how to perform exercises without ball for when she is traveling/not at  home. Added standing stretches to routine which were all tolerated well. Reduced stiffness noted end of session. Limited trunk rotation noted with new quadruped stretch.  Eval: Patient complains of lower back pain that started in March of this year.  Patient presents with limitations in hip range of motion and strength as well as lumbar range of motion.  Educated patient on current condition and answered all questions about current workout routine, sleeping positions, and general differences between strength training and mobility training.  Patient would greatly benefit from skilled physical therapy at this time to reduce pain and improve overall quality of life.   OBJECTIVE IMPAIRMENTS: decreased ROM, decreased strength, postural dysfunction, and pain.    ACTIVITY LIMITATIONS: bending and sitting   PARTICIPATION LIMITATIONS: driving and yard work   PERSONAL FACTORS: Age are also affecting patient's functional outcome.    REHAB POTENTIAL: Good   CLINICAL DECISION MAKING: Stable/uncomplicated   EVALUATION COMPLEXITY: Low   GOALS: Goals reviewed with patient? yes   SHORT TERM GOALS: Target date: 01/10/2022  Patient will be independent in self management strategies to improve quality of life and functional outcomes. Baseline: New Program Goal status: MET   2.  Patient will report at least 50% improvement in overall symptoms and/or function to demonstrate improved functional mobility Baseline: 0% better Goal status: PROGRESSING   3.  Patient will be able to demonstrate pain-free hip range of motion Baseline: Unable Goal status: MET           LONG TERM GOALS: Target date: 02/21/2022    Patient will report at least 75% improvement in overall symptoms and/or function to demonstrate improved functional mobility Baseline: 0% better Goal status: PROGRESSING   2.  Patient will be able to demonstrate pain-free lumbar range of motion. Baseline: Unable Goal status: PROGRESSING   3.   Patient will be able to wake up in the morning without stiffness after performing basic mobility exercises to improve ability to get out of bed. Baseline: Unable Goal status: PROGRESSING       PLAN:   PT FREQUENCY: 1-2x/week for a total of 12 visits over 12 week certification period   PT DURATION: 12 weeks   PLANNED INTERVENTIONS: Therapeutic exercises, Therapeutic activity, Neuromuscular re-education, Balance training, Gait training, Patient/Family education, Self Care, Joint mobilization, Joint manipulation, Stair training, Vestibular training, Canalith repositioning,  Orthotic/Fit training, DME instructions, Aquatic Therapy, Dry Needling, Electrical stimulation, Spinal manipulation, Spinal mobilization, Cryotherapy, Moist heat, Taping, Traction, Ultrasound, Ionotophoresis 4mg /ml Dexamethasone, Manual therapy, and Re-evaluation.   PLAN FOR NEXT SESSION: trial ball exercises with traction, lumbar/hip mobility, core activation, breath work     10:13 AM, 01/17/22 Jerene Pitch, DPT Physical Therapy with [place]

## 2022-01-30 DIAGNOSIS — F411 Generalized anxiety disorder: Secondary | ICD-10-CM | POA: Diagnosis not present

## 2022-01-31 ENCOUNTER — Encounter: Payer: BC Managed Care – PPO | Admitting: Physical Therapy

## 2022-02-07 ENCOUNTER — Encounter: Payer: Self-pay | Admitting: Physical Therapy

## 2022-02-07 ENCOUNTER — Other Ambulatory Visit: Payer: Self-pay | Admitting: Family Medicine

## 2022-02-07 ENCOUNTER — Encounter: Payer: BC Managed Care – PPO | Admitting: Physical Therapy

## 2022-02-07 ENCOUNTER — Encounter: Payer: Self-pay | Admitting: Internal Medicine

## 2022-02-07 ENCOUNTER — Ambulatory Visit (INDEPENDENT_AMBULATORY_CARE_PROVIDER_SITE_OTHER): Payer: BC Managed Care – PPO | Admitting: Physical Therapy

## 2022-02-07 DIAGNOSIS — M5459 Other low back pain: Secondary | ICD-10-CM

## 2022-02-07 MED ORDER — SIMVASTATIN 40 MG PO TABS: ORAL_TABLET | ORAL | 2 refills | Status: DC

## 2022-02-07 NOTE — Therapy (Signed)
OUTPATIENT PHYSICAL THERAPY TREATMENT NOTE And Discharge Note  PHYSICAL THERAPY DISCHARGE SUMMARY  Visits from Start of Care: 10  Current functional level related to goals / functional outcomes: All but one goal met   Remaining deficits: See below   Education / Equipment: See below   Patient agrees to discharge. Patient goals were partially met. Patient is being discharged due to being pleased with the current functional level.  Progress Note Reporting Period 12/27/21 to 02/07/22  See note below for Objective Data and Assessment of Progress/Goals.      Patient Name: Heather Chen MRN: [id number] DOB:March 17, 1960, 62 y.o., female Today's Date: 02/07/2022  PCP: Tawnya Crook, MD   REFERRING PROVIDER: Tawnya Crook, MD  END OF SESSION:   PT End of Session - 02/07/22 0935     Visit Number 10    Number of Visits 12    Date for PT Re-Evaluation 02/21/22    Authorization Type [employer], VL 30    Authorization - Number of Visits 30    Progress Note Due on Visit 20    PT Start Time 0935    PT Stop Time 1013    PT Time Calculation (min) 38 min    Activity Tolerance Patient tolerated treatment well    Behavior During Therapy [facility] for tasks assessed/performed             Past Medical History:  Diagnosis Date   Abdominal migraine, not intractable 05/30/2015   Allergy    SEASONAL   Anemia    GERD (gastroesophageal reflux disease)    H/O seasonal allergies    Heart murmur    AS A CHILD   Hyperlipidemia    Meniere disease, bilateral    Migraine headache    Prediabetes    Tennis elbow    Past Surgical History:  Procedure Laterality Date   BREAST BIOPSY Right 1994   benign   BREAST CYST EXCISION Right    20 years ago   COLONOSCOPY     MENISCUS DEBRIDEMENT Right 2021   Patient Active Problem List   Diagnosis Date Noted   Prediabetes 08/16/2021   Meniere's disease of both ears 12/26/2020   Right epiretinal membrane 07/27/2020   Hearing  loss 11/23/2019   Posterior vitreous detachment of right eye 06/16/2019   Posterior vitreous detachment of left eye 06/16/2019   Nuclear sclerotic cataract of both eyes 06/16/2019   Vitreous floaters, right 06/16/2019   Abdominal migraine, not intractable 05/30/2015   Hyperlipemia 08/20/2006   Allergic rhinitis 08/20/2006    REFERRING DIAG: M54.50,G89.29 (ICD-10-CM) - Chronic bilateral low back pain without sciatica  THERAPY DIAG:  Other low back pain    Rationale for Evaluation and Treatment: Rehabilitation   THERAPY DIAG:  Other low back pain   Muscle weakness (generalized)   ONSET DATE: March 2023   SUBJECTIVE:  SUBJECTIVE STATEMENT: 02/07/2022 States that she is feeling good. States she didn't have a lot of pain yesterday but wen she went to bend down she felt some tightness. Reports 85% better since the start of PT and performing exercises regular.   Eval: States that she has had back pain and she thought it was because of a new statin but she stopped taking it. States that she has pain in the morning and she sleeps in different positions and uses pillows for support.  Pain is described as stiffness and is worse with bending over when she ties her shoes she also has pain when sneezing.  Patient is very active works out 2 times a week at [employer] with a trainer and walks 2 to 4 miles a few times a week.  Likes to work in her garden and stay active     PERTINENT HISTORY:  Migraines, Meniere's disease, R knee surgery,    PAIN:  Are you having pain? Yes: NPRS scale: 2 /10 Pain location: low back at L5/S1  Pain description: tightness  Aggravating factors: sitting, AM, bending Relieving factors: unsure   PRECAUTIONS: None   WEIGHT BEARING RESTRICTIONS: No   FALLS:  Has patient fallen in  last 6 months? No         PLOF: Independent   PATIENT GOALS: to be able to stay active    NEXT MD VISIT:    OBJECTIVE:    DIAGNOSTIC FINDINGS:  Lumbar xray- mild degeneration in lumbar spine     SCREENING FOR RED FLAGS: Bowel or bladder incontinence: No Spinal tumors: No Cauda equina syndrome: No Compression fracture: No Abdominal aneurysm: No   COGNITION: Overall cognitive status: Within functional limits for tasks assessed                    POSTURE: forward head and posterior pelvic tilt   PALPATION: Tenderness to palpation to right glutes and incre   LUMBAR ROM:    AROM 02/07/22  Flexion 25% limited  Extension 50% limited   Right lateral flexion 25% limited  Left lateral flexion 30% limited   Right rotation    Left rotation     (Blank rows = not tested)             *stiff/pain     LE Measurements       Lower Extremity Right 02/07/22 Left 02/07/22    A/PROM MMT A/PROM MMT  Hip Flexion   5   4+   Hip Extension 15 4+ 10 4+  Hip Abduction          Hip Adduction          Hip Internal rotation   60   50  Hip External rotation   50   45  Knee Flexion          Knee Extension          Ankle Dorsiflexion          Ankle Plantarflexion          Ankle Inversion          Ankle Eversion           (Blank rows = not tested) * pain        TODAY'S TREATMENT:  DATE:   02/07/2022  Therapeutic Exercise:             Aerobic: Supine hip IR alternating 5" holds x15 B, bent knee fall outs x15 5 minutes, piriformis x3 30" holds B IR and ER each, self traction 10 minutes total   Prone:                 Seated:               Standing:   Neuromuscular Re-education:   Therapeutic Activity: Self Care: Trigger Point Dry Needling:  Modalities: thermotherapy to low back during supine interventions      PATIENT EDUCATION:  Education details:  HEP,, reviewed entire HEP and answered all questions Person educated: Patient Education method: Explanation, Demonstration, and Handouts Education comprehension: verbalized understanding     HOME EXERCISE PROGRAM: [id number]   ASSESSMENT:   CLINICAL IMPRESSION: 02/07/2022 Overall all but one goal met at this time. Patient with good mobility and ability to perform exercises independently at home. Reviewed entire program and answered all questions. Patient to discharge from PT to HEP at this time secondary to progress made.   Eval: Patient complains of lower back pain that started in March of this year.  Patient presents with limitations in hip range of motion and strength as well as lumbar range of motion.  Educated patient on current condition and answered all questions about current workout routine, sleeping positions, and general differences between strength training and mobility training.  Patient would greatly benefit from skilled physical therapy at this time to reduce pain and improve overall quality of life.   OBJECTIVE IMPAIRMENTS: decreased ROM, decreased strength, postural dysfunction, and pain.    ACTIVITY LIMITATIONS: bending and sitting   PARTICIPATION LIMITATIONS: driving and yard work   PERSONAL FACTORS: Age are also affecting patient's functional outcome.    REHAB POTENTIAL: Good   CLINICAL DECISION MAKING: Stable/uncomplicated   EVALUATION COMPLEXITY: Low   GOALS: Goals reviewed with patient? yes   SHORT TERM GOALS: Target date: 01/10/2022  Patient will be independent in self management strategies to improve quality of life and functional outcomes. Baseline: New Program Goal status: MET   2.  Patient will report at least 50% improvement in overall symptoms and/or function to demonstrate improved functional mobility Baseline: 0% better Goal status: MET   3.  Patient will be able to demonstrate pain-free hip range of motion Baseline: Unable Goal status: MET            LONG TERM GOALS: Target date: 02/21/2022    Patient will report at least 75% improvement in overall symptoms and/or function to demonstrate improved functional mobility Baseline: 0% better Goal status: MET   2.  Patient will be able to demonstrate pain-free lumbar range of motion. Baseline: Unable Goal status: MET   3.  Patient will be able to wake up in the morning without stiffness after performing basic mobility exercises to improve ability to get out of bed. Baseline: Unable Goal status: PROGRESSING       PLAN:   PT FREQUENCY: 1-2x/week for a total of 12 visits over 12 week certification period   PT DURATION: 12 weeks   PLANNED INTERVENTIONS: Therapeutic exercises, Therapeutic activity, Neuromuscular re-education, Balance training, Gait training, Patient/Family education, Self Care, Joint mobilization, Joint manipulation, Stair training, Vestibular training, Canalith repositioning, Orthotic/Fit training, DME instructions, Aquatic Therapy, Dry Needling, Electrical stimulation, Spinal manipulation, Spinal mobilization, Cryotherapy, Moist heat, Taping, Traction, Ultrasound, Ionotophoresis 4mg /ml Dexamethasone, Manual therapy, and  Re-evaluation.   PLAN FOR NEXT SESSION: DC to HEP   10:06 AM, 02/07/22 Tereasa Coop, DPT Physical Therapy with [facility]

## 2022-03-02 ENCOUNTER — Other Ambulatory Visit: Payer: Self-pay | Admitting: Family Medicine

## 2022-03-05 DIAGNOSIS — F411 Generalized anxiety disorder: Secondary | ICD-10-CM | POA: Diagnosis not present

## 2022-03-06 ENCOUNTER — Ambulatory Visit
Admission: RE | Admit: 2022-03-06 | Discharge: 2022-03-06 | Disposition: A | Discharge status: Home / Self Care | Payer: BC Managed Care – PPO | Source: Ambulatory Visit | Attending: Obstetrics & Gynecology | Admitting: Obstetrics & Gynecology

## 2022-03-06 DIAGNOSIS — Z1231 Encounter for screening mammogram for malignant neoplasm of breast: Secondary | ICD-10-CM

## 2022-03-12 DIAGNOSIS — F411 Generalized anxiety disorder: Secondary | ICD-10-CM | POA: Diagnosis not present

## 2022-03-15 ENCOUNTER — Encounter: Payer: Self-pay | Admitting: Internal Medicine

## 2022-03-15 ENCOUNTER — Ambulatory Visit (AMBULATORY_SURGERY_CENTER): Payer: BC Managed Care – PPO | Admitting: *Deleted

## 2022-03-15 VITALS — Ht 69.0 in | Wt 195.0 lb

## 2022-03-15 DIAGNOSIS — Z1211 Encounter for screening for malignant neoplasm of colon: Secondary | ICD-10-CM

## 2022-03-15 MED ORDER — NA SULFATE-K SULFATE-MG SULF 17.5-3.13-1.6 GM/177ML PO SOLN: 1.0000 | Freq: Once | ORAL | 0 refills | Status: AC

## 2022-03-15 NOTE — Progress Notes (Signed)
No egg or soy allergy known to patient  No issues known to pt with past sedation with any surgeries or procedures Patient denies ever being told they had issues or difficulty with intubation  No FH of Malignant Hyperthermia Pt is not on diet pills Pt is not on  home 02  Pt is not on blood thinners  Pt denies issues with constipation  No A fib or A flutter Have any cardiac testing pending--NO Pt instructed to use Singlecare.com or GoodRx for a price reduction on prep    Patient's chart reviewed by Osvaldo Angst CNRA prior to previsit and patient appropriate for the [place].  Previsit completed and red dot placed by patient's name on their procedure day (on provider's schedule).

## 2022-03-21 DIAGNOSIS — L309 Dermatitis, unspecified: Secondary | ICD-10-CM | POA: Diagnosis not present

## 2022-03-21 DIAGNOSIS — D1723 Benign lipomatous neoplasm of skin and subcutaneous tissue of right leg: Secondary | ICD-10-CM | POA: Diagnosis not present

## 2022-04-04 ENCOUNTER — Telehealth: Payer: Self-pay

## 2022-04-04 ENCOUNTER — Telehealth: Payer: Self-pay | Admitting: *Deleted

## 2022-04-04 ENCOUNTER — Encounter: Payer: Self-pay | Admitting: Internal Medicine

## 2022-04-04 NOTE — Telephone Encounter (Signed)
Patient would like to discuss colonoscopy preparation. Patient states that she had a telephone visit with pre-visit and has some additional questions.

## 2022-04-04 NOTE — Telephone Encounter (Signed)
Spoke with pt regarding questions and prep med changed and instructions reviewed.

## 2022-04-04 NOTE — Telephone Encounter (Signed)
Spoke with pt and Miralax 2 day prep done and reviewed sent by my chart and by mail  with verified address.

## 2022-04-04 NOTE — Telephone Encounter (Signed)
New prep instructions sent by my chart and by mail

## 2022-04-04 NOTE — Telephone Encounter (Signed)
e

## 2022-04-08 ENCOUNTER — Encounter: Payer: Self-pay | Admitting: Family Medicine

## 2022-04-11 ENCOUNTER — Encounter: Payer: Self-pay | Admitting: Internal Medicine

## 2022-04-11 ENCOUNTER — Ambulatory Visit (AMBULATORY_SURGERY_CENTER): Payer: BC Managed Care – PPO | Admitting: Internal Medicine

## 2022-04-11 VITALS — BP 112/66 | HR 65 | Temp 97.5°F | Resp 19 | Ht 69.0 in | Wt 195.0 lb

## 2022-04-11 DIAGNOSIS — Z1211 Encounter for screening for malignant neoplasm of colon: Secondary | ICD-10-CM | POA: Diagnosis not present

## 2022-04-11 MED ORDER — DEXTROSE 5 % IV SOLN
INTRAVENOUS | Status: DC
Start: 2022-04-11 — End: 2022-04-11

## 2022-04-11 NOTE — Patient Instructions (Addendum)
No polyps or cancer were seen.  You also have a condition called diverticulosis - common and not usually a problem. Please read the handout provided.  Next routine colonoscopy or other screening test in 10 years - 2034.  I appreciate the opportunity to care for you. Gatha Mayer, MD, [facility]   Discharge instructions given. Handout on Diverticulosis. Resume previous medications. YOU HAD AN ENDOSCOPIC PROCEDURE TODAY AT [facility]:   Refer to the procedure report that was given to you for any specific questions about what was found during the examination.  If the procedure report does not answer your questions, please call your gastroenterologist to clarify.  If you requested that your care partner not be given the details of your procedure findings, then the procedure report has been included in a sealed envelope for you to review at your convenience later.  YOU SHOULD EXPECT: Some feelings of bloating in the abdomen. Passage of more gas than usual.  Walking can help get rid of the air that was put into your GI tract during the procedure and reduce the bloating. If you had a lower endoscopy (such as a colonoscopy or flexible sigmoidoscopy) you may notice spotting of blood in your stool or on the toilet paper. If you underwent a bowel prep for your procedure, you may not have a normal bowel movement for a few days.  Please Note:  You might notice some irritation and congestion in your nose or some drainage.  This is from the oxygen used during your procedure.  There is no need for concern and it should clear up in a day or so.  SYMPTOMS TO REPORT IMMEDIATELY:  Following lower endoscopy (colonoscopy or flexible sigmoidoscopy):  Excessive amounts of blood in the stool  Significant tenderness or worsening of abdominal pains  Swelling of the abdomen that is new, acute  Fever of 100F or higher   For urgent or emergent issues, a gastroenterologist can be reached at any hour by  calling [phone]. Do not use MyChart messaging for urgent concerns.    DIET:  We do recommend a small meal at first, but then you may proceed to your regular diet.  Drink plenty of fluids but you should avoid alcoholic beverages for 24 hours.  ACTIVITY:  You should plan to take it easy for the rest of today and you should NOT DRIVE or use heavy machinery until tomorrow (because of the sedation medicines used during the test).    FOLLOW UP: Our staff will call the number listed on your records the next business day following your procedure.  We will call around 7:15- 8:00 am to check on you and address any questions or concerns that you may have regarding the information given to you following your procedure. If we do not reach you, we will leave a message.     If any biopsies were taken you will be contacted by phone or by letter within the next 1-3 weeks.  Please call us at [phone] if you have not heard about the biopsies in 3 weeks.    SIGNATURES/CONFIDENTIALITY: You and/or your care partner have signed paperwork which will be entered into your electronic medical record.  These signatures attest to the fact that that the information above on your After Visit Summary has been reviewed and is understood.  Full responsibility of the confidentiality of this discharge information lies with you and/or your care-partner.

## 2022-04-11 NOTE — Progress Notes (Signed)
A and O x3. Report to RN. Tolerated MAC anesthesia well. 

## 2022-04-11 NOTE — Progress Notes (Signed)
Pt requested LR for her fluids upon arrival.  States she was told in PV that we have LR in [place].  She is concerned about the amount of sodium in NS and how that would affect her Menires disease.  Spoke with Dr. Carlean Purl and C. Powers, CRNA and it was decided to use D5.  Discussed with pt and she is agreeable to that.

## 2022-04-11 NOTE — Progress Notes (Signed)
[facility] History and Physical   Primary Care Physician:  Tawnya Crook, MD   Reason for Procedure:   CRCA screening  Plan:    colonoscopy     HPI: Heather Chen is a 62 y.o. female for screening exam   Past Medical History:  Diagnosis Date   Abdominal migraine, not intractable 05/30/2015   Allergy    SEASONAL   Anemia    Cataract    BILATERAL   GERD (gastroesophageal reflux disease)    H/O seasonal allergies    Heart murmur    AS A CHILD   Hyperlipidemia    Meniere disease, bilateral    Migraine headache    Prediabetes    Tennis elbow     Past Surgical History:  Procedure Laterality Date   BREAST BIOPSY Right 1994   benign   BREAST CYST EXCISION Right    20 years ago   COLONOSCOPY     MENISCUS DEBRIDEMENT Right 2021    Prior to Admission medications   Medication Sig Start Date End Date Taking? Authorizing Provider  montelukast (SINGULAIR) 10 MG tablet TAKE 1 TABLET BY MOUTH EVERY DAY 03/03/22  Yes Tawnya Crook, MD  Multiple Vitamin (MULTIVITAMIN PO) Take by mouth.   Yes [provider]  potassium chloride (KLOR-CON M) 10 MEQ tablet Take 1 tablet (10 mEq total) by mouth daily. 12/06/21  Yes Tawnya Crook, MD  Cetirizine HCl (ZYRTEC PO) Take 10 mg by mouth daily.     [provider]  clobetasol cream (TEMOVATE) 0.05 % as needed. Patient not taking: Reported on 03/15/2022    [provider]  desonide (DESOWEN) 0.05 % ointment as needed. Patient not taking: Reported on 03/15/2022    [provider]  diclofenac Sodium (PENNSAID) 2 % SOLN as needed. Patient not taking: Reported on 03/15/2022 06/23/19   [provider]  diphenhydrAMINE HCl (BENADRYL PO) Take by mouth at bedtime.    [provider]  fluocinonide (LIDEX) 0.05 % external solution Apply topically as needed. Patient not taking: Reported on 03/15/2022 09/20/21   [provider]  simvastatin (ZOCOR) 40 MG  tablet TAKE 1 TABLET BY MOUTH EVERYDAY AT BEDTIME 02/07/22   Tawnya Crook, MD  triamterene-hydrochlorothiazide (DYAZIDE) 37.5-25 MG capsule Take 1 tablet by mouth daily. 07/25/21   [provider]  valACYclovir (VALTREX) 500 MG tablet Take 1 tablet (500 mg total) by mouth daily. Patient taking differently: Take 500 mg by mouth as needed. 08/16/21   Tawnya Crook, MD    Current Outpatient Medications  Medication Sig Dispense Refill   montelukast (SINGULAIR) 10 MG tablet TAKE 1 TABLET BY MOUTH EVERY DAY 90 tablet 1   Multiple Vitamin (MULTIVITAMIN PO) Take by mouth.     potassium chloride (KLOR-CON M) 10 MEQ tablet Take 1 tablet (10 mEq total) by mouth daily. 90 tablet 1   Cetirizine HCl (ZYRTEC PO) Take 10 mg by mouth daily.      clobetasol cream (TEMOVATE) 0.05 % as needed. (Patient not taking: Reported on 03/15/2022)     desonide (DESOWEN) 0.05 % ointment as needed. (Patient not taking: Reported on 03/15/2022)     diclofenac Sodium (PENNSAID) 2 % SOLN as needed. (Patient not taking: Reported on 03/15/2022)     diphenhydrAMINE HCl (BENADRYL PO) Take by mouth at bedtime.     fluocinonide (LIDEX) 0.05 % external solution Apply topically as needed. (Patient not taking: Reported on 03/15/2022)     simvastatin (ZOCOR) 40  MG tablet TAKE 1 TABLET BY MOUTH EVERYDAY AT BEDTIME 90 tablet 2   triamterene-hydrochlorothiazide (DYAZIDE) 37.5-25 MG capsule Take 1 tablet by mouth daily.     valACYclovir (VALTREX) 500 MG tablet Take 1 tablet (500 mg total) by mouth daily. (Patient taking differently: Take 500 mg by mouth as needed.) 90 tablet 3   Current Facility-Administered Medications  Medication Dose Route Frequency Provider Last Rate Last Admin   dextrose 5 % solution   Intravenous Continuous Gatha Mayer, MD        Allergies as of 04/11/2022 - Review Complete 04/11/2022  Allergen Reaction Noted   Lipitor [atorvastatin]  05/10/2021   Nickel  12/26/2020   Other  02/17/2019   Formaldehyde  Rash 02/12/2019   Neomycin Rash 06/28/2011    Family History  Problem Relation Age of Onset   Breast cancer Mother 65   Osteoarthritis Mother 23   Rheum arthritis Mother 56       psoriatic arthritis, osteoarthritis   Lymphoma Mother    Dementia Mother    Hearing loss Mother    Leukemia Father 59       deceased   Arthritis Father    Colon polyps Sister    Rheum arthritis Sister    Rheum arthritis Sister    Alcohol abuse Brother    Breast cancer Maternal Aunt        x 2   Breast cancer Maternal Grandmother    Heart disease Paternal Grandmother    Colon cancer Neg Hx    Crohn's disease Neg Hx    Esophageal cancer Neg Hx    Rectal cancer Neg Hx    Stomach cancer Neg Hx    Ulcerative colitis Neg Hx     Social History   Socioeconomic History   Marital status: Married    Spouse name: vernon   Number of children: 0   Years of education: grad schoo   Highest education level: Master's degree (e.g., MA, MS, MEng, MEd, MSW, MBA)  Occupational History   Occupation: Engineer, maintenance (IT)   Tobacco Use   Smoking status: Former    Years: 10    Types: Cigarettes    Quit date: 01/09/1995    Years since quitting: 27.2   Smokeless tobacco: Never  Vaping Use   Vaping Use: Never used  Substance and Sexual Activity   Alcohol use: Yes    Alcohol/week: 5.0 standard drinks of alcohol    Types: 5 Cans of beer per week    Comment: SOCIALLY   Drug use: No   Sexual activity: Not on file  Other Topics Concern   Not on file  Social History Narrative   Work or School: none      Home Situation: lives with husband      Spiritual Beliefs: catholic      Lifestyle: regular exercise- healthy diet but has sweet tooth      Social Determinants of Health   Financial Resource Strain: Low Risk  (05/15/2021)   Overall Financial Resource Strain (CARDIA)    Difficulty of Paying Living Expenses: Not hard at all  Food Insecurity: No Food Insecurity (05/15/2021)   Hunger Vital Sign    Worried About Running Out of  Food in the Last Year: Never true    Ran Out of Food in the Last Year: Never true  Transportation Needs: No Transportation Needs (05/15/2021)   PRAPARE - Hydrologist (Medical): No    Lack of Transportation (Non-Medical): No  Physical Activity: Sufficiently Active (05/15/2021)   Exercise Vital Sign    Days of Exercise per Week: 6 days    Minutes of Exercise per Session: 50 min  Stress: No Stress Concern Present (05/15/2021)   Independence    Feeling of Stress : Only a little  Social Connections: Socially Integrated (05/15/2021)   Social Connection and Isolation Panel [NHANES]    Frequency of Communication with Friends and Family: More than three times a week    Frequency of Social Gatherings with Friends and Family: More than three times a week    Attends Religious Services: More than 4 times per year    Active Member of [employer] or Organizations: Yes    Attends Music therapist: More than 4 times per year    Marital Status: Married  Human resources officer Violence: Not on file    Review of Systems:  All other review of systems negative except as mentioned in the HPI.  Physical Exam: Vital signs BP 105/62   Pulse 70   Temp (!) 97.5 F (36.4 C)   Ht 5\' 9"  (1.753 m)   Wt 195 lb (88.5 kg)   LMP 09/09/2014   SpO2 100%   BMI 28.80 kg/m   General:   Alert,  Well-developed, well-nourished, pleasant and cooperative in NAD Lungs:  Clear throughout to auscultation.   Heart:  Regular rate and rhythm; no murmurs, clicks, rubs,  or gallops. Abdomen:  Soft, nontender and nondistended. Normal bowel sounds.   Neuro/Psych:  Alert and cooperative. Normal mood and affect. A and O x 3   @Elayjah Chaney  Simonne Maffucci, MD, [facility] [phone] (pager) 04/11/2022 9:48 AM@

## 2022-04-11 NOTE — Op Note (Signed)
[facility] Patient Name: Heather Chen Procedure Date: 04/11/2022 9:46 AM MRN: [id number] Endoscopist: Gatha Mayer , MD, [id number] Age: 62 Referring MD:  Date of Birth: 05-Sep-1960 Gender: Female Account #: [id number] Procedure:                Colonoscopy Indications:              Screening for colorectal malignant neoplasm, Last                            colonoscopy: 2014 Medicines:                Monitored Anesthesia Care Procedure:                Pre-Anesthesia Assessment:                           - Prior to the procedure, a History and Physical                            was performed, and patient medications and                            allergies were reviewed. The patient's tolerance of                            previous anesthesia was also reviewed. The risks                            and benefits of the procedure and the sedation                            options and risks were discussed with the patient.                            All questions were answered, and informed consent                            was obtained. Prior Anticoagulants: The patient has                            taken no anticoagulant or antiplatelet agents. ASA                            Grade Assessment: II - A patient with mild systemic                            disease. After reviewing the risks and benefits,                            the patient was deemed in satisfactory condition to                            undergo the procedure.  After obtaining informed consent, the colonoscope                            was passed under direct vision. Throughout the                            procedure, the patient's blood pressure, pulse, and                            oxygen saturations were monitored continuously. The                            CF [id number] was introduced through the anus                            and advanced to the the cecum,  identified by                            appendiceal orifice and ileocecal valve. The                            colonoscopy was performed without difficulty. The                            patient tolerated the procedure well. The quality                            of the bowel preparation was good. The bowel                            preparation used was Miralax via split dose                            instruction. The ileocecal valve, appendiceal                            orifice, and rectum were photographed. Scope In: 9:58:43 AM Scope Out: 10:12:24 AM Scope Withdrawal Time: 0 hours 10 minutes 29 seconds  Total Procedure Duration: 0 hours 13 minutes 41 seconds  Findings:                 The perianal and digital rectal examinations were                            normal.                           Multiple diverticula were found in the sigmoid                            colon.                           The exam was otherwise without abnormality on  direct and retroflexion views. Complications:            No immediate complications. Estimated Blood Loss:     Estimated blood loss was minimal. Impression:               - Diverticulosis in the sigmoid colon.                           - The examination was otherwise normal on direct                            and retroflexion views.                           - No specimens collected. Recommendation:           - Patient has a contact number available for                            emergencies. The signs and symptoms of potential                            delayed complications were discussed with the                            patient. Return to normal activities tomorrow.                            Written discharge instructions were provided to the                            patient.                           - Resume previous diet.                           - Continue present medications.                            - Repeat colonoscopy in 10 years for screening                            purposes. Gatha Mayer, MD 04/11/2022 10:20:11 AM This report has been signed electronically.

## 2022-04-12 ENCOUNTER — Telehealth: Payer: Self-pay

## 2022-04-12 NOTE — Telephone Encounter (Signed)
  Follow up Call-     04/11/2022    9:07 AM  Call back number  Post procedure Call Back phone  # [phone]     Patient questions:  Do you have a fever, pain , or abdominal swelling? No. Pain Score  0 *  Have you tolerated food without any problems? Yes.    Have you been able to return to your normal activities? Yes.    Do you have any questions about your discharge instructions: Diet   No. Medications  No. Follow up visit  No.  Do you have questions or concerns about your Care? No.  Actions: * If pain score is 4 or above: No action needed, pain <4.

## 2022-04-13 NOTE — Telephone Encounter (Signed)
Per chart review this was resolved:  "Darrel Hoover, RN at 04/11/2022  9:30 AM Status: Signed Pt requested LR for her fluids upon arrival.  States she was told in [facility] that we have LR in [facility].  She is concerned about the amount of sodium in NS and how that would affect her Menires disease.  Spoke with Dr. Leone Payor and C. Powers, CRNA and it was decided to use D5.  Discussed with pt and she is agreeable to that"  Called patient to check if she still has any concerns, she confirmed that this was taken care of, and she had her colonoscopy on Wednesday. She will discuss with Dr. Jac Canavan her request for different prep if she needs any procedure in the future. But she said she is good for now.

## 2022-05-03 DIAGNOSIS — F411 Generalized anxiety disorder: Secondary | ICD-10-CM | POA: Diagnosis not present

## 2022-05-10 DIAGNOSIS — F411 Generalized anxiety disorder: Secondary | ICD-10-CM | POA: Diagnosis not present

## 2022-05-17 DIAGNOSIS — F411 Generalized anxiety disorder: Secondary | ICD-10-CM | POA: Diagnosis not present

## 2022-05-24 DIAGNOSIS — F411 Generalized anxiety disorder: Secondary | ICD-10-CM | POA: Diagnosis not present

## 2022-05-31 ENCOUNTER — Other Ambulatory Visit: Payer: Self-pay | Admitting: Family Medicine

## 2022-06-12 DIAGNOSIS — F411 Generalized anxiety disorder: Secondary | ICD-10-CM | POA: Diagnosis not present

## 2022-06-28 DIAGNOSIS — F411 Generalized anxiety disorder: Secondary | ICD-10-CM | POA: Diagnosis not present

## 2022-07-24 DIAGNOSIS — F411 Generalized anxiety disorder: Secondary | ICD-10-CM | POA: Diagnosis not present

## 2022-08-01 DIAGNOSIS — F411 Generalized anxiety disorder: Secondary | ICD-10-CM | POA: Diagnosis not present

## 2022-08-06 DIAGNOSIS — Z6830 Body mass index (BMI) 30.0-30.9, adult: Secondary | ICD-10-CM | POA: Diagnosis not present

## 2022-08-06 DIAGNOSIS — Z124 Encounter for screening for malignant neoplasm of cervix: Secondary | ICD-10-CM | POA: Diagnosis not present

## 2022-08-06 DIAGNOSIS — Z01419 Encounter for gynecological examination (general) (routine) without abnormal findings: Secondary | ICD-10-CM | POA: Diagnosis not present

## 2022-08-08 DIAGNOSIS — G43109 Migraine with aura, not intractable, without status migrainosus: Secondary | ICD-10-CM | POA: Diagnosis not present

## 2022-08-08 DIAGNOSIS — H8102 Meniere's disease, left ear: Secondary | ICD-10-CM | POA: Diagnosis not present

## 2022-08-08 DIAGNOSIS — Z0110 Encounter for examination of ears and hearing without abnormal findings: Secondary | ICD-10-CM | POA: Diagnosis not present

## 2022-08-08 DIAGNOSIS — Z974 Presence of external hearing-aid: Secondary | ICD-10-CM | POA: Diagnosis not present

## 2022-08-08 DIAGNOSIS — H903 Sensorineural hearing loss, bilateral: Secondary | ICD-10-CM | POA: Diagnosis not present

## 2022-08-08 DIAGNOSIS — H9312 Tinnitus, left ear: Secondary | ICD-10-CM | POA: Diagnosis not present

## 2022-08-11 LAB — HM PAP SMEAR: HPV, high-risk: NEGATIVE

## 2022-08-15 DIAGNOSIS — F411 Generalized anxiety disorder: Secondary | ICD-10-CM | POA: Diagnosis not present

## 2022-08-27 ENCOUNTER — Other Ambulatory Visit: Payer: Self-pay | Admitting: Family Medicine

## 2022-08-27 DIAGNOSIS — F411 Generalized anxiety disorder: Secondary | ICD-10-CM | POA: Diagnosis not present

## 2022-08-27 NOTE — Telephone Encounter (Signed)
Needs to sch her f/u appt for nov

## 2022-08-28 NOTE — Telephone Encounter (Signed)
LVM to schedule Follow up in November

## 2022-08-30 ENCOUNTER — Emergency Department (HOSPITAL_BASED_OUTPATIENT_CLINIC_OR_DEPARTMENT_OTHER)
Admission: EM | Admit: 2022-08-30 | Discharge: 2022-08-31 | Disposition: A | Discharge status: Home / Self Care | Payer: BC Managed Care – PPO | Source: Home / Self Care | Attending: Emergency Medicine | Admitting: Emergency Medicine

## 2022-08-30 ENCOUNTER — Encounter (HOSPITAL_BASED_OUTPATIENT_CLINIC_OR_DEPARTMENT_OTHER): Payer: Self-pay | Admitting: Emergency Medicine

## 2022-08-30 DIAGNOSIS — T162XXA Foreign body in left ear, initial encounter: Secondary | ICD-10-CM | POA: Insufficient documentation

## 2022-08-30 DIAGNOSIS — W458XXA Other foreign body or object entering through skin, initial encounter: Secondary | ICD-10-CM | POA: Insufficient documentation

## 2022-08-30 NOTE — ED Triage Notes (Signed)
Pt presents for piece of hearing aide stuck in L ear. Denies pain.

## 2022-08-31 NOTE — ED Notes (Signed)
Pt verbalized understanding of d/c instructions, meds, and followup care. Denies questions. VSS, no distress noted. Steady gait to exit with all belongings.  ?

## 2022-08-31 NOTE — ED Provider Notes (Signed)
Heather Chen Provider Note   CSN: [id number] Arrival date & time: 08/30/22  2316     History  Chief Complaint  Patient presents with   Foreign Body in Ear    Heather Chen Chen Osuch is a 62 y.o. female.  HPI     This is a 62 year old female who presents with foreign body in the left ear.  Patient reports that part of her hearing aid came off in her left ear.  Denies any pain.  Denies any blood or drainage from the ear.  Home Medications Prior to Admission medications   Medication Sig Start Date End Date Taking? Authorizing Provider  Cetirizine HCl (ZYRTEC PO) Take 10 mg by mouth daily.     [provider]  clobetasol cream (TEMOVATE) 0.05 % as needed. Patient not taking: Reported on 03/15/2022    [provider]  desonide (DESOWEN) 0.05 % ointment as needed. Patient not taking: Reported on 03/15/2022    [provider]  diphenhydrAMINE HCl (BENADRYL PO) Take by mouth at bedtime.    [provider]  fluocinonide (LIDEX) 0.05 % external solution Apply topically as needed. Patient not taking: Reported on 03/15/2022 09/20/21   [provider]  montelukast (SINGULAIR) 10 MG tablet TAKE 1 TABLET BY MOUTH EVERY DAY 03/03/22   [name], MD  Multiple Vitamin (MULTIVITAMIN PO) Take by mouth.    [provider]  potassium chloride (KLOR-CON M10) 10 MEQ tablet TAKE 1 TABLET BY MOUTH EVERY DAY 08/27/22   [name], MD  simvastatin (ZOCOR) 40 MG tablet TAKE 1 TABLET BY MOUTH EVERYDAY AT BEDTIME 02/07/22   [name], MD  triamterene-hydrochlorothiazide (DYAZIDE) 37.5-25 MG capsule Take 1 tablet by mouth daily. 07/25/21   [provider]  valACYclovir (VALTREX) 500 MG tablet Take 1 tablet (500 mg total) by mouth daily. Patient taking differently: Take 500 mg by mouth as needed. 08/16/21   [name], MD      Allergies    Lipitor [atorvastatin], Nickel, Other,  Formaldehyde, and Neomycin    Review of Systems   Review of Systems  HENT:  Negative for ear discharge and ear pain.   All other systems reviewed and are negative.   Physical Exam Updated Vital Signs BP (!) 158/90 (BP Location: Right Arm)   Pulse 83   Temp 97.7 F (36.5 C)   Resp 18   LMP 09/09/2014   SpO2 100%  Physical Exam Vitals and nursing note reviewed.  Constitutional:      Appearance: She is well-developed. She is not ill-appearing.  HENT:     Head: Normocephalic and atraumatic.     Ears:     Comments: Foreign body noted at the entrance of the left external auditory canal Eyes:     Pupils: Pupils are equal, round, and reactive to light.  Cardiovascular:     Rate and Rhythm: Normal rate and regular rhythm.  Pulmonary:     Effort: Pulmonary effort is normal. No respiratory distress.  Musculoskeletal:     Cervical back: Neck supple.  Skin:    General: Skin is warm and dry.  Neurological:     Mental Status: She is alert and oriented to person, place, and time.  Psychiatric:        Mood and Affect: Mood normal.     ED Results / Procedures / Treatments   Labs (all labs ordered are listed, but only abnormal results are displayed) Labs Reviewed -  No data to display  EKG None  Radiology No results found.  Procedures .Foreign Body Removal  Date/Time: 08/31/2022 12:15 AM  Performed by: Shon Baton, MD Authorized by: Shon Baton, MD  Consent: Verbal consent obtained. Consent given by: patient Patient identity confirmed: verbally with patient Body area: ear Location details: left ear  Sedation: Patient sedated: no  Patient restrained: no Patient cooperative: yes Localization method: visualized Removal mechanism: curette Complexity: simple 1 objects recovered. Objects recovered: Piece of hearing aid Post-procedure assessment: foreign body removed Patient tolerance: patient tolerated the procedure well with no immediate  complications      Medications Ordered in ED Medications - No data to display  ED Course/ Medical Decision Making/ A&P                                 Medical Decision Making  This patient presents to the ED for concern of foreign body ear, this involves an extensive number of treatment options, and is a complaint that carries with it a high risk of complications and morbidity.  I considered the following differential and admission for this acute, potentially life threatening condition.  The differential diagnosis includes foreign body ear, perforation, trauma  MDM:    This is a 62 year old female who presents with a foreign body in the left ear.  She is overall nontoxic and vital signs are reassuring.  Foreign body easily visualized in the left external auditory canal.  This was removed without difficulty and repeat exam without TM perforation or trauma.  (Labs, imaging, consults)  Labs: I Ordered, and personally interpreted labs.  The pertinent results include: None  Imaging Studies ordered: I ordered imaging studies including none I independently visualized and interpreted imaging. I agree with the radiologist interpretation  Additional history obtained from chart review.  External records from outside source obtained and reviewed including prior evaluations  Cardiac Monitoring: The patient was not maintained on a cardiac monitor.  If on the cardiac monitor, I personally viewed and interpreted the cardiac monitored which showed an underlying rhythm of: N/A  Reevaluation: After the interventions noted above, I reevaluated the patient and found that they have :improved  Social Determinants of Health:  lives independently  Disposition: Discharge  Co morbidities that complicate the patient evaluation  Past Medical History:  Diagnosis Date   Abdominal migraine, not intractable 05/30/2015   Allergy    SEASONAL   Anemia    Cataract    BILATERAL   GERD  (gastroesophageal reflux disease)    H/O seasonal allergies    Heart murmur    AS A CHILD   Hyperlipidemia    Meniere disease, bilateral    Migraine headache    Prediabetes    Tennis elbow      Medicines No orders of the defined types were placed in this encounter.   I have reviewed the patients home medicines and have made adjustments as needed  Problem List / ED Course: Problem List Items Addressed This Visit   None Visit Diagnoses     Foreign body of left ear, initial encounter    -  Primary                   Final Clinical Impression(s) / ED Diagnoses Final diagnoses:  Foreign body of left ear, initial encounter    Rx / DC Orders ED Discharge Orders     None  Shon Baton, MD 08/31/22 [phone]

## 2022-08-31 NOTE — Discharge Instructions (Addendum)
You were seen today for foreign body in the left ear.  This was removed without incident.

## 2022-09-04 DIAGNOSIS — F411 Generalized anxiety disorder: Secondary | ICD-10-CM | POA: Diagnosis not present

## 2022-09-12 DIAGNOSIS — F411 Generalized anxiety disorder: Secondary | ICD-10-CM | POA: Diagnosis not present

## 2022-09-13 ENCOUNTER — Telehealth: Payer: Self-pay

## 2022-09-13 NOTE — Telephone Encounter (Signed)
Transition Care Management Unsuccessful Follow-up Telephone Call  Date of discharge and from where:  [facility] 8/23  Attempts:  1st Attempt  Reason for unsuccessful TCM follow-up call:  No answer/busy   Heather Chen  [facility], [facility] Guide, Phone: [phone] Website: [email or web address]

## 2022-09-13 NOTE — Telephone Encounter (Signed)
Transition Care Management Unsuccessful Follow-up Telephone Call  Date of discharge and from where:  [facility] 8/24  Attempts:  2nd Attempt  Reason for unsuccessful TCM follow-up call:  No answer/busy   Lenard Forth   [facility], [facility] Guide, Phone: [phone] Website: [email or web address]

## 2022-09-18 IMAGING — CT CT CARDIAC CORONARY ARTERY CALCIUM SCORE
3 series · 14 of 20 positions shown, 16 images · non-contrast
Comparison: None.
COMPARISON: None.

Addendum:
EXAM:
OVER-READ INTERPRETATION  CT CHEST

The following report is an over-read performed by radiologist Dr.
Chemelani Junior Mongwa [REDACTED] on 12/14/2020. This
over-read does not include interpretation of cardiac or coronary
anatomy or pathology. The coronary calcium score interpretation by
the cardiologist is attached.
CLINICAL DATA: Cardiovascular Disease Risk stratification
Coronary Calcium Score
TECHNIQUE: A gated, non-contrast computed tomography scan of the heart was
performed using 3mm slice thickness. Axial images were analyzed on a
dedicated workstation. Calcium scoring of the coronary arteries was
performed using the Agatston method.

[Series 2: cascseq 2.0 sa36 70% (id) · axial · 0.39mm/px · z∈[-172,-82]mm · 4 of 76 slices shown]
[im 16/76  vessel]
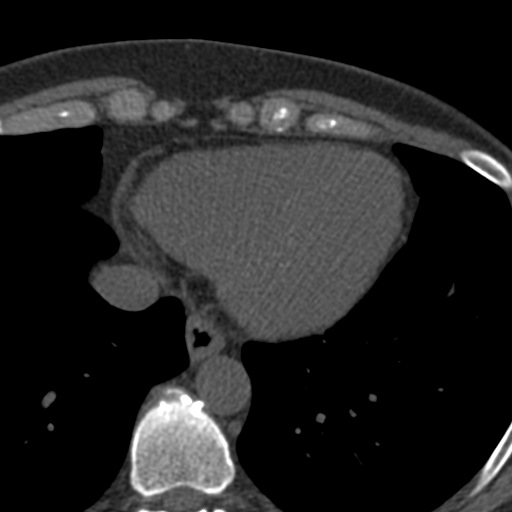
[im 31/76  vessel]
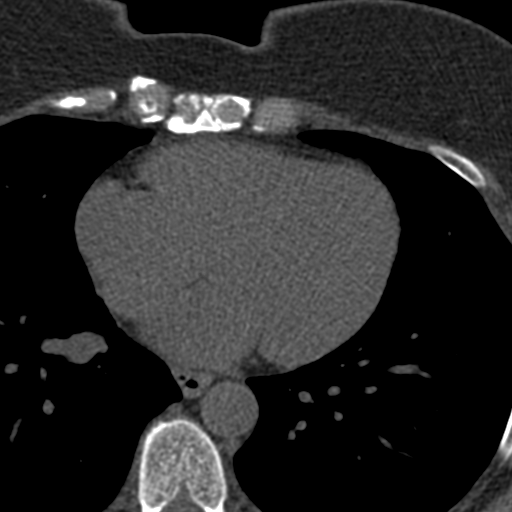
[im 46/76  vessel]
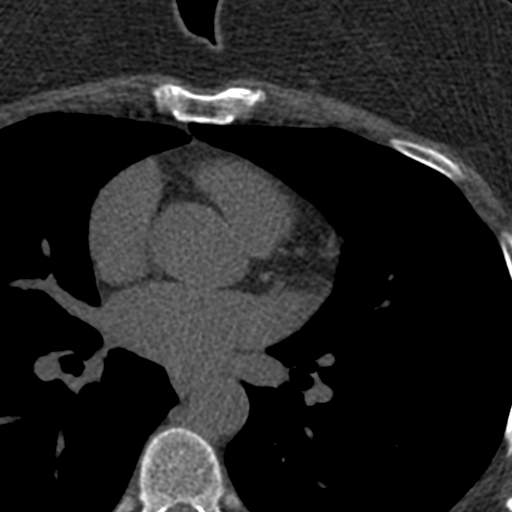
[im 61/76  vessel]
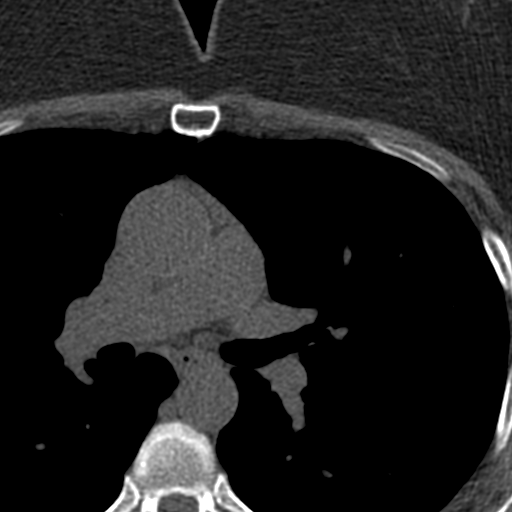

[Series 3: cascseq 2.0 bf37 st · axial · 0.71mm/px · z∈[-178,-78]mm · 5 of 76 slices shown, 7 images]
[im 13/76  vessel]
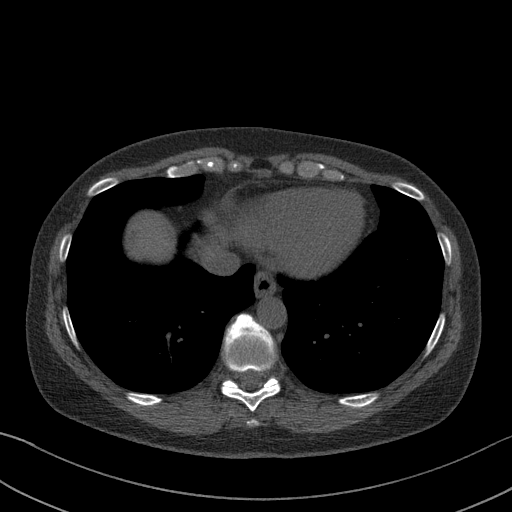
[im 13/76  lung]
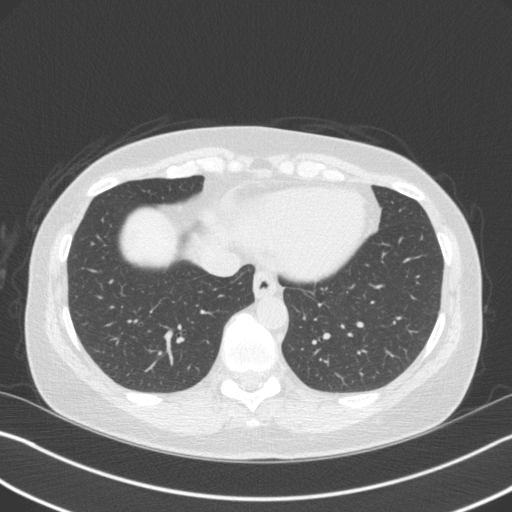
[im 26/76  vessel]
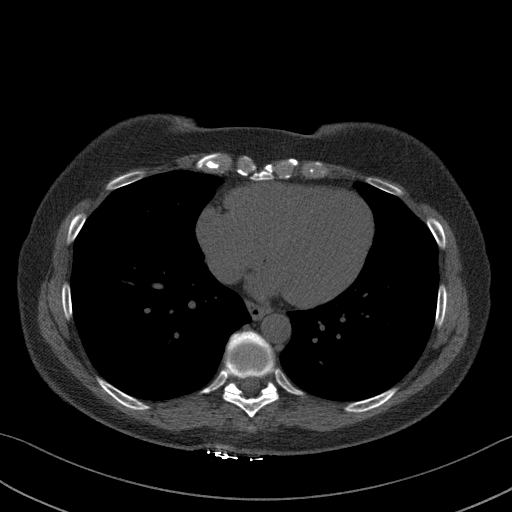
[im 38/76  vessel]
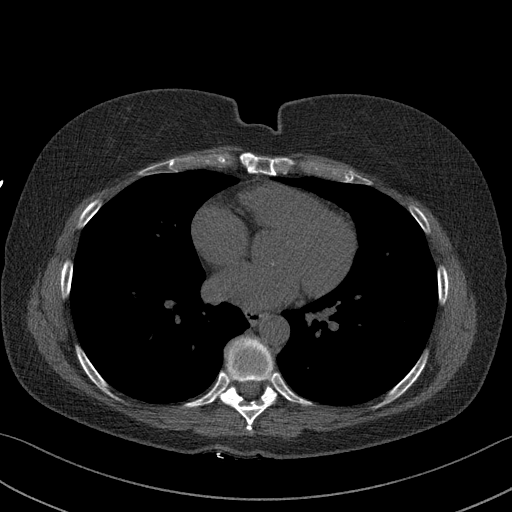
[im 51/76  vessel]
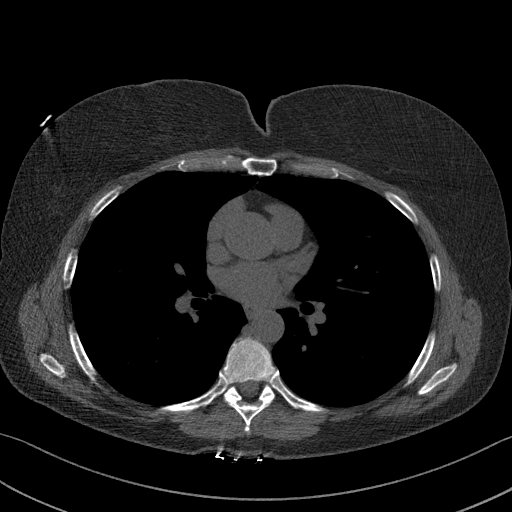
[im 63/76  vessel]
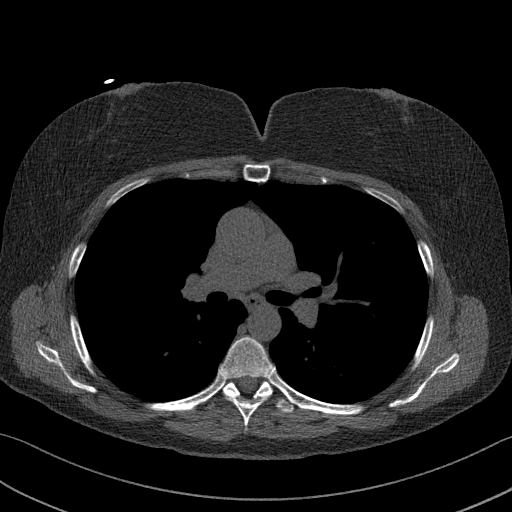
[im 63/76  lung]
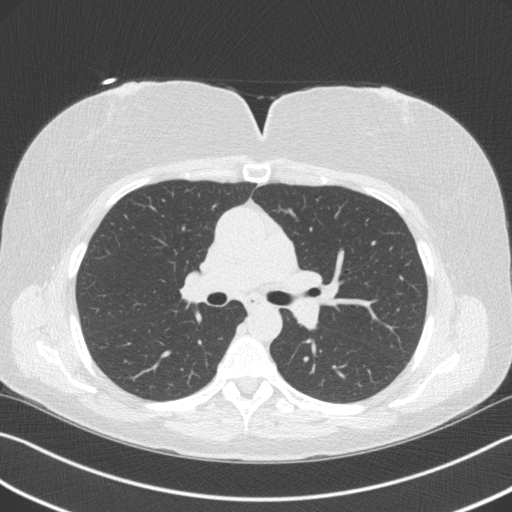

[Series 4: cascseq 2.0 br59 lung · axial · 0.71mm/px · z∈[-178,-78]mm · 5 of 76 slices shown]
[im 13/76  lung]
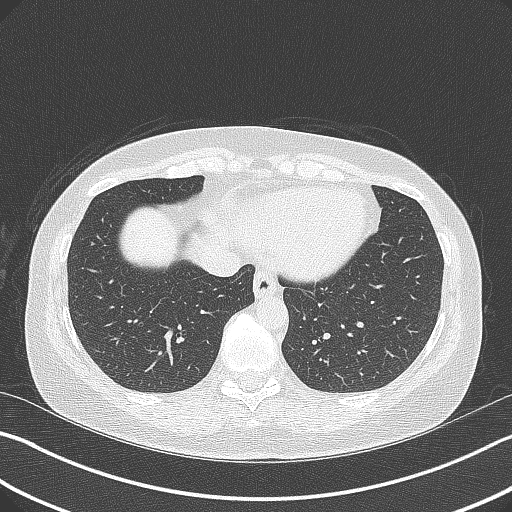
[im 26/76  lung]
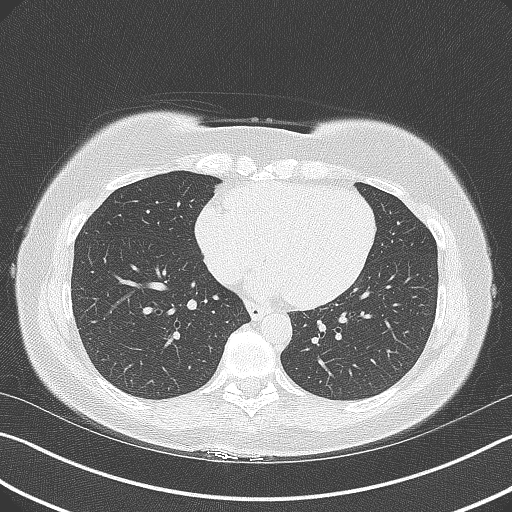
[im 38/76  lung]
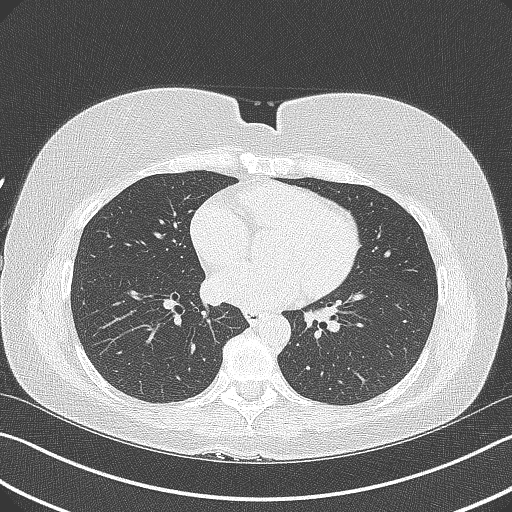
[im 51/76  lung]
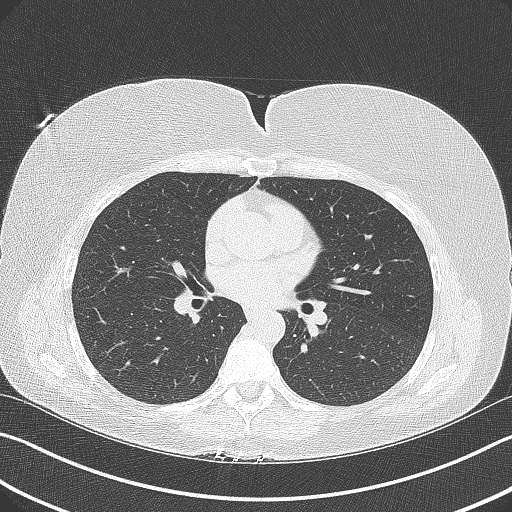
[im 63/76  lung]
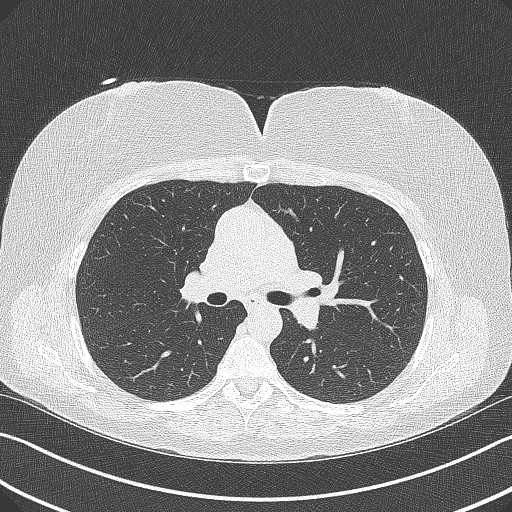

[14 of 20 positions shown; findings below may reference images not displayed]

FINDINGS: Vascular: No incidental noncardiac vascular findings.

Mediastinum/Nodes: Visualized mediastinum and hilar regions
demonstrate no lymphadenopathy or masses.

Lungs/Pleura: There is no evidence of pulmonary edema,
consolidation, pneumothorax, nodule or pleural fluid.

Upper Abdomen: No acute abnormality.

Musculoskeletal: No chest wall mass or suspicious bone lesions
identified.
IMPRESSION: No significant incidental findings.
FINDINGS: Coronary arteries: Normal origins.

Coronary Calcium Score:

Left main: 0

Left anterior descending artery: 0

Left circumflex artery: 0

Right coronary artery: 0

Total: 0

Percentile: 0

Pericardium: Normal.

Aorta: Normal caliber of ascending aorta. No aortic atherosclerosis
noted.

Non-cardiac: See separate report from [REDACTED].
IMPRESSION: Coronary calcium score of 0. This was 0 percentile for age-, race-,
and sex-matched controls.



If CAC=0, it is reasonable to withhold statin therapy and reassess
in 5 to 10 years, as long as higher risk conditions are absent
(diabetes mellitus, family history of premature CHD in first degree
relatives (males <55 years; females <65 years), cigarette smoking,
or LDL >=190 mg/dL).

If CAC is 1 to 99, it is reasonable to initiate statin therapy for
patients >=55 years of age.

If CAC is >=100 or >=75th percentile, it is reasonable to initiate
statin therapy at any age.

Cardiology referral should be considered for patients with CAC
scores >=400 or >=75th percentile.

*8609 AHA/ACC/AACVPR/AAPA/ABC/GUAN/BRASIL/ERVIN/[place]AM/[place]AR[place]
Guideline on the Management of Blood Cholesterol: A Report of the
American College of Cardiology/American Heart Association Task Force
on Clinical Practice Guidelines. J Am Coll Cardiol.
3515;73(24):2416-2368.

*** End of Addendum ***
EXAM:
OVER-READ INTERPRETATION  CT CHEST

The following report is an over-read performed by radiologist Dr.
Chemelani Junior Mongwa [REDACTED] on 12/14/2020. This
over-read does not include interpretation of cardiac or coronary
anatomy or pathology. The coronary calcium score interpretation by
the cardiologist is attached.
FINDINGS: Vascular: No incidental noncardiac vascular findings.

Mediastinum/Nodes: Visualized mediastinum and hilar regions
demonstrate no lymphadenopathy or masses.

Lungs/Pleura: There is no evidence of pulmonary edema,
consolidation, pneumothorax, nodule or pleural fluid.

Upper Abdomen: No acute abnormality.

Musculoskeletal: No chest wall mass or suspicious bone lesions
identified.
IMPRESSION: No significant incidental findings.

## 2022-09-19 ENCOUNTER — Other Ambulatory Visit: Payer: Self-pay | Admitting: Family Medicine

## 2022-09-19 DIAGNOSIS — F411 Generalized anxiety disorder: Secondary | ICD-10-CM | POA: Diagnosis not present

## 2022-09-24 DIAGNOSIS — L820 Inflamed seborrheic keratosis: Secondary | ICD-10-CM | POA: Diagnosis not present

## 2022-09-24 DIAGNOSIS — D225 Melanocytic nevi of trunk: Secondary | ICD-10-CM | POA: Diagnosis not present

## 2022-09-24 DIAGNOSIS — L578 Other skin changes due to chronic exposure to nonionizing radiation: Secondary | ICD-10-CM | POA: Diagnosis not present

## 2022-09-24 DIAGNOSIS — L814 Other melanin hyperpigmentation: Secondary | ICD-10-CM | POA: Diagnosis not present

## 2022-09-24 DIAGNOSIS — L821 Other seborrheic keratosis: Secondary | ICD-10-CM | POA: Diagnosis not present

## 2022-09-27 DIAGNOSIS — H8102 Meniere's disease, left ear: Secondary | ICD-10-CM | POA: Diagnosis not present

## 2022-09-27 DIAGNOSIS — Z79899 Other long term (current) drug therapy: Secondary | ICD-10-CM | POA: Diagnosis not present

## 2022-09-27 DIAGNOSIS — H9313 Tinnitus, bilateral: Secondary | ICD-10-CM | POA: Diagnosis not present

## 2022-09-27 DIAGNOSIS — H903 Sensorineural hearing loss, bilateral: Secondary | ICD-10-CM | POA: Diagnosis not present

## 2022-09-27 DIAGNOSIS — Z974 Presence of external hearing-aid: Secondary | ICD-10-CM | POA: Diagnosis not present

## 2022-09-27 DIAGNOSIS — Z0110 Encounter for examination of ears and hearing without abnormal findings: Secondary | ICD-10-CM | POA: Diagnosis not present

## 2022-09-27 DIAGNOSIS — H9193 Unspecified hearing loss, bilateral: Secondary | ICD-10-CM | POA: Diagnosis not present

## 2022-10-18 ENCOUNTER — Encounter: Payer: Self-pay | Admitting: Family Medicine

## 2022-10-31 DIAGNOSIS — F411 Generalized anxiety disorder: Secondary | ICD-10-CM | POA: Diagnosis not present

## 2022-11-08 ENCOUNTER — Other Ambulatory Visit: Payer: Self-pay | Admitting: Family Medicine

## 2022-11-08 DIAGNOSIS — F411 Generalized anxiety disorder: Secondary | ICD-10-CM | POA: Diagnosis not present

## 2022-11-26 ENCOUNTER — Encounter: Payer: BC Managed Care – PPO | Admitting: Family Medicine

## 2022-12-08 DIAGNOSIS — T162XXA Foreign body in left ear, initial encounter: Secondary | ICD-10-CM | POA: Diagnosis not present

## 2022-12-27 DIAGNOSIS — F411 Generalized anxiety disorder: Secondary | ICD-10-CM | POA: Diagnosis not present

## 2022-12-31 DIAGNOSIS — H5213 Myopia, bilateral: Secondary | ICD-10-CM | POA: Diagnosis not present

## 2022-12-31 DIAGNOSIS — H2513 Age-related nuclear cataract, bilateral: Secondary | ICD-10-CM | POA: Diagnosis not present

## 2022-12-31 DIAGNOSIS — H524 Presbyopia: Secondary | ICD-10-CM | POA: Diagnosis not present

## 2023-01-10 DIAGNOSIS — Z23 Encounter for immunization: Secondary | ICD-10-CM

## 2023-01-17 DIAGNOSIS — F411 Generalized anxiety disorder: Secondary | ICD-10-CM | POA: Diagnosis not present

## 2023-02-08 ENCOUNTER — Other Ambulatory Visit: Payer: Self-pay | Admitting: Obstetrics & Gynecology

## 2023-02-08 DIAGNOSIS — Z1231 Encounter for screening mammogram for malignant neoplasm of breast: Secondary | ICD-10-CM

## 2023-02-11 DIAGNOSIS — F411 Generalized anxiety disorder: Secondary | ICD-10-CM | POA: Diagnosis not present

## 2023-02-15 ENCOUNTER — Other Ambulatory Visit: Payer: Self-pay | Admitting: Family Medicine

## 2023-03-12 ENCOUNTER — Ambulatory Visit
Admission: RE | Admit: 2023-03-12 | Discharge: 2023-03-12 | Disposition: A | Discharge status: Home / Self Care | Payer: BC Managed Care – PPO | Source: Ambulatory Visit | Attending: Obstetrics & Gynecology | Admitting: Obstetrics & Gynecology

## 2023-03-12 DIAGNOSIS — Z1231 Encounter for screening mammogram for malignant neoplasm of breast: Secondary | ICD-10-CM

## 2023-03-13 ENCOUNTER — Encounter: Payer: Self-pay | Admitting: Family Medicine

## 2023-03-13 ENCOUNTER — Ambulatory Visit (INDEPENDENT_AMBULATORY_CARE_PROVIDER_SITE_OTHER): Payer: BC Managed Care – PPO | Admitting: Family Medicine

## 2023-03-13 VITALS — BP 119/75 | HR 71 | Temp 98.2°F | Resp 18 | Ht 69.0 in | Wt 216.2 lb

## 2023-03-13 DIAGNOSIS — E7800 Pure hypercholesterolemia, unspecified: Secondary | ICD-10-CM

## 2023-03-13 DIAGNOSIS — Z0000 Encounter for general adult medical examination without abnormal findings: Secondary | ICD-10-CM

## 2023-03-13 DIAGNOSIS — J301 Allergic rhinitis due to pollen: Secondary | ICD-10-CM

## 2023-03-13 DIAGNOSIS — R7303 Prediabetes: Secondary | ICD-10-CM | POA: Diagnosis not present

## 2023-03-13 DIAGNOSIS — H8103 Meniere's disease, bilateral: Secondary | ICD-10-CM | POA: Diagnosis not present

## 2023-03-13 NOTE — Patient Instructions (Signed)

## 2023-03-13 NOTE — Assessment & Plan Note (Signed)
 Chronic  controlled.  Continue zocor 40mg  daily.  Work on diet/exercise

## 2023-03-13 NOTE — Assessment & Plan Note (Signed)
 Chronic.  More Spring/Fall-continue Montelukast 10mg  daily during seasons

## 2023-03-13 NOTE — Progress Notes (Signed)
 Phone [phone]   Subjective:   Patient is a 63 y.o. female presenting for annual physical.    Chief Complaint  Patient presents with   Annual Exam    CPE Not fasting   Annual-exercises.a lot of stress so not eating as well Allergies-spring and fall-montelukast works well HLD-Zocor 40mg  daily.  No issues Meiner's-weaning dyazide Predm-struggling w/diet d/t a lot going on  See problem oriented charting- ROS- ROS: Gen: no fever, chills  Skin: no rash, itching ENT: no ear pain, ear drainage, nasal congestion, rhinorrhea, sinus pressure, sore throat Eyes: no blurry vision, double vision Resp: no cough, wheeze,SOB CV: no CP, palpitations, LE edema,  GI: no heartburn, n/v/d/c, abd pain GU: no dysuria, urgency, frequency, hematuria MSK: no joint pain, myalgias, back pain.  R knee pain intermitt.  Flared cleaning out shed Neuro: intermitt dizziness, headache Psych: no depression, anxiety, insomnia, SI   stresses +  The following were reviewed and entered/updated in epic: Past Medical History:  Diagnosis Date   Abdominal migraine, not intractable 05/30/2015   Allergy 1993   SEASONAL   Anemia    Cataract 2021/2022   BILATERAL   GERD (gastroesophageal reflux disease)    H/O seasonal allergies    Heart murmur 1965   AS A CHILD   Hyperlipidemia 2008   Meniere disease, bilateral    Migraine headache    Prediabetes    Tennis elbow    Patient Active Problem List   Diagnosis Date Noted   Prediabetes 08/16/2021   Meniere's disease of both ears 12/26/2020   Right epiretinal membrane 07/27/2020   Hearing loss 11/23/2019   Posterior vitreous detachment of right eye 06/16/2019   Posterior vitreous detachment of left eye 06/16/2019   Nuclear sclerotic cataract of both eyes 06/16/2019   Vitreous floaters, right 06/16/2019   Abdominal migraine, not intractable 05/30/2015   Hyperlipemia 08/20/2006   Allergic rhinitis 08/20/2006   Past Surgical History:  Procedure  Laterality Date   BREAST BIOPSY Right 1994   benign   BREAST CYST EXCISION Right    20 years ago   COLONOSCOPY     MENISCUS DEBRIDEMENT Right 2021    Family History  Problem Relation Age of Onset   Breast cancer Mother 57   Osteoarthritis Mother 51   Rheum arthritis Mother 25       psoriatic arthritis, osteoarthritis   Lymphoma Mother    Dementia Mother    Hearing loss Mother    Arthritis Mother    Cancer Mother    Leukemia Father 77       deceased   Arthritis Father    Cancer Father    Early death Father    Hearing loss Father    Colon polyps Sister    Rheum arthritis Sister    Arthritis Sister    Rheum arthritis Sister    Alcohol abuse Brother    Breast cancer Maternal Aunt        x 2   Cancer Maternal Aunt    Breast cancer Maternal Grandmother    Cancer Maternal Grandmother    Stroke Maternal Grandmother    Heart disease Paternal Grandmother    Early death Paternal Grandfather    Cancer Paternal Uncle    Stroke Maternal Uncle    Colon cancer Neg Hx    Crohn's disease Neg Hx    Esophageal cancer Neg Hx    Rectal cancer Neg Hx    Stomach cancer Neg Hx    Ulcerative colitis Neg  Hx     Medications- reviewed and updated Current Outpatient Medications  Medication Sig Dispense Refill   Cetirizine HCl (ZYRTEC PO) Take 10 mg by mouth daily.      clobetasol cream (TEMOVATE) 0.05 % as needed.     desonide (DESOWEN) 0.05 % ointment as needed.     diphenhydrAMINE HCl (BENADRYL PO) Take by mouth at bedtime.     fluocinonide (LIDEX) 0.05 % external solution Apply topically as needed.     KLOR-CON M10 10 MEQ tablet TAKE 1 TABLET BY MOUTH EVERY DAY 90 tablet 0   montelukast (SINGULAIR) 10 MG tablet TAKE 1 TABLET BY MOUTH EVERY DAY 90 tablet 1   Multiple Vitamin (MULTIVITAMIN PO) Take by mouth.     simvastatin (ZOCOR) 40 MG tablet TAKE 1 TABLET BY MOUTH EVERYDAY AT BEDTIME 90 tablet 2   triamterene-hydrochlorothiazide (DYAZIDE) 37.5-25 MG capsule Take 1 tablet by mouth  daily.     valACYclovir (VALTREX) 500 MG tablet TAKE 1 TABLET (500 MG TOTAL) BY MOUTH DAILY. 90 tablet 3   No current facility-administered medications for this visit.    Allergies-reviewed and updated Allergies  Allergen Reactions   Lipitor [Atorvastatin]     Constipation, low back pain   Nickel     Other reaction(s): Other (See Comments)   Other     Nickel and formaldehyde causes rash   Formaldehyde Rash   Neomycin Rash    rash    Social History   Social History Narrative   Work or School: none      Home Situation: lives with husband      Spiritual Beliefs: catholic      Lifestyle: regular exercise- healthy diet but has sweet tooth      Objective  Objective:  BP 119/75   Pulse 71   Temp 98.2 F (36.8 C) (Temporal)   Resp 18   Ht 5\' 9"  (1.753 m)   Wt 216 lb 4 oz (98.1 kg)   LMP 09/09/2014   SpO2 98%   BMI 31.93 kg/m  Physical Exam  Gen: WDWN NAD HEENT: NCAT, conjunctiva not injected, sclera nonicteric TM WNL B, OP moist, no exudates  NECK:  supple, no thyromegaly, no nodes, no carotid bruits CARDIAC: RRR, S1S2+, no murmur. DP 2+B LUNGS: CTAB. No wheezes ABDOMEN:  BS+, soft, NTND, No HSM, no masses EXT:  no edema MSK: no gross abnormalities. MS 5/5 all 4 NEURO: A&O x3.  CN II-XII intact.  PSYCH: normal mood. Good eye contact     Assessment and Plan   Health Maintenance counseling: 1. Anticipatory guidance: Patient counseled regarding regular dental exams q6 months, eye exams,  avoiding smoking and second hand smoke, limiting alcohol to 1 beverage per day, no illicit drugs.   2. Risk factor reduction:  Advised patient of need for regular exercise and diet rich and fruits and vegetables to reduce risk of heart attack and stroke. Exercise- +.  Wt Readings from Last 3 Encounters:  03/13/23 216 lb 4 oz (98.1 kg)  04/11/22 195 lb (88.5 kg)  03/15/22 195 lb (88.5 kg)   3. Immunizations/screenings/ancillary studies Immunization History  Administered  Date(s) Administered   Dtap, Unspecified 10/02/1961, 11/01/1961, 12/07/1961   Fluzone Influenza virus vaccine,trivalent (IIV3), split virus 10/15/2022   Influenza Inj Mdck Quad Pf 10/08/2016, 10/09/2021   Influenza Split 10/11/2013   Influenza Whole 10/11/2006   Influenza, Quadrivalent, Recombinant, Inj, Pf 10/23/2017   Influenza,inj,Quad PF,6+ Mos 09/23/2014, 09/22/2018   Influenza-Unspecified 10/08/2013, 09/09/2014, 10/08/2016, 10/23/2017, 10/23/2019, 10/25/2020,  10/09/2021   Measles 02/15/1982, 08/17/1988   PFIZER(Purple Top)SARS-COV-2 Vaccination 03/28/2019, 04/18/2019, 11/24/2019, 04/19/2020, 10/07/2020   Pfizer Covid-19 Vaccine Bivalent Booster 18yrs & up 11/20/2021   Pfizer(Comirnaty)Fall Seasonal Vaccine 12 years and older 11/07/2022   Polio, Unspecified 08/23/1969, 08/17/1988   Rubella 02/15/1982, 08/17/1988   Td 03/09/1997, 11/03/2008   Td (Adult),unspecified 08/07/1987, 08/13/2000   Tdap 02/25/2019   Zoster Recombinant(Shingrix) 09/29/2018, 12/12/2018   There are no preventive care reminders to display for this patient.  4. Cervical cancer screening- utd 5. Breast cancer screening-  mammogram utd 6. Colon cancer screening - utd 7. Skin cancer screening- advised regular sunscreen use. Denies worrisome, changing, or new skin lesions.  8. Birth control/STD check- n/a 9. Osteoporosis screening- utd 10. Smoking associated screening - former smoker  Wellness examination -     Comprehensive metabolic panel -     CBC with Differential/Platelet -     TSH -     Lipid panel -     Hemoglobin A1c  Pure hypercholesterolemia Assessment & Plan: Chronic  controlled.  Continue zocor 40mg  daily.  Work on diet/exercise  Orders: -     Lipid panel  Meniere's disease of both ears Assessment & Plan: Chronic.  Improved.  Working w/ENT on weaning dyazide   Prediabetes Assessment & Plan: Chronic.  Not doing as well on life style changes as she intended.  Will get  better   Seasonal allergic rhinitis due to pollen Assessment & Plan: Chronic.  More Spring/Fall-continue Montelukast 10mg  daily during seasons    [facility] guidance.  Work on Diet/Exercise  Check CBC,CMP,lipids,TSH, A1C.  F/u 1 yr   Recommended follow up: Return in about 1 year (around 03/12/2024) for annual physical.  Lab/Order associations:non fasting  Angelena Sole, MD

## 2023-03-13 NOTE — Assessment & Plan Note (Signed)
 Chronic.  Improved.  Working w/ENT on weaning dyazide

## 2023-03-13 NOTE — Assessment & Plan Note (Signed)
 Chronic.  Not doing as well on life style changes as she intended.  Will get better

## 2023-03-14 ENCOUNTER — Encounter: Payer: Self-pay | Admitting: Family

## 2023-03-14 LAB — CBC WITH DIFFERENTIAL/PLATELET
Basophils Absolute: 0.1 10*3/uL (ref 0.0–0.1)
Basophils Relative: 1.2 % (ref 0.0–3.0)
Eosinophils Absolute: 0.2 10*3/uL (ref 0.0–0.7)
Eosinophils Relative: 2.2 % (ref 0.0–5.0)
HCT: 40.6 % (ref 36.0–46.0)
Hemoglobin: 13.6 g/dL (ref 12.0–15.0)
Lymphocytes Relative: 29.3 % (ref 12.0–46.0)
Lymphs Abs: 2.4 10*3/uL (ref 0.7–4.0)
MCHC: 33.5 g/dL (ref 30.0–36.0)
MCV: 93.1 fl (ref 78.0–100.0)
Monocytes Absolute: 0.5 10*3/uL (ref 0.1–1.0)
Monocytes Relative: 6.3 % (ref 3.0–12.0)
Neutro Abs: 4.9 10*3/uL (ref 1.4–7.7)
Neutrophils Relative %: 61 % (ref 43.0–77.0)
Platelets: 261 10*3/uL (ref 150.0–400.0)
RBC: 4.36 Mil/uL (ref 3.87–5.11)
RDW: 13.9 % (ref 11.5–15.5)
WBC: 8.1 10*3/uL (ref 4.0–10.5)

## 2023-03-14 LAB — COMPREHENSIVE METABOLIC PANEL
ALT: 16 U/L (ref 0–35)
AST: 20 U/L (ref 0–37)
Albumin: 4.5 g/dL (ref 3.5–5.2)
Alkaline Phosphatase: 62 U/L (ref 39–117)
BUN: 19 mg/dL (ref 6–23)
CO2: 28 meq/L (ref 19–32)
Calcium: 9.6 mg/dL (ref 8.4–10.5)
Chloride: 101 meq/L (ref 96–112)
Creatinine, Ser: 0.87 mg/dL (ref 0.40–1.20)
GFR: 71.78 mL/min (ref >60.00)
Glucose, Bld: 87 mg/dL (ref 70–99)
Potassium: 4 meq/L (ref 3.5–5.1)
Sodium: 138 meq/L (ref 135–145)
Total Bilirubin: 0.5 mg/dL (ref 0.2–1.2)
Total Protein: 7.2 g/dL (ref 6.0–8.3)

## 2023-03-14 LAB — LIPID PANEL
Cholesterol: 214 mg/dL — ABNORMAL HIGH (ref 0–200)
HDL: 57.1 mg/dL (ref >39.00)
LDL Cholesterol: 133 mg/dL — ABNORMAL HIGH (ref 0–99)
NonHDL: 157.08
Total CHOL/HDL Ratio: 4
Triglycerides: 118 mg/dL (ref 0.0–149.0)
VLDL: 23.6 mg/dL (ref 0.0–40.0)

## 2023-03-14 LAB — HEMOGLOBIN A1C: Hgb A1c MFr Bld: 5.9 % (ref 4.6–6.5)

## 2023-03-14 LAB — TSH: TSH: 1.19 u[IU]/mL (ref 0.35–5.50)

## 2023-04-04 DIAGNOSIS — M25561 Pain in right knee: Secondary | ICD-10-CM | POA: Diagnosis not present

## 2023-05-15 ENCOUNTER — Other Ambulatory Visit: Payer: Self-pay | Admitting: Family Medicine

## 2023-05-21 ENCOUNTER — Other Ambulatory Visit: Payer: Self-pay | Admitting: Family Medicine

## 2023-06-13 ENCOUNTER — Ambulatory Visit (INDEPENDENT_AMBULATORY_CARE_PROVIDER_SITE_OTHER): Admitting: Family Medicine

## 2023-06-13 VITALS — BP 130/80 | HR 62 | Temp 97.5°F | Resp 18 | Ht 69.0 in | Wt 223.5 lb

## 2023-06-13 DIAGNOSIS — H9202 Otalgia, left ear: Secondary | ICD-10-CM

## 2023-06-13 MED ORDER — METHYLPREDNISOLONE 4 MG PO TBPK: ORAL_TABLET | ORAL | 0 refills | Status: DC

## 2023-06-13 MED ORDER — AMOXICILLIN 500 MG PO CAPS: 500.0000 mg | ORAL_CAPSULE | Freq: Three times a day (TID) | ORAL | 0 refills | Status: AC

## 2023-06-13 NOTE — Progress Notes (Signed)
 Subjective:     Patient ID: Heather Chen, female    DOB: 01-16-1960, 63 y.o.   MRN: [id number]  Chief Complaint  Patient presents with   Swollen gland    Swollen gland, left jaw line, noticed 1 week ago after cleaning ear with a Q-tip, went away, but then came back No pain, left ear sore when touching   Headache    Slight headache started this morning   Nasal Congestion    Started this morning    HPI Discussed the use of AI scribe software for clinical note transcription with the patient, who gave verbal consent to proceed.  History of Present Illness Heather Chen is a 63 year old female with Meniere's disease who presents with left ear pain and nausea.  About a week ago, she experienced soreness in her left ear while using a Q-tip, which extended along her jawline but resolved after a couple of days. The pain returned yesterday and has progressively worsened, with soreness upon touching inside her ear. She is particularly concerned about her ear condition, especially since her left ear is the worse of the two.  She has a history of eczema behind her ear, which she anticipated would occur after getting hearing aids. The eczema has been present for about two years and tends to migrate to different areas of her body.   This morning, she experienced nausea,. She also reports a headache and some congestion. She has not checked her temperature but was informed she did not have a fever this morning. She occasionally takes Pepto for nausea and has meclizine prescribed for vertigo, which she has not used recently.  She recalls a past medical history involving Meniere's disease, diagnosed in 2021, with symptoms potentially starting in early 2017. She mentions a previous endoscopy in 2016 and a colonoscopy in the fall, with a gastroenterologist noting a family history of Meniere's disease.  She is currently taking hydrochlorothiazide every six days. She has  not taken antibiotics in many years and is only allergic to neomycin.  She mentions stress from packing and moving, which has impacted her ability to work out, although she feels her endurance is good. She is wearing a mask to be cautious about potential contagion.    There are no preventive care reminders to display for this patient.  Past Medical History:  Diagnosis Date   Abdominal migraine, not intractable 05/30/2015   Allergy 1993   SEASONAL   Anemia    Arthritis    typical for age   Cataract 2021/2022   BILATERAL   GERD (gastroesophageal reflux disease)    H/O seasonal allergies    Heart murmur 1965   AS A CHILD   Hyperlipidemia 2008   Meniere disease, bilateral    Migraine headache    Prediabetes    Tennis elbow     Past Surgical History:  Procedure Laterality Date   BREAST BIOPSY Right 1994   benign   BREAST CYST EXCISION Right    20 years ago   COLONOSCOPY     MENISCUS DEBRIDEMENT Right 2021     Current Outpatient Medications:    amoxicillin (AMOXIL) 500 MG capsule, Take 1 capsule (500 mg total) by mouth 3 (three) times daily for 7 days., Disp: 21 capsule, Rfl: 0   Cetirizine HCl (ZYRTEC PO), Take 10 mg by mouth daily. , Disp: , Rfl:    clobetasol cream (TEMOVATE) 0.05 %, as needed., Disp: , Rfl:    desonide (  DESOWEN) 0.05 % ointment, as needed., Disp: , Rfl:    diphenhydrAMINE HCl (BENADRYL PO), Take by mouth at bedtime., Disp: , Rfl:    fluocinonide (LIDEX) 0.05 % external solution, Apply topically as needed., Disp: , Rfl:    methylPREDNISolone  (MEDROL  DOSEPAK) 4 MG TBPK tablet, Please take per packaging instructions., Disp: 21 tablet, Rfl: 0   montelukast (SINGULAIR) 10 MG tablet, TAKE 1 TABLET BY MOUTH EVERY DAY, Disp: 90 tablet, Rfl: 1   Multiple Vitamin (MULTIVITAMIN PO), Take by mouth., Disp: , Rfl:    potassium chloride  (KLOR-CON  M) 10 MEQ tablet, TAKE 1 TABLET BY MOUTH EVERY DAY, Disp: 90 tablet, Rfl: 1   simvastatin  (ZOCOR ) 40 MG tablet, TAKE 1  TABLET BY MOUTH EVERYDAY AT BEDTIME, Disp: 90 tablet, Rfl: 1   valACYclovir  (VALTREX ) 500 MG tablet, TAKE 1 TABLET (500 MG TOTAL) BY MOUTH DAILY., Disp: 90 tablet, Rfl: 3  Allergies  Allergen Reactions   Atorvastatin  Calcium  Other (See Comments)    atorvastatin  calcium    Lipitor [Atorvastatin ]     Constipation, low back pain   Nickel Other (See Comments)    Other reaction(s): Other (See Comments)  nickel sulfate   Other     Nickel and formaldehyde causes rash   Prednisone Other (See Comments)   Formaldehyde Rash and Dermatitis   Neomycin Rash and Dermatitis    rash  neomycin   ROS neg/noncontributory except as noted HPI/below      Objective:      BP 130/80   Pulse 62   Temp (!) 97.5 F (36.4 C) (Temporal)   Resp 18   Ht 5\' 9"  (1.753 m)   Wt 223 lb 8 oz (101.4 kg)   LMP 09/09/2014   SpO2 99%   BMI 33.01 kg/m  Wt Readings from Last 3 Encounters:  06/13/23 223 lb 8 oz (101.4 kg)  03/13/23 216 lb 4 oz (98.1 kg)  04/11/22 195 lb (88.5 kg)    Physical Exam   Gen: WDWN NAD HEENT: NCAT, conjunctiva not injected, sclera nonicteric TM WNL R, L-dull and retracted.  Some mild eczema behind L ear, OP moist, no exudates   sinuses NT NECK:  supple, no thyromegaly, + small tender node near L ear-angle of jaw.  CARDIAC: RRR, S1S2+, no murmur.  LUNGS: CTAB. No wheezes EXT:  no edema MSK: no gross abnormalities.  NEURO: A&O x3.  CN II-XII intact.  PSYCH: normal mood. Good eye contact     Assessment & Plan:  Left ear pain  Other orders -     Amoxicillin; Take 1 capsule (500 mg total) by mouth 3 (three) times daily for 7 days.  Dispense: 21 capsule; Refill: 0 -     methylPREDNISolone ; Please take per packaging instructions.  Dispense: 21 tablet; Refill: 0  Assessment and Plan Assessment & Plan Left Ear Pain   She experiences intermittent left ear pain and soreness along the jawline, initially resolved but now more severe. The eardrum appears dull and retracted,  though no visible infection is present. Differential diagnosis includes viral or sinus infection and Meniere's disease exacerbation. Shingles is unlikely due to vaccination and absence of rash. Amoxicillin are started for potential bacterial infection. Prednisolone is considered instead of prednisone due to past adverse reactions. Monitor for rash development. Consider Medrol  Dosepak if symptoms worsen over the weekend. Use ibuprofen or acetaminophen for pain. Take hydrochlorothiazide daily for a week to manage potential fluid retention.  May also be migraine variant.  Have been seeing other patients  this week w/it.  Meniere's Disease   She has Meniere's disease with balance issues and hearing loss, currently experiencing nausea but no dizziness. Meclizine is available if dizziness occurs. Prednisolone is considered as an alternative to prednisone due to previous adverse reactions. Consider Medrol  Dosepak if symptoms worsen.  Eczema   Chronic eczema behind the ear is exacerbated by hearing aids, with lymph node tenderness possibly related. Continue current eczema management.    Return if symptoms worsen or fail to improve.  Ellsworth Haas, MD

## 2023-06-13 NOTE — Patient Instructions (Signed)
 Take the amoxicillin  If pain worsening-start the medrol   Can take ibu/tylenol.  Take the diuretic daily for 1 week.

## 2023-06-20 DIAGNOSIS — F4322 Adjustment disorder with anxiety: Secondary | ICD-10-CM | POA: Diagnosis not present

## 2023-06-25 DIAGNOSIS — F4322 Adjustment disorder with anxiety: Secondary | ICD-10-CM | POA: Diagnosis not present

## 2023-07-03 DIAGNOSIS — F4322 Adjustment disorder with anxiety: Secondary | ICD-10-CM | POA: Diagnosis not present

## 2023-07-23 DIAGNOSIS — F4322 Adjustment disorder with anxiety: Secondary | ICD-10-CM | POA: Diagnosis not present

## 2023-08-01 ENCOUNTER — Ambulatory Visit: Payer: Self-pay

## 2023-08-01 DIAGNOSIS — F4322 Adjustment disorder with anxiety: Secondary | ICD-10-CM | POA: Diagnosis not present

## 2023-08-01 NOTE — Telephone Encounter (Signed)
 Patient scheduled for 08/02/23 with Alyssa.

## 2023-08-01 NOTE — Telephone Encounter (Signed)
 FYI Only or Action Required?: FYI only for provider.  Patient was last seen in primary care on 06/13/2023 by Wendolyn Jenkins Jansky, MD.  Called Nurse Triage reporting Ear Pain.  Symptoms began several days ago.  Interventions attempted: OTC medications: advil .  Symptoms are: unchanged.  Triage Disposition: See Physician Within 24 Hours  Patient/caregiver understands and will follow disposition?: Yes      Copied from CRM #[id number]. Topic: Clinical - Red Word Triage >> Aug 01, 2023 11:49 AM Franky GRADE wrote: Red Word that prompted transfer to Nurse Triage: Patient is experiencing swollen glands and left ear discomfort, she was seen on 06/13/2023 for the same symptoms and they went away but have returned about 3-4 days ago. Reason for Disposition  Earache  (Exceptions: Brief ear pain of lasting less than 60 minutes, or earache occurring during air travel.)  Answer Assessment - Initial Assessment Questions 1. LOCATION: Which ear is involved?     Left ear 2. ONSET: When did the ear pain start?      3-4 days 3. SEVERITY: How bad is the pain?  (Scale 1-10; mild, moderate or severe)     3/10 4. URI SYMPTOMS: Do you have a runny nose or cough?     Congestion and mild cough 5. FEVER: Do you have a fever? If Yes, ask: What is your temperature, how was it measured, and when did it start?     no 6. CAUSE: Have you been swimming recently?, How often do you use Q-TIPS?, Have you had any recent air travel or scuba diving?    Q tips every morning 7. OTHER SYMPTOMS: Do you have any other symptoms? (e.g., decreased hearing, dizziness, headache, stiff neck, vomiting)     bi  Protocols used: Earache-A-AH

## 2023-08-02 ENCOUNTER — Encounter: Payer: Self-pay | Admitting: Physician Assistant

## 2023-08-02 ENCOUNTER — Ambulatory Visit (INDEPENDENT_AMBULATORY_CARE_PROVIDER_SITE_OTHER): Admitting: Physician Assistant

## 2023-08-02 VITALS — BP 100/60 | HR 81 | Temp 97.9°F | Ht 69.0 in | Wt 224.0 lb

## 2023-08-02 DIAGNOSIS — H9202 Otalgia, left ear: Secondary | ICD-10-CM | POA: Diagnosis not present

## 2023-08-02 MED ORDER — METHYLPREDNISOLONE ACETATE 80 MG/ML IJ SUSP: 80.0000 mg | Freq: Once | INTRAMUSCULAR | Status: DC

## 2023-08-02 MED ORDER — METHYLPREDNISOLONE ACETATE 80 MG/ML IJ SUSP
80.0000 mg | Freq: Once | INTRAMUSCULAR | Status: AC
  Administered 2023-08-02: 80 mg via INTRAMUSCULAR

## 2023-08-02 MED ORDER — CEFDINIR 300 MG PO CAPS: 300.0000 mg | ORAL_CAPSULE | Freq: Two times a day (BID) | ORAL | 0 refills | Status: AC

## 2023-08-02 NOTE — Progress Notes (Signed)
 Patient ID: Heather Chen, female    DOB: 1960-05-08, 63 y.o.   MRN: [id number]   Assessment & Plan:  Acute ear pain, left -     Cefdinir; Take 1 capsule (300 mg total) by mouth 2 (two) times daily for 5 days.  Dispense: 10 capsule; Refill: 0    Assessment & Plan Ear infection Presents with ear pain, congestion, redness, and bulging of the eardrum, indicating a possible ear infection. Symptoms persist despite previous antibiotic treatment. Sinus congestion may contribute to eustachian tube dysfunction, causing fluid retention and pressure on the eardrum. Discussed cefdinir as an alternative to amoxicillin , which may be more effective. Considered steroid use to reduce inflammation but advised to wait unless symptoms persist. Discussed steroid shot, which has fewer side effects than oral steroids. Advised on Sudafed for pressure relief and nasal saline for dryness. - Prescribe omnicef for 5 days - Recommend 3 days of Sudafed - Advise to wait on steroid use unless symptoms persist - Suggest nasal saline for dryness - Consider ENT referral if symptoms persist after treatment  Meniere's disease Meniere's disease increases sensitivity to ear-related symptoms. Considered this condition while assessing the current ear infection and congestion. Advised to continue diuretic use, which may help alleviate some symptoms. - Continue diuretic use as prescribed - F/up with ENT at [facility]        Return if symptoms worsen or fail to improve.    Subjective:    Chief Complaint  Patient presents with   Otalgia    Pt c/o left ear pain x 4-5 days. Has been taking Advil.    HPI Discussed the use of AI scribe software for clinical note transcription with the patient, who gave verbal consent to proceed.  History of Present Illness Heather Chen is a 63 year old female with Meniere's disease who presents with ear pain and congestion.  She experiences ear pain that  differs from her previous episode five to six weeks ago. Previously, pressing on the ear caused pain, but now she feels swelling in the glands. Her head feels congested, similar to allergy symptoms, and she experiences throat clearing without coughing.  She has a history of Meniere's disease, which makes her sensitive to ear issues. She previously saw a physician for similar symptoms and was treated with an antibiotic, which resolved the issue. She still has a steroid from the previous episode but did not use it.  She takes Zyrtec and Singulair for seasonal allergies and one Benadryl at night for eczema. She avoids Flonase due to epistaxis and dryness. She uses saline nasal spray daily to manage dryness.  She is planning to travel to [place]  by car and is concerned about managing her symptoms while away from home.  No current use of nasal sprays like Flonase due to adverse effects. She has not taken Sudafed recently but has no blood pressure issues that would contraindicate its use.  She is prediabetic and her sugar levels are being monitored. She is currently on a diuretic, which she started taking again this week.     Past Medical History:  Diagnosis Date   Abdominal migraine, not intractable 05/30/2015   Allergy 1993   SEASONAL   Anemia    Arthritis    typical for age   Cataract 2021/2022   BILATERAL   GERD (gastroesophageal reflux disease)    H/O seasonal allergies    Heart murmur 1965   AS A CHILD   Hyperlipidemia 2008  Meniere disease, bilateral    Migraine headache    Prediabetes    Tennis elbow     Past Surgical History:  Procedure Laterality Date   BREAST BIOPSY Right 1994   benign   BREAST CYST EXCISION Right    20 years ago   COLONOSCOPY     MENISCUS DEBRIDEMENT Right 2021    Family History  Problem Relation Age of Onset   Breast cancer Mother 68   Osteoarthritis Mother 36   Rheum arthritis Mother 88       psoriatic arthritis, osteoarthritis   Lymphoma  Mother    Dementia Mother    Hearing loss Mother    Arthritis Mother    Cancer Mother    Leukemia Father 46       deceased   Arthritis Father    Cancer Father    Early death Father    Hearing loss Father    Colon polyps Sister    Rheum arthritis Sister    Arthritis Sister    Rheum arthritis Sister    Alcohol abuse Brother    Breast cancer Maternal Aunt        x 2   Cancer Maternal Aunt    Breast cancer Maternal Grandmother    Cancer Maternal Grandmother    Stroke Maternal Grandmother    Heart disease Paternal Grandmother    Early death Paternal Grandfather    Cancer Paternal Uncle    Stroke Maternal Uncle    Colon cancer Neg Hx    Crohn's disease Neg Hx    Esophageal cancer Neg Hx    Rectal cancer Neg Hx    Stomach cancer Neg Hx    Ulcerative colitis Neg Hx     Social History   Tobacco Use   Smoking status: Former    Current packs/day: 0.00    Average packs/day: 0.3 packs/day for 10.0 years (2.5 ttl pk-yrs)    Types: Cigarettes    Start date: 01/08/1985    Quit date: 01/09/1995    Years since quitting: 28.5   Smokeless tobacco: Never  Vaping Use   Vaping status: Never Used  Substance Use Topics   Alcohol use: Yes    Alcohol/week: 7.0 standard drinks of alcohol    Types: 5 Cans of beer, 2 Standard drinks or equivalent per week    Comment: SOCIALLY   Drug use: Never     Allergies  Allergen Reactions   Atorvastatin  Calcium  Other (See Comments)    atorvastatin  calcium    Lipitor [Atorvastatin ]     Constipation, low back pain   Nickel Other (See Comments)    Other reaction(s): Other (See Comments)  nickel sulfate   Other     Nickel and formaldehyde causes rash   Prednisone Other (See Comments)   Formaldehyde Rash and Dermatitis   Neomycin Rash and Dermatitis    rash  neomycin    Review of Systems NEGATIVE UNLESS OTHERWISE INDICATED IN HPI      Objective:     BP 100/60 (BP Location: Left Arm, Patient Position: Sitting, Cuff Size: Large)   Pulse  81   Temp 97.9 F (36.6 C) (Temporal)   Ht 5' 9 (1.753 m)   Wt 224 lb (101.6 kg)   LMP 09/09/2014   SpO2 96%   BMI 33.08 kg/m   Wt Readings from Last 3 Encounters:  08/02/23 224 lb (101.6 kg)  06/13/23 223 lb 8 oz (101.4 kg)  03/13/23 216 lb 4 oz (98.1 kg)  BP Readings from Last 3 Encounters:  08/02/23 100/60  06/13/23 130/80  03/13/23 119/75     Physical Exam Vitals and nursing note reviewed.  Constitutional:      General: She is not in acute distress.    Appearance: Normal appearance. She is not ill-appearing.  HENT:     Head: Normocephalic.     Right Ear: Ear canal and external ear normal. A middle ear effusion is present.     Left Ear: Ear canal and external ear normal. A middle ear effusion is present. Tympanic membrane is erythematous (slight) and bulging (slightly).     Nose: No congestion.     Mouth/Throat:     Mouth: Mucous membranes are moist.     Pharynx: No oropharyngeal exudate or posterior oropharyngeal erythema.  Eyes:     Extraocular Movements: Extraocular movements intact.     Conjunctiva/sclera: Conjunctivae normal.     Pupils: Pupils are equal, round, and reactive to light.  Cardiovascular:     Rate and Rhythm: Normal rate and regular rhythm.     Pulses: Normal pulses.     Heart sounds: Normal heart sounds. No murmur heard. Pulmonary:     Effort: Pulmonary effort is normal. No respiratory distress.     Breath sounds: Normal breath sounds. No wheezing.  Musculoskeletal:     Cervical back: Normal range of motion.  Skin:    General: Skin is warm.  Neurological:     Mental Status: She is alert and oriented to person, place, and time.  Psychiatric:        Mood and Affect: Mood normal.        Behavior: Behavior normal.             Mitzi Lilja M Samanthia Howland, PA-C

## 2023-08-02 NOTE — Addendum Note (Signed)
 Addended by: Amirr Achord on: 08/02/2023 01:45 PM   Modules accepted: Orders, Level of Service

## 2023-08-02 NOTE — Addendum Note (Signed)
 Addended by: THURMON ARLAND PARAS on: 08/02/2023 01:54 PM   Modules accepted: Orders

## 2023-08-14 DIAGNOSIS — H9313 Tinnitus, bilateral: Secondary | ICD-10-CM | POA: Diagnosis not present

## 2023-08-14 DIAGNOSIS — H903 Sensorineural hearing loss, bilateral: Secondary | ICD-10-CM | POA: Diagnosis not present

## 2023-08-14 DIAGNOSIS — H8102 Meniere's disease, left ear: Secondary | ICD-10-CM | POA: Diagnosis not present

## 2023-08-14 DIAGNOSIS — H9212 Otorrhea, left ear: Secondary | ICD-10-CM | POA: Diagnosis not present

## 2023-08-15 DIAGNOSIS — F4322 Adjustment disorder with anxiety: Secondary | ICD-10-CM | POA: Diagnosis not present

## 2023-08-16 DIAGNOSIS — M25511 Pain in right shoulder: Secondary | ICD-10-CM | POA: Diagnosis not present

## 2023-08-28 DIAGNOSIS — Z01419 Encounter for gynecological examination (general) (routine) without abnormal findings: Secondary | ICD-10-CM | POA: Diagnosis not present

## 2023-08-28 DIAGNOSIS — Z124 Encounter for screening for malignant neoplasm of cervix: Secondary | ICD-10-CM | POA: Diagnosis not present

## 2023-08-29 DIAGNOSIS — F4322 Adjustment disorder with anxiety: Secondary | ICD-10-CM | POA: Diagnosis not present

## 2023-09-03 ENCOUNTER — Other Ambulatory Visit: Payer: Self-pay | Admitting: Family Medicine

## 2023-09-03 LAB — HM PAP SMEAR: HPV, high-risk: NEGATIVE

## 2023-09-04 ENCOUNTER — Encounter: Payer: Self-pay | Admitting: Obstetrics & Gynecology

## 2023-09-18 DIAGNOSIS — D225 Melanocytic nevi of trunk: Secondary | ICD-10-CM | POA: Diagnosis not present

## 2023-09-18 DIAGNOSIS — L814 Other melanin hyperpigmentation: Secondary | ICD-10-CM | POA: Diagnosis not present

## 2023-09-18 DIAGNOSIS — L578 Other skin changes due to chronic exposure to nonionizing radiation: Secondary | ICD-10-CM | POA: Diagnosis not present

## 2023-09-18 DIAGNOSIS — L821 Other seborrheic keratosis: Secondary | ICD-10-CM | POA: Diagnosis not present

## 2023-09-26 DIAGNOSIS — H903 Sensorineural hearing loss, bilateral: Secondary | ICD-10-CM | POA: Diagnosis not present

## 2023-09-26 DIAGNOSIS — G43109 Migraine with aura, not intractable, without status migrainosus: Secondary | ICD-10-CM | POA: Diagnosis not present

## 2023-09-26 DIAGNOSIS — H9313 Tinnitus, bilateral: Secondary | ICD-10-CM | POA: Diagnosis not present

## 2023-09-26 DIAGNOSIS — H9113 Presbycusis, bilateral: Secondary | ICD-10-CM | POA: Diagnosis not present

## 2023-09-26 DIAGNOSIS — H8102 Meniere's disease, left ear: Secondary | ICD-10-CM | POA: Diagnosis not present

## 2023-10-13 ENCOUNTER — Other Ambulatory Visit: Payer: Self-pay | Admitting: Family Medicine

## 2023-10-14 DIAGNOSIS — F4322 Adjustment disorder with anxiety: Secondary | ICD-10-CM | POA: Diagnosis not present

## 2023-10-21 DIAGNOSIS — F4322 Adjustment disorder with anxiety: Secondary | ICD-10-CM | POA: Diagnosis not present

## 2023-10-30 DIAGNOSIS — F4322 Adjustment disorder with anxiety: Secondary | ICD-10-CM | POA: Diagnosis not present

## 2023-11-07 DIAGNOSIS — F4322 Adjustment disorder with anxiety: Secondary | ICD-10-CM | POA: Diagnosis not present

## 2023-11-15 ENCOUNTER — Other Ambulatory Visit: Payer: Self-pay

## 2023-11-15 ENCOUNTER — Other Ambulatory Visit: Payer: Self-pay | Admitting: Family Medicine

## 2023-11-15 DIAGNOSIS — E7800 Pure hypercholesterolemia, unspecified: Secondary | ICD-10-CM

## 2023-11-15 MED ORDER — SIMVASTATIN 40 MG PO TABS: ORAL_TABLET | ORAL | 0 refills | Status: AC

## 2023-11-18 DIAGNOSIS — M25511 Pain in right shoulder: Secondary | ICD-10-CM | POA: Diagnosis not present

## 2023-11-25 DIAGNOSIS — F4322 Adjustment disorder with anxiety: Secondary | ICD-10-CM | POA: Diagnosis not present

## 2023-12-10 DIAGNOSIS — F4322 Adjustment disorder with anxiety: Secondary | ICD-10-CM | POA: Diagnosis not present

## 2023-12-12 DIAGNOSIS — M25511 Pain in right shoulder: Secondary | ICD-10-CM | POA: Diagnosis not present

## 2023-12-23 DIAGNOSIS — M25811 Other specified joint disorders, right shoulder: Secondary | ICD-10-CM | POA: Diagnosis not present

## 2023-12-25 DIAGNOSIS — M25811 Other specified joint disorders, right shoulder: Secondary | ICD-10-CM | POA: Diagnosis not present

## 2023-12-27 ENCOUNTER — Other Ambulatory Visit: Payer: Self-pay | Admitting: Obstetrics & Gynecology

## 2023-12-27 DIAGNOSIS — Z803 Family history of malignant neoplasm of breast: Secondary | ICD-10-CM

## 2024-01-10 ENCOUNTER — Ambulatory Visit: Admitting: Family Medicine

## 2024-01-10 ENCOUNTER — Encounter: Payer: Self-pay | Admitting: Family Medicine

## 2024-01-10 VITALS — BP 132/78 | HR 69 | Temp 97.2°F | Ht 69.0 in | Wt 225.0 lb

## 2024-01-10 DIAGNOSIS — E66811 Obesity, class 1: Secondary | ICD-10-CM

## 2024-01-10 DIAGNOSIS — R7303 Prediabetes: Secondary | ICD-10-CM

## 2024-01-10 DIAGNOSIS — E6609 Other obesity due to excess calories: Secondary | ICD-10-CM

## 2024-01-10 DIAGNOSIS — Z6833 Body mass index (BMI) 33.0-33.9, adult: Secondary | ICD-10-CM

## 2024-01-10 DIAGNOSIS — R221 Localized swelling, mass and lump, neck: Secondary | ICD-10-CM | POA: Diagnosis not present

## 2024-01-10 DIAGNOSIS — Z23 Encounter for immunization: Secondary | ICD-10-CM

## 2024-01-10 LAB — CBC WITH DIFFERENTIAL/PLATELET
Basophils Absolute: 0 K/uL (ref 0.0–0.1)
Basophils Relative: 0.5 % (ref 0.0–3.0)
Eosinophils Absolute: 0.2 K/uL (ref 0.0–0.7)
Eosinophils Relative: 2.8 % (ref 0.0–5.0)
HCT: 40.7 % (ref 36.0–46.0)
Hemoglobin: 13.4 g/dL (ref 12.0–15.0)
Lymphocytes Relative: 25.8 % (ref 12.0–46.0)
Lymphs Abs: 2.1 K/uL (ref 0.7–4.0)
MCHC: 33 g/dL (ref 30.0–36.0)
MCV: 90.8 fl (ref 78.0–100.0)
Monocytes Absolute: 0.4 K/uL (ref 0.1–1.0)
Monocytes Relative: 5 % (ref 3.0–12.0)
Neutro Abs: 5.2 K/uL (ref 1.4–7.7)
Neutrophils Relative %: 65.9 % (ref 43.0–77.0)
Platelets: 254 K/uL (ref 150.0–400.0)
RBC: 4.48 Mil/uL (ref 3.87–5.11)
RDW: 13.6 % (ref 11.5–15.5)
WBC: 8 K/uL (ref 4.0–10.5)

## 2024-01-10 LAB — COMPREHENSIVE METABOLIC PANEL WITH GFR
ALT: 30 U/L (ref 3–35)
AST: 26 U/L (ref 5–37)
Albumin: 4.5 g/dL (ref 3.5–5.2)
Alkaline Phosphatase: 71 U/L (ref 39–117)
BUN: 15 mg/dL (ref 6–23)
CO2: 28 meq/L (ref 19–32)
Calcium: 9 mg/dL (ref 8.4–10.5)
Chloride: 102 meq/L (ref 96–112)
Creatinine, Ser: 0.85 mg/dL (ref 0.40–1.20)
GFR: 73.38 mL/min
Glucose, Bld: 85 mg/dL (ref 70–99)
Potassium: 4.1 meq/L (ref 3.5–5.1)
Sodium: 139 meq/L (ref 135–145)
Total Bilirubin: 0.4 mg/dL (ref 0.2–1.2)
Total Protein: 7.1 g/dL (ref 6.0–8.3)

## 2024-01-10 NOTE — Progress Notes (Unsigned)
 "  Subjective:     Patient ID: Heather Chen, female    DOB: [birth year], 64 y.o.   MRN: [id number]  Chief Complaint  Patient presents with   Obesity    Wants to see if she is eligible for weight loss meds. Also has a swollen glad on the back left side of neck. Over Thanksgiving she found out that her sisters are getting twice a year MRI for breast cancer screening because of Major family history. Gyn declined extra monitoring.     Discussed the use of AI scribe software for clinical note transcription with the patient, who gave verbal consent to proceed.  History of Present Illness Heather Chen is a 64 year old female who presents for discussion of weight loss medications and evaluation of a persistent swollen gland in the neck.  She has a persistent swollen gland in the back of her neck, noticed a few weeks ago. The gland is not painful and was discovered incidentally. She has a history of eczema, which causes frequent scalp scratching, but no recent flares. The gland has remained the same size for a month, and she has not been actively monitoring it.  She is interested in discussing weight loss medications. She attributes her weight gain to stress eating and lack of consistent exercise after her trainer moved away. She has joined a [employer] to resume her exercise routine. Her sister had success with weight loss medication, and she is considering this option to aid her weight loss efforts. Willing to pay cash if ins not cover  She has a significant family history of breast cancer, with her mother, two sisters, and maternal grandmother all having had postmenopausal breast cancer. She recently learned that two of her sisters have been getting MRI mammograms alternating with regular mammograms every six months. She has been getting a 3D mammogram annually and has a mammogram scheduled for the end of January. She tested negative for the BRCA gene but is concerned  about her family history, especially after her 35 year old niece was diagnosed with breast cancer and is currently undergoing treatment.  No history of heart disease or chest pain with exertion. She can climb a flight of stairs without difficulty but experiences shortness of breath when walking more than a couple of miles, which she attributes to recent weight gain. She has been physically active, working hard on some days without significant issues, although she feels more tired than usual. May be undergoing ortho surgery in near future    Health Maintenance Due  Topic Date Due   Pneumococcal Vaccine: 50+ Years (1 of 1 - PCV) Never done    Past Medical History:  Diagnosis Date   Abdominal migraine, not intractable 05/30/2015   Allergy 1993   SEASONAL   Anemia    Arthritis    typical for age   Cataract 2021/2022   BILATERAL   GERD (gastroesophageal reflux disease)    H/O seasonal allergies    Heart murmur 1965   AS A CHILD   Hyperlipidemia 2008   Meniere disease, bilateral    Migraine headache    Prediabetes    Tennis elbow     Past Surgical History:  Procedure Laterality Date   BREAST BIOPSY Right 1994   benign   BREAST CYST EXCISION Right    20 years ago   COLONOSCOPY     MENISCUS DEBRIDEMENT Right 2021    Current Medications[1]  Allergies[2] ROS neg/noncontributory except as noted HPI/below  Objective:     BP 132/78 (BP Location: Left Arm, Cuff Size: Large)   Pulse 69   Temp (!) 97.2 F (36.2 C) (Temporal)   Ht 5' 9 (1.753 m)   Wt 225 lb (102.1 kg)   LMP 09/09/2014   SpO2 97%   BMI 33.23 kg/m  Wt Readings from Last 3 Encounters:  01/10/24 225 lb (102.1 kg)  08/02/23 224 lb (101.6 kg)  06/13/23 223 lb 8 oz (101.4 kg)    Physical Exam VITALS: BP- 132/78 GENERAL: Well developed, well nourished, no acute distress. HEAD EYES EARS NOSE THROAT: Normocephalic, atraumatic, conjunctiva not injected, sclera nonicteric. CARDIAC: Regular rate and  rhythm, S1 S2 present, no murmur, dorsalis pedis 2 plus bilaterally, heart sounds normal. NECK: Supple, no thyromegaly, no nodes, no carotid bruits, lump on the left side of posterior neck, closer to the spine, less than a centimeter, slightly movable. Under skin.  Feels more like lipoma LUNGS: Clear to auscultation bilaterally, no wheezes. ABDOMEN: Bowel sounds present, soft, non-tender, non-distended, no hepatosplenomegaly, no masses. EXTREMITIES: No edema. MUSCULOSKELETAL: No gross abnormalities. NEUROLOGICAL: Alert and oriented x3, cranial nerves II through XII intact. PSYCHIATRIC: Normal mood, good eye contact.       Assessment & Plan:  Mass of left side of neck -     CBC with Differential/Platelet -     Comprehensive metabolic panel with GFR -     CT SOFT TISSUE NECK W CONTRAST; Future  Prediabetes  Class 1 obesity due to excess calories with serious comorbidity and body mass index (BMI) of 33.0 to 33.9 in adult  Immunization due -     Pneumococcal conjugate vaccine 20-valent    Assessment and Plan Assessment & Plan Obesity   BMI is over 30, qualifying her for weight loss medication, though insurance coverage is uncertain. Discussed GLP-1 receptor agonists, Wegovy and Zepbound , for weight loss, including side effects like nausea, constipation, and potential gallbladder dysfunction. Emphasized dietary adjustments and exercise, and discussed potential long-term use and weight regain post-medication. Considered cost and insurance options, including copay cards and direct purchase from manufacturers. Check insurance coverage for [facility] and Zepbound . Consider copay cards for medications. Prioritize protein and vegetable intake. Monitor for side effects such as nausea and constipation. Adjust medication dosage as needed based on tolerance and weight loss. Encourage regular exercise and dietary adjustments.  Mass of left side of neck   A persistent mass on the left side of the neck has  been present for at least a month, measuring less than a centimeter, movable, and not associated with significant eczema. Differential diagnosis includes lipoma versus lymph node enlargement. Discussed the potential for a CT scan with contrast to further evaluate the mass. Ordered a CT scan of the neck with contrast and a CBC to evaluate blood counts.  Complete rotator cuff tear of right shoulder   She has a complete rotator cuff tear in the right shoulder and is currently undergoing physical therapy. Discussed the potential need for surgical intervention if physical therapy is insufficient. Continue physical therapy for the right shoulder rotator cuff tear. Cleared medically for surgery  preDM-needs to work on diet/exercise     Return for as sch in March.  Jenkins CHRISTELLA Carrel, MD     [1]  Current Outpatient Medications:    Cetirizine HCl (ZYRTEC PO), Take 10 mg by mouth daily. , Disp: , Rfl:    clobetasol cream (TEMOVATE) 0.05 %, as needed., Disp: , Rfl:    desonide (DESOWEN) 0.05 %  ointment, as needed., Disp: , Rfl:    fluocinonide (LIDEX) 0.05 % external solution, Apply topically as needed., Disp: , Rfl:    montelukast (SINGULAIR) 10 MG tablet, TAKE 1 TABLET BY MOUTH EVERY DAY, Disp: 90 tablet, Rfl: 1   Multiple Vitamin (MULTIVITAMIN PO), Take by mouth., Disp: , Rfl:    potassium chloride  (KLOR-CON  M) 10 MEQ tablet, TAKE 1 TABLET BY MOUTH EVERY DAY (Patient taking differently: Take 10 mEq by mouth 4 (four) times a week. Every other day.), Disp: 90 tablet, Rfl: 1   simvastatin  (ZOCOR ) 40 MG tablet, TAKE 1 TABLET BY MOUTH EVERYDAY AT BEDTIME, Disp: 30 tablet, Rfl: 0   SUNSCREENS EX, apply topically to face and body daily; Duration: 30, Disp: , Rfl:    valACYclovir  (VALTREX ) 500 MG tablet, TAKE 1 TABLET (500 MG TOTAL) BY MOUTH DAILY., Disp: 90 tablet, Rfl: 3 [2]  Allergies Allergen Reactions   Atorvastatin  Other (See Comments)    Constipation, low back pain  Other Reaction(s): Other (See  Comments)    atorvastatin  calcium   atorvastatin    Atorvastatin  Calcium  Other (See Comments)    atorvastatin  calcium    Nickel Other (See Comments)    Other reaction(s): Other (See Comments)  nickel sulfate   Other     Nickel and formaldehyde causes rash   Prednisone Other (See Comments)   Formaldehyde Dermatitis and Rash   Neomycin Rash and Dermatitis    rash  neomycin   "

## 2024-01-10 NOTE — Patient Instructions (Signed)
It was very nice to see you today!  Wegovy or zepbound.    PLEASE NOTE:  If you had any lab tests please let us know if you have not heard back within a few days. You may see your results on MyChart before we have a chance to review them but we will give you a call once they are reviewed by [place]. If we ordered any referrals today, please let us know if you have not heard from their office within the next week.   Please try these tips to maintain a healthy lifestyle:  Eat most of your calories during the day when you are active. Eliminate processed foods including packaged sweets (pies, cakes, cookies), reduce intake of potatoes, white bread, white pasta, and white rice. Look for whole grain options, oat flour or almond flour.  Each meal should contain half fruits/vegetables, one quarter protein, and one quarter carbs (no bigger than a computer mouse).  Cut down on sweet beverages. This includes juice, soda, and sweet tea. Also watch fruit intake, though this is a healthier sweet option, it still contains natural sugar! Limit to 3 servings daily.  Drink at least 1 glass of water with each meal and aim for at least 8 glasses per day  Exercise at least 150 minutes every week.

## 2024-01-11 MED ORDER — ZEPBOUND 2.5 MG/0.5ML ~~LOC~~ SOAJ: 2.5000 mg | SUBCUTANEOUS | 0 refills | Status: AC

## 2024-01-12 ENCOUNTER — Ambulatory Visit: Payer: Self-pay | Admitting: Family Medicine

## 2024-01-12 NOTE — Progress Notes (Signed)
"  Labs ok   "

## 2024-01-13 ENCOUNTER — Encounter: Payer: Self-pay | Admitting: Family Medicine

## 2024-01-13 NOTE — Progress Notes (Signed)
Pt has read results.

## 2024-01-15 ENCOUNTER — Other Ambulatory Visit

## 2024-01-15 ENCOUNTER — Ambulatory Visit
Admission: RE | Admit: 2024-01-15 | Discharge: 2024-01-15 | Disposition: A | Discharge status: Home / Self Care | Source: Ambulatory Visit | Attending: Family Medicine | Admitting: Family Medicine

## 2024-01-15 ENCOUNTER — Other Ambulatory Visit (HOSPITAL_COMMUNITY): Payer: Self-pay

## 2024-01-15 ENCOUNTER — Encounter: Payer: Self-pay | Admitting: Radiology

## 2024-01-15 ENCOUNTER — Telehealth: Payer: Self-pay

## 2024-01-15 DIAGNOSIS — R221 Localized swelling, mass and lump, neck: Secondary | ICD-10-CM

## 2024-01-15 MED ORDER — IOPAMIDOL (ISOVUE-300) INJECTION 61%
75.0000 mL | Freq: Once | INTRAVENOUS | Status: AC | PRN
  Administered 2024-01-15: 75 mL via INTRAVENOUS

## 2024-01-15 NOTE — Progress Notes (Signed)
 Called pt and notified.

## 2024-01-15 NOTE — Progress Notes (Signed)
 Good news, just a lipoma(fatty thing) For the sinuses-incidental so since no symptoms, nothing to do

## 2024-01-15 NOTE — Telephone Encounter (Signed)
 Pharmacy Patient Advocate Encounter   Received notification from [employer]base CMM KEY that prior authorization for Zepbound  2.5MG /0.5ML pen-injectors is required/requested.   Insurance verification completed.   The patient is insured through [facility].   Per test claim: PA required; PA submitted to above mentioned insurance via Latent Key/confirmation #/EOC [id number] Status is pending

## 2024-01-24 ENCOUNTER — Other Ambulatory Visit (HOSPITAL_COMMUNITY): Payer: Self-pay

## 2024-01-24 NOTE — Telephone Encounter (Signed)
 Additional information has been requested from the patient's insurance in order to proceed with the prior authorization request. Requested information has been sent, or form has been filled out and faxed back to [phone]  Case Key Number: [id number]  Phone# [phone] option 3, option 4, option 2

## 2024-01-24 NOTE — Telephone Encounter (Signed)
 Pharmacy Patient Advocate Encounter  Received notification from [facility] that Prior Authorization for Zepbound  2.5MG /0.5ML pen-injectors  has been DENIED.  Full denial letter will be uploaded to the media tab. See denial reason below.   PA #/Case ID/Reference #: [id number] .

## 2024-01-29 ENCOUNTER — Encounter: Payer: Self-pay | Admitting: Family Medicine

## 2024-01-29 ENCOUNTER — Telehealth: Payer: Self-pay | Admitting: Family Medicine

## 2024-01-29 NOTE — Telephone Encounter (Signed)
 Type of form received: Results  Additional comments: Patient dropped off a sealed folder of results, they left before saying what type of results. I left the folder sealed.  Received by: Mabel Margarita  Form should be Faxed to: N/A  Form should be mailed to:  N/A  Is patient requesting call for pickup: N   Form placed:  Provider Box  Attach charge sheet. N  Individual made aware of 3-5 business day turn around (Y/N)? N

## 2024-01-30 ENCOUNTER — Ambulatory Visit
Admission: RE | Admit: 2024-01-30 | Discharge: 2024-01-30 | Disposition: A | Discharge status: Home / Self Care | Source: Ambulatory Visit | Attending: Obstetrics & Gynecology | Admitting: Obstetrics & Gynecology

## 2024-01-30 ENCOUNTER — Other Ambulatory Visit (HOSPITAL_COMMUNITY): Payer: Self-pay

## 2024-01-30 DIAGNOSIS — Z803 Family history of malignant neoplasm of breast: Secondary | ICD-10-CM

## 2024-01-30 MED ORDER — GADOPICLENOL 0.5 MMOL/ML IV SOLN
10.0000 mL | Freq: Once | INTRAVENOUS | Status: AC | PRN
  Administered 2024-01-30: 10 mL via INTRAVENOUS

## 2024-01-31 NOTE — Telephone Encounter (Signed)
 Will scan into chart

## 2024-02-02 ENCOUNTER — Other Ambulatory Visit

## 2024-03-18 ENCOUNTER — Encounter: Admitting: Family Medicine
